# Patient Record
Sex: Male | Born: 1940 | Race: White | Hispanic: No | Smoking: Current every day smoker
Health system: Southern US, Community
[De-identification: ages and names within clinical notes are randomized; demographics above are authoritative.]

## PROBLEM LIST (undated history)

## (undated) DIAGNOSIS — K219 Gastro-esophageal reflux disease without esophagitis: Secondary | ICD-10-CM

## (undated) DIAGNOSIS — R1312 Dysphagia, oropharyngeal phase: Secondary | ICD-10-CM

## (undated) DIAGNOSIS — C801 Malignant (primary) neoplasm, unspecified: Secondary | ICD-10-CM

## (undated) DIAGNOSIS — M6281 Muscle weakness (generalized): Secondary | ICD-10-CM

## (undated) DIAGNOSIS — F79 Unspecified intellectual disabilities: Secondary | ICD-10-CM

## (undated) DIAGNOSIS — C649 Malignant neoplasm of unspecified kidney, except renal pelvis: Secondary | ICD-10-CM

## (undated) DIAGNOSIS — F32A Depression, unspecified: Secondary | ICD-10-CM

## (undated) DIAGNOSIS — J449 Chronic obstructive pulmonary disease, unspecified: Secondary | ICD-10-CM

## (undated) DIAGNOSIS — D649 Anemia, unspecified: Secondary | ICD-10-CM

## (undated) DIAGNOSIS — N289 Disorder of kidney and ureter, unspecified: Secondary | ICD-10-CM

## (undated) DIAGNOSIS — L409 Psoriasis, unspecified: Secondary | ICD-10-CM

## (undated) DIAGNOSIS — A419 Sepsis, unspecified organism: Secondary | ICD-10-CM

## (undated) DIAGNOSIS — IMO0001 Reserved for inherently not codable concepts without codable children: Secondary | ICD-10-CM

## (undated) DIAGNOSIS — N390 Urinary tract infection, site not specified: Secondary | ICD-10-CM

## (undated) DIAGNOSIS — M810 Age-related osteoporosis without current pathological fracture: Secondary | ICD-10-CM

## (undated) DIAGNOSIS — R262 Difficulty in walking, not elsewhere classified: Secondary | ICD-10-CM

## (undated) DIAGNOSIS — Z931 Gastrostomy status: Secondary | ICD-10-CM

## (undated) DIAGNOSIS — F329 Major depressive disorder, single episode, unspecified: Secondary | ICD-10-CM

## (undated) HISTORY — DX: Difficulty in walking, not elsewhere classified: R26.2

## (undated) HISTORY — DX: Muscle weakness (generalized): M62.81

## (undated) HISTORY — PX: PEG PLACEMENT: SHX5437

## (undated) HISTORY — DX: Gastro-esophageal reflux disease without esophagitis: K21.9

## (undated) HISTORY — DX: Reserved for inherently not codable concepts without codable children: IMO0001

## (undated) HISTORY — DX: Dysphagia, oropharyngeal phase: R13.12

---

## 2004-12-30 ENCOUNTER — Ambulatory Visit: Payer: Self-pay | Admitting: General Surgery

## 2004-12-30 ENCOUNTER — Other Ambulatory Visit: Payer: Self-pay

## 2005-01-03 ENCOUNTER — Ambulatory Visit: Payer: Self-pay | Admitting: General Surgery

## 2005-08-04 ENCOUNTER — Ambulatory Visit: Payer: Self-pay | Admitting: Unknown Physician Specialty

## 2006-06-09 ENCOUNTER — Inpatient Hospital Stay: Payer: Self-pay | Admitting: Internal Medicine

## 2006-06-09 ENCOUNTER — Other Ambulatory Visit: Payer: Self-pay

## 2011-01-02 ENCOUNTER — Inpatient Hospital Stay: Payer: Self-pay | Admitting: Internal Medicine

## 2011-02-13 ENCOUNTER — Observation Stay: Payer: Self-pay | Admitting: Internal Medicine

## 2011-04-10 ENCOUNTER — Emergency Department: Payer: Self-pay | Admitting: Emergency Medicine

## 2013-02-28 ENCOUNTER — Inpatient Hospital Stay: Payer: Self-pay | Admitting: Internal Medicine

## 2013-02-28 LAB — CBC
HCT: 40.1 % (ref 40.0–52.0)
HGB: 13.5 g/dL (ref 13.0–18.0)
MCH: 31.3 pg (ref 26.0–34.0)
MCHC: 33.6 g/dL (ref 32.0–36.0)
Platelet: 223 10*3/uL (ref 150–440)
RBC: 4.3 10*6/uL — ABNORMAL LOW (ref 4.40–5.90)

## 2013-02-28 LAB — COMPREHENSIVE METABOLIC PANEL
Albumin: 3.4 g/dL (ref 3.4–5.0)
Alkaline Phosphatase: 133 U/L (ref 50–136)
Anion Gap: 5 — ABNORMAL LOW (ref 7–16)
BUN: 23 mg/dL — ABNORMAL HIGH (ref 7–18)
Calcium, Total: 8.9 mg/dL (ref 8.5–10.1)
Chloride: 101 mmol/L (ref 98–107)
Co2: 28 mmol/L (ref 21–32)
Creatinine: 1.7 mg/dL — ABNORMAL HIGH (ref 0.60–1.30)
EGFR (African American): 46 — ABNORMAL LOW
Potassium: 3.2 mmol/L — ABNORMAL LOW (ref 3.5–5.1)
SGOT(AST): 18 U/L (ref 15–37)
SGPT (ALT): 11 U/L — ABNORMAL LOW (ref 12–78)
Sodium: 134 mmol/L — ABNORMAL LOW (ref 136–145)

## 2013-02-28 LAB — BASIC METABOLIC PANEL
Calcium, Total: 8.3 mg/dL — ABNORMAL LOW (ref 8.5–10.1)
Creatinine: 1.47 mg/dL — ABNORMAL HIGH (ref 0.60–1.30)
EGFR (African American): 55 — ABNORMAL LOW
EGFR (Non-African Amer.): 48 — ABNORMAL LOW
Potassium: 3.8 mmol/L (ref 3.5–5.1)

## 2013-02-28 LAB — URINALYSIS, COMPLETE
Specific Gravity: 1.018 (ref 1.003–1.030)
Squamous Epithelial: NONE SEEN

## 2013-02-28 LAB — HEMOGLOBIN: HGB: 12.3 g/dL — ABNORMAL LOW (ref 13.0–18.0)

## 2013-02-28 LAB — PROTIME-INR: INR: 1

## 2013-02-28 LAB — APTT: Activated PTT: 38.5 secs — ABNORMAL HIGH (ref 23.6–35.9)

## 2013-03-01 ENCOUNTER — Ambulatory Visit: Payer: Self-pay

## 2013-03-01 LAB — BASIC METABOLIC PANEL
BUN: 17 mg/dL (ref 7–18)
Calcium, Total: 8.4 mg/dL — ABNORMAL LOW (ref 8.5–10.1)
Chloride: 101 mmol/L (ref 98–107)
Co2: 28 mmol/L (ref 21–32)
Creatinine: 1.2 mg/dL (ref 0.60–1.30)
EGFR (African American): >60
Glucose: 98 mg/dL (ref 65–99)
Osmolality: 273 (ref 275–301)
Potassium: 3.4 mmol/L — ABNORMAL LOW (ref 3.5–5.1)

## 2013-03-01 LAB — CBC WITH DIFFERENTIAL/PLATELET
Basophil #: 0.1 10*3/uL (ref 0.0–0.1)
Basophil %: 0.4 %
Eosinophil #: 0.1 10*3/uL (ref 0.0–0.7)
Eosinophil %: 1 %
HCT: 36.8 % — ABNORMAL LOW (ref 40.0–52.0)
Lymphocyte %: 11.2 %
MCH: 31.4 pg (ref 26.0–34.0)
MCHC: 33.3 g/dL (ref 32.0–36.0)
MCV: 94 fL (ref 80–100)
Monocyte %: 9.2 %
Neutrophil #: 10.7 10*3/uL — ABNORMAL HIGH (ref 1.4–6.5)
Platelet: 195 10*3/uL (ref 150–440)
RBC: 3.91 10*6/uL — ABNORMAL LOW (ref 4.40–5.90)

## 2013-03-02 LAB — CBC WITH DIFFERENTIAL/PLATELET
Basophil #: 0.1 10*3/uL (ref 0.0–0.1)
HCT: 35.5 % — ABNORMAL LOW (ref 40.0–52.0)
HGB: 12 g/dL — ABNORMAL LOW (ref 13.0–18.0)
Lymphocyte #: 1.3 10*3/uL (ref 1.0–3.6)
Lymphocyte %: 12.4 %
MCH: 31.7 pg (ref 26.0–34.0)
MCV: 94 fL (ref 80–100)
Monocyte #: 1 x10 3/mm (ref 0.2–1.0)
Monocyte %: 9.3 %
Neutrophil #: 8.1 10*3/uL — ABNORMAL HIGH (ref 1.4–6.5)
Platelet: 217 10*3/uL (ref 150–440)
RDW: 16.3 % — ABNORMAL HIGH (ref 11.5–14.5)

## 2013-03-02 LAB — BASIC METABOLIC PANEL
Anion Gap: 8 (ref 7–16)
BUN: 16 mg/dL (ref 7–18)
Calcium, Total: 8.3 mg/dL — ABNORMAL LOW (ref 8.5–10.1)
Chloride: 106 mmol/L (ref 98–107)
Co2: 23 mmol/L (ref 21–32)
Creatinine: 1.1 mg/dL (ref 0.60–1.30)
EGFR (Non-African Amer.): >60
Glucose: 106 mg/dL — ABNORMAL HIGH (ref 65–99)
Potassium: 3.4 mmol/L — ABNORMAL LOW (ref 3.5–5.1)
Sodium: 137 mmol/L (ref 136–145)

## 2013-03-02 LAB — URINE CULTURE

## 2013-03-02 LAB — OCCULT BLOOD X 1 CARD TO LAB, STOOL: Occult Blood, Feces: NEGATIVE

## 2013-06-21 ENCOUNTER — Other Ambulatory Visit: Payer: Self-pay | Admitting: Family Medicine

## 2013-06-21 LAB — SEDIMENTATION RATE: Erythrocyte Sed Rate: 66 mm/hr — ABNORMAL HIGH (ref 0–20)

## 2013-06-26 ENCOUNTER — Encounter: Payer: Self-pay | Admitting: Podiatry

## 2013-07-11 ENCOUNTER — Encounter: Payer: Self-pay | Admitting: Podiatry

## 2013-07-14 ENCOUNTER — Other Ambulatory Visit: Payer: Self-pay | Admitting: Family Medicine

## 2013-07-14 ENCOUNTER — Ambulatory Visit (INDEPENDENT_AMBULATORY_CARE_PROVIDER_SITE_OTHER): Payer: Medicare Other | Admitting: Podiatry

## 2013-07-14 ENCOUNTER — Encounter: Payer: Self-pay | Admitting: Podiatry

## 2013-07-14 VITALS — BP 104/62 | HR 96 | Resp 16 | Ht 71.0 in

## 2013-07-14 DIAGNOSIS — B351 Tinea unguium: Secondary | ICD-10-CM

## 2013-07-14 DIAGNOSIS — M79609 Pain in unspecified limb: Secondary | ICD-10-CM

## 2013-07-14 LAB — CBC WITH DIFFERENTIAL/PLATELET
BASOS PCT: 1.1 %
Basophil #: 0.1 10*3/uL (ref 0.0–0.1)
EOS ABS: 0.4 10*3/uL (ref 0.0–0.7)
Eosinophil %: 3.6 %
HCT: 29.7 % — AB (ref 40.0–52.0)
HGB: 9.5 g/dL — AB (ref 13.0–18.0)
LYMPHS ABS: 2.2 10*3/uL (ref 1.0–3.6)
LYMPHS PCT: 18.2 %
MCH: 28.5 pg (ref 26.0–34.0)
MCHC: 32.2 g/dL (ref 32.0–36.0)
MCV: 89 fL (ref 80–100)
MONO ABS: 0.8 x10 3/mm (ref 0.2–1.0)
Monocyte %: 6.7 %
Neutrophil #: 8.4 10*3/uL — ABNORMAL HIGH (ref 1.4–6.5)
Neutrophil %: 70.4 %
Platelet: 348 10*3/uL (ref 150–440)
RBC: 3.34 10*6/uL — ABNORMAL LOW (ref 4.40–5.90)
RDW: 17.3 % — ABNORMAL HIGH (ref 11.5–14.5)
WBC: 11.9 10*3/uL — ABNORMAL HIGH (ref 3.8–10.6)

## 2013-07-14 LAB — COMPREHENSIVE METABOLIC PANEL
ALBUMIN: 2.7 g/dL — AB (ref 3.4–5.0)
ALK PHOS: 133 U/L — AB
Anion Gap: 3 — ABNORMAL LOW (ref 7–16)
BILIRUBIN TOTAL: 0.4 mg/dL (ref 0.2–1.0)
BUN: 33 mg/dL — AB (ref 7–18)
CHLORIDE: 102 mmol/L (ref 98–107)
CO2: 30 mmol/L (ref 21–32)
CREATININE: 1.16 mg/dL (ref 0.60–1.30)
Calcium, Total: 8.3 mg/dL — ABNORMAL LOW (ref 8.5–10.1)
EGFR (African American): >60
GLUCOSE: 84 mg/dL (ref 65–99)
OSMOLALITY: 277 (ref 275–301)
Potassium: 4.3 mmol/L (ref 3.5–5.1)
SGOT(AST): 17 U/L (ref 15–37)
SGPT (ALT): 19 U/L (ref 12–78)
Sodium: 135 mmol/L — ABNORMAL LOW (ref 136–145)
Total Protein: 6.7 g/dL (ref 6.4–8.2)

## 2013-07-14 LAB — SEDIMENTATION RATE: Erythrocyte Sed Rate: 87 mm/hr — ABNORMAL HIGH (ref 0–20)

## 2013-07-14 NOTE — Progress Notes (Signed)
He presents today with a chief complaint of painful elongated toenails bilateral.  Objective: Vital signs are stable he is alert and oriented x3. Pulses are palpable bilateral. Nails are thick yellow dystrophic with mycotic and painful palpation.  Assessment: Pain in limb secondary to onychomycosis 1 through 5 bilateral.  Plan: Debridement of nails 1 through 5 bilateral covered service secondary to pain.

## 2013-08-21 ENCOUNTER — Emergency Department: Payer: Self-pay | Admitting: Emergency Medicine

## 2013-08-21 LAB — COMPREHENSIVE METABOLIC PANEL
ALK PHOS: 108 U/L
ALT: 14 U/L (ref 12–78)
Albumin: 2.6 g/dL — ABNORMAL LOW (ref 3.4–5.0)
Anion Gap: 2 — ABNORMAL LOW (ref 7–16)
BUN: 42 mg/dL — ABNORMAL HIGH (ref 7–18)
Bilirubin,Total: 0.5 mg/dL (ref 0.2–1.0)
CHLORIDE: 111 mmol/L — AB (ref 98–107)
CO2: 29 mmol/L (ref 21–32)
CREATININE: 1.17 mg/dL (ref 0.60–1.30)
Calcium, Total: 8.9 mg/dL (ref 8.5–10.1)
EGFR (African American): >60
EGFR (Non-African Amer.): >60
Glucose: 99 mg/dL (ref 65–99)
Osmolality: 294 (ref 275–301)
POTASSIUM: 4.1 mmol/L (ref 3.5–5.1)
SGOT(AST): 15 U/L (ref 15–37)
Sodium: 142 mmol/L (ref 136–145)
Total Protein: 7.1 g/dL (ref 6.4–8.2)

## 2013-08-21 LAB — PROTIME-INR
INR: 1.1
PROTHROMBIN TIME: 14.5 s (ref 11.5–14.7)

## 2013-08-21 LAB — CBC
HCT: 31.4 % — AB (ref 40.0–52.0)
HGB: 10 g/dL — ABNORMAL LOW (ref 13.0–18.0)
MCH: 27.9 pg (ref 26.0–34.0)
MCHC: 31.7 g/dL — ABNORMAL LOW (ref 32.0–36.0)
MCV: 88 fL (ref 80–100)
Platelet: 216 10*3/uL (ref 150–440)
RBC: 3.57 10*6/uL — AB (ref 4.40–5.90)
RDW: 16.4 % — ABNORMAL HIGH (ref 11.5–14.5)
WBC: 13.1 10*3/uL — ABNORMAL HIGH (ref 3.8–10.6)

## 2013-08-23 ENCOUNTER — Inpatient Hospital Stay: Payer: Self-pay | Admitting: Specialist

## 2013-08-23 LAB — CBC
HCT: 30.6 % — AB (ref 40.0–52.0)
HGB: 9.6 g/dL — ABNORMAL LOW (ref 13.0–18.0)
MCH: 27.4 pg (ref 26.0–34.0)
MCHC: 31.5 g/dL — ABNORMAL LOW (ref 32.0–36.0)
MCV: 87 fL (ref 80–100)
Platelet: 235 10*3/uL (ref 150–440)
RBC: 3.52 10*6/uL — ABNORMAL LOW (ref 4.40–5.90)
RDW: 16.7 % — ABNORMAL HIGH (ref 11.5–14.5)
WBC: 11.2 10*3/uL — ABNORMAL HIGH (ref 3.8–10.6)

## 2013-08-23 LAB — URINALYSIS, COMPLETE
Bilirubin,UR: NEGATIVE
Blood: NEGATIVE
Glucose,UR: NEGATIVE mg/dL (ref 0–75)
Nitrite: NEGATIVE
Ph: 5 (ref 4.5–8.0)
Protein: 100
RBC,UR: 1315 /HPF (ref 0–5)
Specific Gravity: 1.018 (ref 1.003–1.030)
Squamous Epithelial: NONE SEEN
WBC UR: 10568 /HPF (ref 0–5)

## 2013-08-23 LAB — BASIC METABOLIC PANEL
ANION GAP: 5 — AB (ref 7–16)
BUN: 39 mg/dL — ABNORMAL HIGH (ref 7–18)
CO2: 26 mmol/L (ref 21–32)
Calcium, Total: 8.6 mg/dL (ref 8.5–10.1)
Chloride: 111 mmol/L — ABNORMAL HIGH (ref 98–107)
Creatinine: 1.12 mg/dL (ref 0.60–1.30)
EGFR (Non-African Amer.): >60
GLUCOSE: 104 mg/dL — AB (ref 65–99)
OSMOLALITY: 293 (ref 275–301)
POTASSIUM: 3.8 mmol/L (ref 3.5–5.1)
SODIUM: 142 mmol/L (ref 136–145)

## 2013-08-24 ENCOUNTER — Ambulatory Visit: Payer: Self-pay | Admitting: Urology

## 2013-08-24 LAB — MAGNESIUM: Magnesium: 2 mg/dL

## 2013-08-24 LAB — CBC WITH DIFFERENTIAL/PLATELET
BASOS ABS: 0.1 10*3/uL (ref 0.0–0.1)
Basophil %: 0.7 %
Eosinophil #: 0.7 10*3/uL (ref 0.0–0.7)
Eosinophil %: 7.9 %
HCT: 28.6 % — ABNORMAL LOW (ref 40.0–52.0)
HGB: 9 g/dL — ABNORMAL LOW (ref 13.0–18.0)
Lymphocyte #: 2.3 10*3/uL (ref 1.0–3.6)
Lymphocyte %: 24.5 %
MCH: 27.4 pg (ref 26.0–34.0)
MCHC: 31.5 g/dL — ABNORMAL LOW (ref 32.0–36.0)
MCV: 87 fL (ref 80–100)
MONOS PCT: 5.8 %
Monocyte #: 0.5 x10 3/mm (ref 0.2–1.0)
NEUTROS PCT: 61.1 %
Neutrophil #: 5.7 10*3/uL (ref 1.4–6.5)
Platelet: 220 10*3/uL (ref 150–440)
RBC: 3.29 10*6/uL — ABNORMAL LOW (ref 4.40–5.90)
RDW: 16.8 % — ABNORMAL HIGH (ref 11.5–14.5)
WBC: 9.3 10*3/uL (ref 3.8–10.6)

## 2013-08-24 LAB — BASIC METABOLIC PANEL
ANION GAP: 5 — AB (ref 7–16)
BUN: 30 mg/dL — ABNORMAL HIGH (ref 7–18)
CALCIUM: 8.1 mg/dL — AB (ref 8.5–10.1)
CHLORIDE: 113 mmol/L — AB (ref 98–107)
Co2: 26 mmol/L (ref 21–32)
Creatinine: 0.9 mg/dL (ref 0.60–1.30)
EGFR (Non-African Amer.): >60
Glucose: 91 mg/dL (ref 65–99)
OSMOLALITY: 293 (ref 275–301)
Potassium: 3.7 mmol/L (ref 3.5–5.1)
SODIUM: 144 mmol/L (ref 136–145)

## 2013-08-25 LAB — URINE CULTURE

## 2013-08-28 LAB — CULTURE, BLOOD (SINGLE)

## 2013-10-09 ENCOUNTER — Ambulatory Visit: Payer: Self-pay | Admitting: Internal Medicine

## 2013-10-25 ENCOUNTER — Emergency Department: Payer: Self-pay | Admitting: Emergency Medicine

## 2014-01-05 ENCOUNTER — Ambulatory Visit: Payer: Medicare Other | Admitting: Podiatry

## 2014-02-05 ENCOUNTER — Encounter: Payer: Self-pay | Admitting: Podiatry

## 2014-02-05 ENCOUNTER — Ambulatory Visit (INDEPENDENT_AMBULATORY_CARE_PROVIDER_SITE_OTHER): Payer: Commercial Managed Care - HMO | Admitting: Podiatry

## 2014-02-05 DIAGNOSIS — B351 Tinea unguium: Secondary | ICD-10-CM | POA: Diagnosis not present

## 2014-02-05 DIAGNOSIS — M79609 Pain in unspecified limb: Secondary | ICD-10-CM | POA: Diagnosis not present

## 2014-02-05 DIAGNOSIS — M79676 Pain in unspecified toe(s): Secondary | ICD-10-CM

## 2014-02-05 NOTE — Progress Notes (Signed)
   Subjective:    Patient ID: Drago Hammonds, male    DOB: 11-01-1940, 73 y.o.   MRN: [id number]  HPI Comments: Presents today chief complaint of painful elongated toenails.  Objective: Pulses are palpable bilateral nails are thick, yellow dystrophic onychomycosis and painful palpation.   Assessment: Onychomycosis with pain in limb.  Plan: Treatment of nails in thickness and length as covered service secondary to pain.      Review of Systems     Objective:   Physical Exam        Assessment & Plan:

## 2014-02-09 ENCOUNTER — Inpatient Hospital Stay: Payer: Self-pay | Admitting: Internal Medicine

## 2014-02-09 LAB — COMPREHENSIVE METABOLIC PANEL
ALBUMIN: 3.8 g/dL (ref 3.4–5.0)
ALK PHOS: 139 U/L — AB
ALT: 20 U/L
AST: 17 U/L (ref 15–37)
Anion Gap: 9 (ref 7–16)
BUN: 39 mg/dL — ABNORMAL HIGH (ref 7–18)
Bilirubin,Total: 0.7 mg/dL (ref 0.2–1.0)
CHLORIDE: 105 mmol/L (ref 98–107)
Calcium, Total: 9.6 mg/dL (ref 8.5–10.1)
Co2: 24 mmol/L (ref 21–32)
Creatinine: 1.55 mg/dL — ABNORMAL HIGH (ref 0.60–1.30)
EGFR (African American): 57 — ABNORMAL LOW
EGFR (Non-African Amer.): 47 — ABNORMAL LOW
Glucose: 119 mg/dL — ABNORMAL HIGH (ref 65–99)
OSMOLALITY: 286 (ref 275–301)
POTASSIUM: 4.4 mmol/L (ref 3.5–5.1)
Sodium: 138 mmol/L (ref 136–145)
Total Protein: 8.3 g/dL — ABNORMAL HIGH (ref 6.4–8.2)

## 2014-02-09 LAB — CBC WITH DIFFERENTIAL/PLATELET
BASOS ABS: 0.1 10*3/uL (ref 0.0–0.1)
Basophil %: 0.4 %
EOS PCT: 0.3 %
Eosinophil #: 0 10*3/uL (ref 0.0–0.7)
HCT: 47.5 % (ref 40.0–52.0)
HGB: 15 g/dL (ref 13.0–18.0)
LYMPHS ABS: 1.3 10*3/uL (ref 1.0–3.6)
LYMPHS PCT: 9.3 %
MCH: 31 pg (ref 26.0–34.0)
MCHC: 31.5 g/dL — ABNORMAL LOW (ref 32.0–36.0)
MCV: 99 fL (ref 80–100)
MONOS PCT: 4 %
Monocyte #: 0.6 x10 3/mm (ref 0.2–1.0)
Neutrophil #: 12.2 10*3/uL — ABNORMAL HIGH (ref 1.4–6.5)
Neutrophil %: 86 %
PLATELETS: 188 10*3/uL (ref 150–440)
RBC: 4.83 10*6/uL (ref 4.40–5.90)
RDW: 13.9 % (ref 11.5–14.5)
WBC: 14.2 10*3/uL — AB (ref 3.8–10.6)

## 2014-02-09 LAB — URINALYSIS, COMPLETE
Bilirubin,UR: NEGATIVE
GLUCOSE, UR: NEGATIVE mg/dL (ref 0–75)
KETONE: NEGATIVE
Nitrite: NEGATIVE
Ph: 7 (ref 4.5–8.0)
RBC,UR: 3006 /HPF (ref 0–5)
SPECIFIC GRAVITY: 1.016 (ref 1.003–1.030)
Squamous Epithelial: NONE SEEN

## 2014-02-09 LAB — PHOSPHORUS: PHOSPHORUS: 2.7 mg/dL (ref 2.5–4.9)

## 2014-02-09 LAB — PROTIME-INR
INR: 1
PROTHROMBIN TIME: 13.3 s (ref 11.5–14.7)

## 2014-02-09 LAB — MAGNESIUM: Magnesium: 1.9 mg/dL

## 2014-02-09 LAB — TROPONIN I

## 2014-02-10 LAB — CBC WITH DIFFERENTIAL/PLATELET
Basophil #: 0.1 10*3/uL (ref 0.0–0.1)
Basophil %: 0.6 %
Eosinophil #: 0.2 10*3/uL (ref 0.0–0.7)
Eosinophil %: 2.3 %
HCT: 38.7 % — ABNORMAL LOW (ref 40.0–52.0)
HGB: 13 g/dL (ref 13.0–18.0)
LYMPHS ABS: 1.3 10*3/uL (ref 1.0–3.6)
LYMPHS PCT: 12.3 %
MCH: 32.8 pg (ref 26.0–34.0)
MCHC: 33.6 g/dL (ref 32.0–36.0)
MCV: 98 fL (ref 80–100)
Monocyte #: 0.8 x10 3/mm (ref 0.2–1.0)
Monocyte %: 7.4 %
Neutrophil #: 8 10*3/uL — ABNORMAL HIGH (ref 1.4–6.5)
Neutrophil %: 77.4 %
PLATELETS: 147 10*3/uL — AB (ref 150–440)
RBC: 3.96 10*6/uL — ABNORMAL LOW (ref 4.40–5.90)
RDW: 13.8 % (ref 11.5–14.5)
WBC: 10.3 10*3/uL (ref 3.8–10.6)

## 2014-02-10 LAB — BASIC METABOLIC PANEL
ANION GAP: 6 — AB (ref 7–16)
BUN: 28 mg/dL — ABNORMAL HIGH (ref 7–18)
Calcium, Total: 8.1 mg/dL — ABNORMAL LOW (ref 8.5–10.1)
Chloride: 112 mmol/L — ABNORMAL HIGH (ref 98–107)
Co2: 25 mmol/L (ref 21–32)
Creatinine: 1.16 mg/dL (ref 0.60–1.30)
EGFR (Non-African Amer.): >60
Glucose: 91 mg/dL (ref 65–99)
OSMOLALITY: 290 (ref 275–301)
Potassium: 3.7 mmol/L (ref 3.5–5.1)
Sodium: 143 mmol/L (ref 136–145)

## 2014-02-11 LAB — CBC WITH DIFFERENTIAL/PLATELET
BASOS PCT: 0.8 %
Basophil #: 0.1 10*3/uL (ref 0.0–0.1)
EOS ABS: 0.4 10*3/uL (ref 0.0–0.7)
Eosinophil %: 5.2 %
HCT: 37.9 % — AB (ref 40.0–52.0)
HGB: 12.8 g/dL — ABNORMAL LOW (ref 13.0–18.0)
LYMPHS ABS: 1.6 10*3/uL (ref 1.0–3.6)
Lymphocyte %: 19.3 %
MCH: 32.7 pg (ref 26.0–34.0)
MCHC: 33.8 g/dL (ref 32.0–36.0)
MCV: 97 fL (ref 80–100)
Monocyte #: 0.6 x10 3/mm (ref 0.2–1.0)
Monocyte %: 7.7 %
NEUTROS ABS: 5.6 10*3/uL (ref 1.4–6.5)
Neutrophil %: 67 %
Platelet: 135 10*3/uL — ABNORMAL LOW (ref 150–440)
RBC: 3.91 10*6/uL — ABNORMAL LOW (ref 4.40–5.90)
RDW: 13.7 % (ref 11.5–14.5)
WBC: 8.3 10*3/uL (ref 3.8–10.6)

## 2014-02-11 LAB — BASIC METABOLIC PANEL
Anion Gap: 7 (ref 7–16)
BUN: 27 mg/dL — AB (ref 7–18)
CREATININE: 1.05 mg/dL (ref 0.60–1.30)
Calcium, Total: 8 mg/dL — ABNORMAL LOW (ref 8.5–10.1)
Chloride: 110 mmol/L — ABNORMAL HIGH (ref 98–107)
Co2: 22 mmol/L (ref 21–32)
Glucose: 92 mg/dL (ref 65–99)
Osmolality: 282 (ref 275–301)
Potassium: 3.8 mmol/L (ref 3.5–5.1)
Sodium: 139 mmol/L (ref 136–145)

## 2014-02-11 LAB — URINE CULTURE

## 2014-02-14 LAB — CULTURE, BLOOD (SINGLE)

## 2014-03-05 ENCOUNTER — Emergency Department: Payer: Self-pay | Admitting: Emergency Medicine

## 2014-03-05 LAB — CBC WITH DIFFERENTIAL/PLATELET
Basophil #: 0.1 10*3/uL (ref 0.0–0.1)
Basophil %: 0.4 %
Eosinophil #: 0.1 10*3/uL (ref 0.0–0.7)
Eosinophil %: 0.7 %
HCT: 41.4 % (ref 40.0–52.0)
HGB: 13.6 g/dL (ref 13.0–18.0)
LYMPHS ABS: 1 10*3/uL (ref 1.0–3.6)
LYMPHS PCT: 7.6 %
MCH: 32.2 pg (ref 26.0–34.0)
MCHC: 32.9 g/dL (ref 32.0–36.0)
MCV: 98 fL (ref 80–100)
Monocyte #: 1 x10 3/mm (ref 0.2–1.0)
Monocyte %: 7.6 %
NEUTROS PCT: 83.7 %
Neutrophil #: 11.5 10*3/uL — ABNORMAL HIGH (ref 1.4–6.5)
PLATELETS: 168 10*3/uL (ref 150–440)
RBC: 4.22 10*6/uL — AB (ref 4.40–5.90)
RDW: 13.9 % (ref 11.5–14.5)
WBC: 13.7 10*3/uL — ABNORMAL HIGH (ref 3.8–10.6)

## 2014-03-05 LAB — BASIC METABOLIC PANEL
ANION GAP: 10 (ref 7–16)
BUN: 40 mg/dL — ABNORMAL HIGH (ref 7–18)
CHLORIDE: 111 mmol/L — AB (ref 98–107)
CO2: 25 mmol/L (ref 21–32)
CREATININE: 1.6 mg/dL — AB (ref 0.60–1.30)
Calcium, Total: 9.6 mg/dL (ref 8.5–10.1)
EGFR (Non-African Amer.): 46 — ABNORMAL LOW
GFR CALC AF AMER: 55 — AB
GLUCOSE: 117 mg/dL — AB (ref 65–99)
OSMOLALITY: 301 (ref 275–301)
Potassium: 4 mmol/L (ref 3.5–5.1)
SODIUM: 146 mmol/L — AB (ref 136–145)

## 2014-03-05 LAB — URINALYSIS, COMPLETE
BILIRUBIN, UR: NEGATIVE
Blood: NEGATIVE
GLUCOSE, UR: NEGATIVE mg/dL (ref 0–75)
Ketone: NEGATIVE
NITRITE: NEGATIVE
PH: 7 (ref 4.5–8.0)
Protein: 30
SPECIFIC GRAVITY: 1.015 (ref 1.003–1.030)
SQUAMOUS EPITHELIAL: NONE SEEN
WBC UR: 98 /HPF (ref 0–5)

## 2014-05-06 ENCOUNTER — Other Ambulatory Visit: Payer: Medicare Other

## 2014-05-06 ENCOUNTER — Ambulatory Visit: Payer: Medicare Other | Admitting: Podiatry

## 2014-05-13 ENCOUNTER — Emergency Department: Payer: Self-pay | Admitting: Internal Medicine

## 2014-05-13 LAB — COMPREHENSIVE METABOLIC PANEL
ANION GAP: 5 — AB (ref 7–16)
Albumin: 3.1 g/dL — ABNORMAL LOW (ref 3.4–5.0)
Alkaline Phosphatase: 117 U/L — ABNORMAL HIGH
BILIRUBIN TOTAL: 0.5 mg/dL (ref 0.2–1.0)
BUN: 37 mg/dL — AB (ref 7–18)
CALCIUM: 9 mg/dL (ref 8.5–10.1)
CO2: 29 mmol/L (ref 21–32)
CREATININE: 1.18 mg/dL (ref 0.60–1.30)
Chloride: 109 mmol/L — ABNORMAL HIGH (ref 98–107)
EGFR (Non-African Amer.): >60
Glucose: 99 mg/dL (ref 65–99)
OSMOLALITY: 294 (ref 275–301)
POTASSIUM: 4.2 mmol/L (ref 3.5–5.1)
SGOT(AST): 19 U/L (ref 15–37)
SGPT (ALT): 18 U/L
Sodium: 143 mmol/L (ref 136–145)
TOTAL PROTEIN: 7.3 g/dL (ref 6.4–8.2)

## 2014-05-13 LAB — PROTIME-INR
INR: 1
Prothrombin Time: 12.9 secs (ref 11.5–14.7)

## 2014-05-13 LAB — URINALYSIS, COMPLETE
Bilirubin,UR: NEGATIVE
Glucose,UR: NEGATIVE mg/dL (ref 0–75)
Ketone: NEGATIVE
NITRITE: NEGATIVE
Ph: 7 (ref 4.5–8.0)
Protein: 100
RBC,UR: 7724 /HPF (ref 0–5)
Specific Gravity: 1.019 (ref 1.003–1.030)
WBC UR: 188 /HPF (ref 0–5)

## 2014-05-13 LAB — CBC
HCT: 42.6 % (ref 40.0–52.0)
HGB: 14 g/dL (ref 13.0–18.0)
MCH: 33.3 pg (ref 26.0–34.0)
MCHC: 32.8 g/dL (ref 32.0–36.0)
MCV: 102 fL — AB (ref 80–100)
Platelet: 189 10*3/uL (ref 150–440)
RBC: 4.19 10*6/uL — ABNORMAL LOW (ref 4.40–5.90)
RDW: 13.4 % (ref 11.5–14.5)
WBC: 9.6 10*3/uL (ref 3.8–10.6)

## 2014-05-25 ENCOUNTER — Other Ambulatory Visit: Payer: Medicare Other

## 2014-06-10 ENCOUNTER — Emergency Department: Payer: Self-pay | Admitting: Emergency Medicine

## 2014-06-10 LAB — URINALYSIS, COMPLETE
BILIRUBIN, UR: NEGATIVE
Glucose,UR: NEGATIVE mg/dL (ref 0–75)
KETONE: NEGATIVE
Nitrite: NEGATIVE
PH: 6 (ref 4.5–8.0)
Protein: 30
RBC,UR: 4 /HPF (ref 0–5)
Specific Gravity: 1.009 (ref 1.003–1.030)
Squamous Epithelial: NONE SEEN
WBC UR: 57 /HPF (ref 0–5)

## 2014-06-12 LAB — URINE CULTURE

## 2014-06-14 ENCOUNTER — Inpatient Hospital Stay: Payer: Self-pay | Admitting: Internal Medicine

## 2014-06-14 LAB — COMPREHENSIVE METABOLIC PANEL
Albumin: 3.2 g/dL — ABNORMAL LOW (ref 3.4–5.0)
Alkaline Phosphatase: 116 U/L (ref 46–116)
Anion Gap: 9 (ref 7–16)
BILIRUBIN TOTAL: 0.6 mg/dL (ref 0.2–1.0)
BUN: 39 mg/dL — AB (ref 7–18)
CHLORIDE: 108 mmol/L — AB (ref 98–107)
CO2: 24 mmol/L (ref 21–32)
CREATININE: 1.57 mg/dL — AB (ref 0.60–1.30)
Calcium, Total: 9.5 mg/dL (ref 8.5–10.1)
GFR CALC AF AMER: 56 — AB
GFR CALC NON AF AMER: 46 — AB
Glucose: 110 mg/dL — ABNORMAL HIGH (ref 65–99)
OSMOLALITY: 291 (ref 275–301)
POTASSIUM: 3.9 mmol/L (ref 3.5–5.1)
SGOT(AST): 16 U/L (ref 15–37)
SGPT (ALT): 15 U/L (ref 14–63)
SODIUM: 141 mmol/L (ref 136–145)
Total Protein: 7.5 g/dL (ref 6.4–8.2)

## 2014-06-14 LAB — CBC
HCT: 41.8 % (ref 40.0–52.0)
HGB: 14 g/dL (ref 13.0–18.0)
MCH: 34.2 pg — ABNORMAL HIGH (ref 26.0–34.0)
MCHC: 33.6 g/dL (ref 32.0–36.0)
MCV: 102 fL — ABNORMAL HIGH (ref 80–100)
Platelet: 170 10*3/uL (ref 150–440)
RBC: 4.1 10*6/uL — ABNORMAL LOW (ref 4.40–5.90)
RDW: 13.7 % (ref 11.5–14.5)
WBC: 6.7 10*3/uL (ref 3.8–10.6)

## 2014-06-14 LAB — URINALYSIS, COMPLETE
BACTERIA: NONE SEEN
RBC,UR: 83340 /HPF (ref 0–5)
Specific Gravity: 1.02 (ref 1.003–1.030)
Squamous Epithelial: 14

## 2014-06-14 LAB — LIPASE, BLOOD: Lipase: 229 U/L (ref 73–393)

## 2014-06-14 LAB — TROPONIN I
TROPONIN-I: 1.3 ng/mL — AB
Troponin-I: 0.11 ng/mL — ABNORMAL HIGH

## 2014-06-14 LAB — HEMOGLOBIN: HGB: 11.6 g/dL — ABNORMAL LOW (ref 13.0–18.0)

## 2014-06-14 LAB — CK-MB: CK-MB: 2.3 ng/mL (ref 0.5–3.6)

## 2014-06-15 LAB — CBC WITH DIFFERENTIAL/PLATELET
BASOS ABS: 0.1 10*3/uL (ref 0.0–0.1)
Basophil %: 0.2 %
EOS ABS: 0.1 10*3/uL (ref 0.0–0.7)
Eosinophil %: 0.2 %
HCT: 33.3 % — ABNORMAL LOW (ref 40.0–52.0)
HGB: 11 g/dL — ABNORMAL LOW (ref 13.0–18.0)
LYMPHS ABS: 1.3 10*3/uL (ref 1.0–3.6)
Lymphocyte %: 4.7 %
MCH: 33.1 pg (ref 26.0–34.0)
MCHC: 32.9 g/dL (ref 32.0–36.0)
MCV: 101 fL — ABNORMAL HIGH (ref 80–100)
Monocyte #: 1.7 x10 3/mm — ABNORMAL HIGH (ref 0.2–1.0)
Monocyte %: 6.4 %
NEUTROS PCT: 88.5 %
Neutrophil #: 23.6 10*3/uL — ABNORMAL HIGH (ref 1.4–6.5)
PLATELETS: 146 10*3/uL — AB (ref 150–440)
RBC: 3.31 10*6/uL — ABNORMAL LOW (ref 4.40–5.90)
RDW: 13.9 % (ref 11.5–14.5)
WBC: 26.7 10*3/uL — AB (ref 3.8–10.6)

## 2014-06-15 LAB — BASIC METABOLIC PANEL
Anion Gap: 8 (ref 7–16)
BUN: 44 mg/dL — ABNORMAL HIGH (ref 7–18)
CHLORIDE: 115 mmol/L — AB (ref 98–107)
Calcium, Total: 8.2 mg/dL — ABNORMAL LOW (ref 8.5–10.1)
Co2: 23 mmol/L (ref 21–32)
Creatinine: 1.51 mg/dL — ABNORMAL HIGH (ref 0.60–1.30)
EGFR (African American): 59 — ABNORMAL LOW
GFR CALC NON AF AMER: 49 — AB
Glucose: 98 mg/dL (ref 65–99)
OSMOLALITY: 302 (ref 275–301)
Potassium: 4.3 mmol/L (ref 3.5–5.1)
Sodium: 146 mmol/L — ABNORMAL HIGH (ref 136–145)

## 2014-06-15 LAB — TROPONIN I: Troponin-I: 1.5 ng/mL — ABNORMAL HIGH

## 2014-06-15 LAB — CK-MB
CK-MB: 3.6 ng/mL (ref 0.5–3.6)
CK-MB: 3.7 ng/mL — AB (ref 0.5–3.6)

## 2014-06-16 LAB — CBC WITH DIFFERENTIAL/PLATELET
BASOS PCT: 0.3 %
Basophil #: 0 10*3/uL (ref 0.0–0.1)
EOS PCT: 1.6 %
Eosinophil #: 0.3 10*3/uL (ref 0.0–0.7)
HCT: 33.4 % — ABNORMAL LOW (ref 40.0–52.0)
HGB: 11.1 g/dL — AB (ref 13.0–18.0)
LYMPHS ABS: 1.2 10*3/uL (ref 1.0–3.6)
Lymphocyte %: 6.8 %
MCH: 33.6 pg (ref 26.0–34.0)
MCHC: 33.2 g/dL (ref 32.0–36.0)
MCV: 101 fL — AB (ref 80–100)
MONO ABS: 1 x10 3/mm (ref 0.2–1.0)
Monocyte %: 5.5 %
NEUTROS ABS: 15.4 10*3/uL — AB (ref 1.4–6.5)
Neutrophil %: 85.8 %
Platelet: 134 10*3/uL — ABNORMAL LOW (ref 150–440)
RBC: 3.3 10*6/uL — AB (ref 4.40–5.90)
RDW: 13.8 % (ref 11.5–14.5)
WBC: 18 10*3/uL — ABNORMAL HIGH (ref 3.8–10.6)

## 2014-06-16 LAB — BASIC METABOLIC PANEL
Anion Gap: 11 (ref 7–16)
BUN: 36 mg/dL — AB (ref 7–18)
CALCIUM: 8.2 mg/dL — AB (ref 8.5–10.1)
CHLORIDE: 112 mmol/L — AB (ref 98–107)
CO2: 22 mmol/L (ref 21–32)
Creatinine: 1.42 mg/dL — ABNORMAL HIGH (ref 0.60–1.30)
EGFR (Non-African Amer.): 52 — ABNORMAL LOW
Glucose: 87 mg/dL (ref 65–99)
Osmolality: 296 (ref 275–301)
Potassium: 4 mmol/L (ref 3.5–5.1)
SODIUM: 145 mmol/L (ref 136–145)

## 2014-06-17 LAB — CBC WITH DIFFERENTIAL/PLATELET
BASOS ABS: 0 10*3/uL (ref 0.0–0.1)
Basophil %: 0.3 %
Eosinophil #: 0.2 10*3/uL (ref 0.0–0.7)
Eosinophil %: 2.2 %
HCT: 34.2 % — ABNORMAL LOW (ref 40.0–52.0)
HGB: 11.7 g/dL — ABNORMAL LOW (ref 13.0–18.0)
LYMPHS ABS: 1.4 10*3/uL (ref 1.0–3.6)
Lymphocyte %: 13.1 %
MCH: 34.2 pg — AB (ref 26.0–34.0)
MCHC: 34.2 g/dL (ref 32.0–36.0)
MCV: 100 fL (ref 80–100)
Monocyte #: 0.7 x10 3/mm (ref 0.2–1.0)
Monocyte %: 6.2 %
NEUTROS ABS: 8.5 10*3/uL — AB (ref 1.4–6.5)
Neutrophil %: 78.2 %
PLATELETS: 149 10*3/uL — AB (ref 150–440)
RBC: 3.42 10*6/uL — ABNORMAL LOW (ref 4.40–5.90)
RDW: 13.4 % (ref 11.5–14.5)
WBC: 10.9 10*3/uL — ABNORMAL HIGH (ref 3.8–10.6)

## 2014-06-17 LAB — BASIC METABOLIC PANEL
Anion Gap: 9 (ref 7–16)
BUN: 26 mg/dL — AB (ref 7–18)
CHLORIDE: 112 mmol/L — AB (ref 98–107)
CREATININE: 1.24 mg/dL (ref 0.60–1.30)
Calcium, Total: 8.2 mg/dL — ABNORMAL LOW (ref 8.5–10.1)
Co2: 20 mmol/L — ABNORMAL LOW (ref 21–32)
GLUCOSE: 102 mg/dL — AB (ref 65–99)
Osmolality: 286 (ref 275–301)
POTASSIUM: 3.9 mmol/L (ref 3.5–5.1)
Sodium: 141 mmol/L (ref 136–145)

## 2014-07-06 ENCOUNTER — Ambulatory Visit: Payer: Self-pay | Admitting: Internal Medicine

## 2014-08-13 ENCOUNTER — Ambulatory Visit (INDEPENDENT_AMBULATORY_CARE_PROVIDER_SITE_OTHER): Payer: Commercial Managed Care - HMO | Admitting: Podiatry

## 2014-08-13 DIAGNOSIS — B351 Tinea unguium: Secondary | ICD-10-CM

## 2014-08-13 DIAGNOSIS — M79676 Pain in unspecified toe(s): Secondary | ICD-10-CM

## 2014-08-13 NOTE — Progress Notes (Signed)
Patient ID: Matthew Poole, male   DOB: 04/07/1941, 74 y.o.   MRN: [id number]  Subjective: 74 y.o.-year-old male returns the office today for painful, elongated, thickened toenails which he is unable to trim himself. Denies any redness or drainage around the nails. Denies any acute changes since last appointment and no new complaints today. Denies any systemic complaints such as fevers, chills, nausea, vomiting.   Objective: AAO 3, NAD DP/PT pulses palpable, CRT less than 3 seconds Protective sensation intact with Simms Weinstein monofilament, Achilles tendon reflex intact.  Nails hypertrophic, dystrophic, elongated, brittle, discolored 10. There is tenderness overlying the nails 1-5 bilaterally. There is no surrounding erythema or drainage along the nail sites. No open lesions or pre-ulcerative lesions are identified bilaterally No other areas of apparent tenderness bilateral lower extremities. No overlying edema, erythema, increased warmth. No pain with calf compression, swelling, warmth, erythema.  Assessment: Patient presents with symptomatic onychomycosis  Plan: -Treatment options including alternatives, risks, complications were discussed -Nails sharply debrided 10 without complication/bleeding. -Discussed daily foot inspection. If there are any changes, to call the office immediately.  -Follow-up in 3 months or sooner if any problems are to arise. In the meantime, encouraged to call the office with any questions, concerns, changes symptoms.

## 2014-08-13 NOTE — Addendum Note (Signed)
Addended by: Rip Harbour on: 08/13/2014 10:38 AM   Modules accepted: Orders, Medications

## 2014-09-04 NOTE — Consult Note (Signed)
Reason for consultation:  Gross hematuria, right mass seen on renal ultrasound  74 yo caucasian male who lives in a group home was brought to the emergency room because of blood found on his sheets.  It was unclear whether this was GI or GU in origin.  He was evaluated in the ER and a foley catheter was placed for gross hematuria.  Patient had a renal ultrasound that demonstrated a right perihilar mass concerning for malignancy.  Urology was consulted.  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    the patient did respond to questions with yes/no answers.  He denies pain, nausea, vomiting, constipation, diarrhea.  He denies fevers, chillscould not ascertain if he has had recent weight loss. He denies difficulty with ambulation. He denies changes in appetite.  per chart and current H&P 1.  History of intellectual disability.  Osteoporosis.  Depression.  Anxiety.  GERD.  History of renal insufficiency, but his creatinines 2 years ago were less than 1. He was admitted with creatinine 1.47 but this has resolved to 1.2 History of GI bleed in the past.  History of psoriasis.   OUTPATIENT MEDICATIONS: Acetaminophen 650 mg every 4 hours as needed for pain, aspirin 81 mg daily, clobetasol topical cream 2 times a day applied to affected areas, docusate sodium 100 mg two times a day, fluocinolone 0.01% topical solution apply to affected areas as needed, folic acid 1 mg daily, Fosamax 70 mg every Thursday, hydrochlorothiazide 25 mg daily, Hydrocort cream apply 2 times a day to affected areas as needed, hydroxyzine 25 mg once a day at bedtime as needed for itching, methotrexate 2.5 mg. Please take 5 tabs on Saturdays. Prilosec 20 mg two times a day. Penicillin History:  Lives in group home.  Unclear if he has family or who makes medical decisions for this patientresponds that he smokes (or at least has a very significant history of  smoking) Hx: Unable to provide Exam: Temperature (F) : 98.7  Pulse : 73  Respirations : 20 BP : 196  Diastolic BP (mmHg) : 69 BP : 87 mm Hg Pulse Ox % : 94   awake and alertwarmsoft, NT/NDCVA tendernessuncircumcised penis, bilateral testicles palpated with no massescatheter draining light tea colored urine, no clots visibleno edema Data:1.47-->1.217-->13 40-->371.63.4  I personally reviewed the ultrasound of the kidneysUS  - US KIDNEY  - Feb 28 2013  2:52PM  The right kidney measures 11.4 x 5.7 x 5.6 cm. The echotexture renal cortex on the right remains equal to or slightly greater than of the adjacent liver. There is an abnormal solid appearing mass in renal pelvis on the right measuring 4.7 x 4.8 x 4.4 cm. There is no on the right. the left the kidney measures 11.2 x 6.2 x 5.5 cm. There is no evidence hydronephrosis. No mass is demonstrated. The urinary bladder is with a Foley catheter.There is an abnormal mass in the renal pelvis on the right. The is worrisome for malignancy. There is no evidence of obstruction The echotexture the renal cortex on the right is equal to or perhaps greater than that of the adjacent liver.  yo male with gross hematuria and suspicious right central mass on renal ultrasound, differential diagnosis is malignancy, clot, infectious process.  Patient has extensive smoking history.   f/u urine culture.  patient currently on levaquin with improvement in elevated WBCCT urogram- triple phase CT scan with and without contrast with excretory phase  to evaluate the collecting systemoutpatient cystoscopy will be needed to further evaluate the gross hematuria  Yves Dill is on call on Monday and will be notified about patient.  We also need to consider who makes the medical decisions for this patient as we start more extensive work up for possible malignancy.  I will leave this decision to the primary medical team.  Electronic Signatures: Margy Clarks (MD)  (Signed on 18-Oct-14  11:10)  Authored  Last Updated: 18-Oct-14 11:10 by Margy Clarks (MD)

## 2014-09-04 NOTE — Consult Note (Signed)
Brief Consult Note: Diagnosis: Transitional cell carcinoma of R kidney.   Patient was seen by consultant.   Consult note dictated.   Recommend to proceed with surgery or procedure.   Recommend further assessment or treatment.   Orders entered.   Discussed with Attending MD.   Comments: Patient will need Radical nephro-ureterectomy. Suggest referral to a tertiary care center as I no longer perform this procedure.  Electronic Signatures: Royston Cowper (MD)  (Signed 20-Oct-14 09:19)  Authored: Brief Consult Note   Last Updated: 20-Oct-14 09:19 by Royston Cowper (MD)

## 2014-09-04 NOTE — H&P (Signed)
PATIENT NAME:  Matthew Poole, Matthew Poole MR#:  [id number] DATE OF BIRTH:  12-21-1940  REFERRING PHYSICIAN: Garnette Scheuermann, M.D.   PRIMARY CARE PHYSICIAN: Nonlocal.   CHIEF COMPLAINT: Brought in for possible bleeding.   HISTORY OF PRESENT ILLNESS: The patient is a 74 year old Caucasian male who comes in from a group home after staff found blood on the sheets. They suspected a GI bleed. There was  no active bleeding noted by the staff but given that the sheets had some stool and also blood, they brought him in here. Here, he was found to have gross hematuria and he is having some hematuria currently. His white count at 17,000 and also UTI is suspected. He has acute renal failure. He is unable to provide much history but states that he is not in pain. When asked is he  bleeding, he states yes and he points toward his genital area, but it is unclear if he is having bright red blood per rectum or not, but he is having definite hematuria. Hospitalist services were contacted for further evaluation and management.   PAST MEDICAL HISTORY: The patient is unable to provide history but per chart, he has: 1.  History of mental retardation.  2.  Osteoporosis.  3.  Depression.  4.  Anxiety.  5.  GERD.  6.  History of renal insufficiency, but his creatinines 2 years ago were less than 1.  7.  History of GI bleed in the past.  8.  History of psoriasis.   ALLERGIES: PENICILLIN.   OUTPATIENT MEDICATIONS: Acetaminophen 650 mg every 4 hours as needed for pain, aspirin 81 mg daily, clobetasol topical cream 2 times a day applied to affected areas, docusate sodium 100 mg two times a day, fluocinolone 0.01% topical solution apply to affected areas as needed, folic acid 1 mg daily, Fosamax 70 mg every Thursday, hydrochlorothiazide 25 mg daily, Hydrocort cream apply 2 times a day to affected areas as needed, hydroxyzine 25 mg once a day at bedtime as needed for itching, methotrexate 2.5 mg. Please take 5 tabs on Saturdays. Prilosec  20 mg two times a day.   SOCIAL HISTORY: Unable to obtain but he is a group home resident . Per chart, he used to be a smoker. No alcohol.   FAMILY HISTORY: Unavailable.   REVIEW OF SYSTEMS: Unable to obtain secondary to history of his mental status.   PHYSICAL EXAMINATION:  VITAL SIGNS: Temperature on arrival was 98.7. Pulse rate 89, respiratory rate 18, blood pressure 140/74, O2 saturation 96% on room air.  GENERAL: Elderly unkempt male lying in bed, appears to be calm without any distress.  HEENT: Normocephalic, atraumatic. Pupils are equal and reactive. Anicteric sclerae. Somewhat dry mucous membranes. Edentulous.  NECK: Supple. No thyroid tenderness. No cervical lymphadenopathy.  CARDIOVASCULAR: S1, S2. Irregularly irregular. No murmurs, rubs or gallops.  LUNGS: Clear to auscultation without wheezing, rhonchi or rales.  ABDOMEN: Soft, nontender. Hyperactive bowel sounds in all quadrants. He does not appear to have any suprapubic area tenderness.  EXTREMITIES: No significant lower extremity edema.  SKIN: Dry scaly skin and poor turgor with some varicosities in the lower extremities bilaterally and there are some patches of blood on the bed sheets.  NEUROLOGIC: Cranial nerves II through XII appear to be grossly intact. Strength is five out of five in all extremities. He does follow commands. He is awake. He is oriented to the hospital but that is it.   LABORATORY DATA: Glucose 108, BUN 23, creatinine 1.7. Of note,  it was 0.85 in October 2012. Sodium 134, potassium 3.2. LFTs: ALT is 11; otherwise, within normal limits. WBC 17.8, hemoglobin 13.5, platelets are 223. INR is 1. Urinalysis shows red, cloudy, with gross hematuria with 21,207 RBCs and 1286 WBC, trace bacteria.   ASSESSMENT AND PLAN: We have a 75 year old group home resident who has a history of mental retardation, anxiety, psoriasis who comes in for gross hematuria. The staff also thought that he might have bright red blood per  rectum as well but they did not see any. I suspect that the blood that they found on the sheet was likely from the urine as he has gross hematuria here and also was noted by staff to have gross hematuria. He has also acute renal failure which makes me think he possibly has clots and obstructive uropathy as well.   I have discussed the case with the nurse in the Emergency Room to see if we can bladder scan him and put in a Foley. I will obtain a urology consult as well and obtain a renal ultrasound to evaluate for hydronephrosis. I will check hemoglobin q.8 hours. He appears to be hemodynamically stable at this time. I will start him on some fluids as well and hopefully this is obstructive uropathy. After a Foley insertion and some fluids and removing the obstruction, his renal function improves. We will wait for further urological input. Hold nonurgent medications and put him on clears for now in case he does have a possible gastrointestinal source of bleeding as well. I will obtain stool guaiac as well at this point.   He has mild hypokalemia but in the setting of acute renal failure, I would monitor that a little bit  later today. He has a white count of 17,000. It is possible he has a urinary tract infection but he has no fever. I would obtain an x-ray of the chest as well as follow with the urine cultures. He has been given a dose of ceftriaxone. I would put him on Levaquin for now. I would place him on sequential compression devices and thromboembolic disease stockings for deep vein thrombosis prophylaxis. Continue his proton pump inhibitor and folic acid.   TOTAL TIME SPENT: 60 minutes.   ____________________________ Vivien Presto, MD sa:np D: 02/28/2013 13:47:32 ET T: 02/28/2013 14:26:26 ET JOB#: [id number]  cc: Vivien Presto, MD, <Dictator> Vivien Presto MD ELECTRONICALLY SIGNED 04/03/2013 1:48

## 2014-09-04 NOTE — Discharge Summary (Signed)
PATIENT NAME:  Matthew Poole, Matthew Poole MR#:  [id number] DATE OF BIRTH:  1940-08-31  DATE OF ADMISSION:  02/28/2013 DATE OF DISCHARGE:  03/03/2013   FINAL DIAGNOSES:   1.  Acute renal failure, resolved.  2.  Hematuria due to renal mass. Hemoglobin remained stable in hospital. He will continue to lose a little amount of blood in urine until proper treatment of cancer is done, but it is not life threatening bleed at this time.  3. Transitional cell carcinoma and needed surgery with Urology. [facility] does not have the facility for it. Made appointment with [facility], needs to follow, not an urgent situation.  4.  Urinary tract infection.  5.  Hypokalemia.   CONDITION ON DISCHARGE: Stable.   CODE STATUS: Full code.   MEDICATIONS ON DISCHARGE: 1.  Methotrexate 2.5 mg 5 tablets once a week on Saturday.  2.  Prilosec 20 mg delayed-release 2 times a day.  3.  Folic acid 1 mg once a day.  4.  Fosamax 70 mg oral once a week 30 minutes before meal.   5.  Fluocinolone topical 0.01% topical solution to area as needed.    6.  Docusate sodium 100 mg 2 times a day.  7.  Hydrocortisone cream, 0.2% topical cream 2 times a day.  8.  Acetaminophen 325 mg 2 tablets every four hours as needed for pain.  9.  Hydroxyzine hydrochloride 25 mg oral tablet once a day as needed for itching.  10.  Levofloxacin 500 mg oral tablet every 24 hours for two days.   DIET ON DISCHARGE: Low sodium.   DIET CONSISTENCY: Regular.   ACTIVITY: As tolerated.   TIMEFRAME TO FOLLOW-UP: Within 1 to 2 weeks in [place] for cancer in kidney and bladder, needs surgery in future. [facility] does not have facility for it.    HISTORY OF PRESENT ILLNESS:  A 74 year old Caucasian male comes in from group home after staff found blood on the bedsheet, suspected it was GI bleed. There was no active bleeding noted. The patient was found in ER having gross hematuria. His white cell count was 17,000 and also had UTI with acute renal failure   unable to provide much history, so admitted for further evaluation and management.   HOSPITAL COURSE AND STAY:  1.  For the patient's gross hematuria. It was likely secondary to renal mass as it was found on the renal sonogram and CAT scan of the abdomen which was done with contrast.  Urologic consult was called in and possibly it was transitional cell  carcinoma. They suggested, as we do not have the facility to go for a radical nephrectomy with removal of part of the bladder, the patient needs further followup at [facility], but this is not urgent and he can go as outpatient and they can arrange for surgery. The patient had some leaking of the blood from the cancer, but his hemoglobin was stable, so we referred him to [facility] and the clerk made a call and made an appointment for him after discharge.  2.  Acute kidney injury. This was most likely post renal Foley catheter placed in, and his kidney disease and we removed Foley and the patient had a good urine output. Creatinine came back to normal.  3.  Hypokalemia. We replaced it.  4.  Hypomagnesemia. We replaced it.   The plan and the follow-up details were discussed with patient's legal guardian Ronnald Collum, who is his cousin, on the phone multiple  times,  almost daily.   [place]: Dr. Maryan Puls.   LABORATORY, DIAGNOSTIC AND RADIOLOGIC DATA:  On admission, white cell count was 17.8, hemoglobin was 13.5, platelets 223, BUN 23, creatinine 1.7, sodium 134, potassium 3.2. Urinalysis showed 21,000 RBC and 1200 WBC. Urine culture grew more than 100,000 colony-forming units of Streptococcus viridans.   Ultrasound bilateral kidneys showed abnormal mass in renal pelvis on right. Findings were suggestive of malignancy.   Hemoglobin was 12.3, creatinine came down to 1.2 then 1.1.   On further followup, CT scan of the abdomen and pelvis with contrast showed abnormal mass occupying renal pelvis, portion of the mid and lower  pole collecting system on the right was highly suspicious of malignancy, likely transitional cell carcinoma. Mild abnormality of appearance of blood and multiple trabeculated bladder malignancy is not excluded. Occult blood in his stool is negative.   Total time spent in discharge: 40 minutes   ____________________________ Ceasar Lund. Anselm Jungling, MD vgv:cc D: 03/05/2013 15:37:47 ET T: 03/05/2013 19:29:55 ET JOB#: [id number]  cc: Ceasar Lund. Anselm Jungling, MD, <Dictator> Otelia Limes. Yves Dill, MD Vaughan Basta MD ELECTRONICALLY SIGNED 03/12/2013 13:58

## 2014-09-04 NOTE — Consult Note (Signed)
PATIENT NAME:  Matthew Poole, Matthew Poole MR#:  [id number] DATE OF BIRTH:  24-Apr-1941  DATE OF CONSULTATION:  03/03/2013  REFERRING PHYSICIAN:  Dr. Bridgette Habermann CONSULTING PHYSICIAN:  Otelia Limes. Yves Dill, MD  REASON FOR CONSULTATION: Hematuria.  HISTORY OF PRESENT ILLNESS: Mr. Matthew Poole is a 74 year old Caucasian male who was brought to the Emergency Room several days ago with hematuria. The patient is not able to give an accurate urologic history, but states that his urine has been blood-tinged for several days. He denied prior history of kidney stones or urologic disease. The patient had an abdominal CT scan performed on 10/19 which indicated the presence of transitional cell carcinoma involving the right kidney.   PAST MEDICAL HISTORY:  THE PATIENT ALLERGIC TO PENICILLIN.  CHRONIC MEDICATIONS: Tylenol, clobetasol, Colace, fluocinolone, folic acid, Fosamax,  hydrochlorothiazide, hydrocortisone cream, hydroxyzine, methotrexate and Prilosec.   PAST SURGICAL HISTORY: Negative for urologic surgery.   PAST AND CURRENT MEDICAL CONDITIONS:  1. Intellectual disability.  2. Osteoporosis.  3. Depression.  4. Anxiety.  5. GERD. 6. Renal insufficiency.  7. History of gastrointestinal bleeding.  8. Psoriasis.   SOCIAL HISTORY: The patient smokes cigarettes and has a greater than 40 pack-year history. He denied alcohol use.   FAMILY HISTORY: Negative.   REVIEW OF SYSTEMS: the patient denied history of kidney stones, urinary tract infections or prostate disease.   BUN 16. Creatinine was 1.10 on 10/19.   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   SUGGESTIONS: The patient will ultimately need right radical nephroureterectomy. I suggest referral to a tertiary care center as I no longer perform this procedure. The patient is stable for discharge as this procedure can be scheduled on an elective basis after followup with tertiary care center.     Otelia Limes. Yves Dill, MD mrw:sg D: 03/03/2013  09:25:00 ET T: 03/03/2013 10:06:17 ET JOB#: [id number]  cc: Otelia Limes. Yves Dill, MD, <Dictator> Royston Cowper MD ELECTRONICALLY SIGNED 03/03/2013 11:46

## 2014-09-05 NOTE — H&P (Signed)
PATIENT NAME:  Matthew Poole, Matthew Poole MR#:  [id number] DATE OF BIRTH:  1940/07/24  DATE OF ADMISSION:  08/23/2013  REFERRING PHYSICIAN: Wells Guiles L. Lord, MD   PRIMARY CARE PHYSICIAN: None local.  CHIEF COMPLAINT: Hematuria and confusion today.   HISTORY OF PRESENT ILLNESS: A 74 year old Caucasian male with a history of renal carcinoma, mental retardation, GI bleeding, renal insufficiency, was sent from care home due to hematuria and confusion today. The patient was noticed to have confusion and cloudy urine. The patient pulled out chronic Foley catheter and developed hematuria. Foley catheter was placed in ED, but the patient still has hematuria. Urinalysis showed UTI. The patient was treated with meropenem in ED. The patient is confused, unable to provide any information. The information was obtained from Dr. Reita Cliche, ED physician.   PAST MEDICAL HISTORY: From previous records, the patient was diagnosed with renal carcinoma last year. The patient has a history of mental retardation, osteoporosis, depression, anxiety, GERD, renal insufficiency, GI bleeding, and psoriasis.   SOCIAL HISTORY: Unable to obtain at this time, but according to previous documents, no smoking or alcohol drinking.   FAMILY HISTORY: Unable to obtain.   REVIEW OF SYSTEMS: Unable to obtain.   ALLERGIES: PENICILLIN.   PAST SURGICAL HISTORY: PEG tube placement.   PHYSICAL EXAMINATION: VITAL SIGNS: Temperature 97.9, blood pressure 119/58, pulse 87, oxygen saturation 99% on room air.  GENERAL: The patient is weak but confused, unable to provide information. The patient denies any symptoms.  HEENT: Pupils round, equal, and reactive to light and accommodation.  NECK: Supple. No JVD or carotid bruit. No lymphadenopathy. No thyromegaly. Very dry oral mucosa.  CARDIOVASCULAR: S1, S2, regular rate and rhythm. No murmurs or gallops.  PULMONARY: Bilateral air entry. No wheezing or rales. No use of accessory muscles to breathe.  ABDOMEN:  Soft. No distention or tenderness. No organomegaly. Bowel sounds present. PEG tube in situ.  EXTREMITIES: No edema, clubbing, or cyanosis. No calf tenderness. Bilateral pedal pulses present.  NEUROLOGIC: The patient is awake, follows commands. No focal deficits. Power 4 out of 5. Sensation intact.  GENITOURINARY: The patient has bloody urine from Foley catheter.   LABORATORY DATA: Urinalysis shows WBC 10,568, RBC 1315, nitrite negative. CBC showed WBC 11.2, hemoglobin 9.6, platelets 235. Glucose 104, BUN 39, creatinine 1.12. Electrolytes are normal. INR 1.1.   IMPRESSIONS:  1.  Urinary tract infection.  2.  Dehydration.  3.  Altered mental status, possibly due to urinary tract infection and dehydration.  4.  Hematuria. 5.  History of renal carcinoma.  6.  Anemia.   PLAN OF TREATMENT: 1.  The patient will be admitted to medical floor. We will continue meropenem and follow up urine culture, blood culture, CBC.  2.  For dehydration, we will start normal saline and follow up BMP.  3.  We will get a urology consult since the patient has hematuria with a history of renal carcinoma. I discussed with Dr. Deatra Ina, urologist. He suggests CAT scan of abdomen and pelvis with and without contrast.   TIME SPENT: About 56 minutes.    ____________________________ [name], MD qc:jcm D: 08/23/2013 19:37:46 ET T: 08/23/2013 20:55:24 ET JOB#: [id number]  cc: [name], MD, <Dictator> [name] MD ELECTRONICALLY SIGNED 08/24/2013 10:50

## 2014-09-05 NOTE — Consult Note (Signed)
PATIENT NAME:  Matthew Poole, Matthew Poole MR#:  [id number] DATE OF BIRTH:  04/08/1971  DATE OF CONSULTATION:  08/24/2013  REFERRING PHYSICIAN:  Dr. [name]  CONSULTING PHYSICIAN:  Prentiss Bells, MD  CONSULTING SERVICE: Urology.  REASON FOR CONSULTATION:  Hematuria.  HISTORY OF PRESENT ILLNESS: Matthew Poole is a 74 year old man with mental retardation, who was sent from his care home today due to hematuria and confusion. He had apparently pulled out his chronic Foley catheter and subsequently developed hematuria. He was evaluated in the emergency room and a new Foley catheter was placed, and by the time of urology evaluation, his urine was completely clear. 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 He was last seen here in October and since then was referred to a tertiary care center for nephroureterectomy. He is unable to give a detailed urologic history and he is somewhat confused, but not in any outward signs of pain and has no other concerns.   PAST MEDICAL HISTORY:  1. Mental retardation. 2. Osteoporosis.  3. Depression .  4. Anxiety. 5. GERD. 6. Renal insufficiency.  7. History of gastrointestinal bleeding. 8. Psoriasis.  PAST SURGICAL HISTORY: Right nephroureterectomy within the past 6 months, details are unknown.  MEDICATIONS:  1. Clobetasol cream. 2. Diocto 50 mg p.o. t.i.d.  3. Doxazosin 4 mg 1 tab daily. 4. DuoNeb 1 puff q. 4 h. p.r.n.  5. Fluocinolone topical ointment. 6. Multivitamin. 7. Prevacid 30 mg 1 tab daily.  8. Tylenol 2 tab q.4 h p.r.n.. 9. Vitamin C 1 tab p.o. b.i.d.  10. Vitamin D3 1 tab p.o. daily.  ALLERGIES: PENICILLIN.   SOCIAL HISTORY: He has a greater than 40 pack year smoke history but denies alcohol use. Currently lives in a group home.  FAMILY HISTORY: Negative for GU malignancy or stones.  REVIEW OF SYSTEMS: A detailed 13-point review of systems was otherwise negative  except for that which is stated above.   PHYSICAL EXAMINATION:  VITAL SIGNS: Temperature 97.9, blood pressure 119/58, heart rate 97, saturation 99%. IN GENERAL: He is a well-appearing man in no distress. Alert, but confused and unable to provide me historical information. HEENT: Pupils equal reactive to light and accommodation. Oropharynx and nasal cavity clear. Mucous membranes moist.  NECK: No lymphadenopathy. Otherwise supple. CARDIOVASCULAR: Regular rate and rhythm, normal S1, S2. LUNGS: Clear to auscultation bilaterally. No wheezes, rhonchi or rales. ABDOMEN: Soft, nontender, nondistended. He has laparoscopic surgical incisions consistent with right nephroureterectomy. He also has a percutaneous feeding tube. EXTREMITIES: No cyanosis, clubbing or edema. Pulses 2+.  NEUROLOGICAL: Shows normal sensation and motor. GENITOURINARY: He has a chronic Foley catheter draining clear yellow urine.   LABORATORIES: On 04/12/20105 glucose 91, BUN 30, creatinine   0.9, sodium 144, potassium 3.7, chloride 113, CO2 26, calcium 8.1, white blood cell count 9.3, H and H 9.0 and 28.6, platelet 220. Urinalysis  shows turbid urine, specific gravity 1.018, pH 5.0, 100 protein, 2+ leukocyte esterase, negative nitrite, 1,315 red cells, 10, 568 white cells, and 3+ bacteria.   CT, 08/23/2013 shows absent right kidney suggestive of right nephroureterectomy. The left kidney appears normal, the bladder is thick-walled  though it is collapsed around the Foley catheter. He has prostatic enlargement. No upper abdominal adenopathy or pelvic or inguinal adenopathy seen.  ASSESSMENT: Matthew Poole is a 74 year old man with intellectual disability, chronic indwelling Foley catheter and history of right upper tract transitional cell carcinoma. The hematuria  seen initially in the emergency room was likely due to a traumatic Foley catheter removal and has subsequently completely resolved.   RECOMMENDATIONS:  1. For his hematuria  he should continue with his chronic indwelling Foley catheter. Likely with the catheter in place, the urethral or prostatic injury will heal spontaneously. If he can maintain the Foley catheter chronically as he normally would, but it should not be removed for any reason within the next week to allow the injured urethra to heal. 2. He also has apparent urinary tract infection. He should be started on antibiotics and these can be directed by urine culture when available.  3. For his transitional cell carcinoma he will need surveillance and followup. This will be arranged on outpatient basis either with the urologist at [facility] or at the tertiary center where he received his nephroureterectomy. We do not know any details of the surgery or when the surgery occurred, but he nonetheless should undergo routine bladder and upper tract surveillance if it is in fact transitional cell carcinoma. If it is renal cell carcinoma, the surveillance would be different and this needs to be clarified.  4. At this point, there is no surgical intervention required as his hematuria is resolved. Urology will sign off, but please do not hesitate to contact us if any further questions arise.    ____________________________ Prentiss Bells, MD agk:sg D: 08/24/2013 11:32:00 ET T: 08/24/2013 12:51:27 ET JOB#: [id number]  cc: Prentiss Bells, MD, <Dictator> Laqueisha Catalina Bertram Savin MD ELECTRONICALLY SIGNED 10/14/2013 16:51

## 2014-09-05 NOTE — Consult Note (Signed)
Urology consult dictated [id number] Hematuria from traumatic Foley catheter removal, resolvedUrinary Tract InfectionHistory of right upper tract transitional cell carcinoma, s/p right nephroureterectomy within the past 6 months No need for any further intervention for hematuria. Continue Foley catheter for at least 1 week to allow his urethra to heal from traumatic removal (though his catheter is chronic).Treat UTI, awaiting culture and sensitivityHe presumably had right upper tract transitional cell carcinoma based on CT 02/2013, and underwent right nephroureterectomy at another institution.  He will need outpatient follow-up for cancer surveillence. you for this consult. See dictation for further details.  Electronic Signatures: Prentiss Bells (MD)  (Signed on 12-Apr-15 11:36)  Authored  Last Updated: 12-Apr-15 11:36 by Prentiss Bells (MD)

## 2014-09-05 NOTE — H&P (Signed)
PATIENT NAME:  Matthew Poole, Matthew Poole MR#:  [id number] DATE OF BIRTH:  April 21, 1941  PRIMARY CARE PHYSICIAN: [name], MD  CHIEF COMPLAINT: Urinary tract infection.   HISTORY OF PRESENT ILLNESS: A 74 year old male sent from the nursing home for foul-smelling urinary tract infection and confusion. The patient has a chronic Foley catheter and his catheter was changed in the ER, and his urine was very foul-smelling as per the ER physician. He continues to have some confusion.   REVIEW OF SYSTEMS: Unable to obtain as the patient is confused.   PAST MEDICAL HISTORY: From medical records: 1.  Renal cell carcinoma.  2.  Mental retardation.  3.  Osteoporosis.  4.  Depression. 5.  Anxiety. 6.  Gastroesophageal reflux disease. 7.  Renal insufficiency.  8.  Psoriasis.   SOCIAL HISTORY: According to past records, no alcohol, tobacco, or IV drug use.   FAMILY HISTORY: Unable to obtain.   ALLERGIES: ACCORDING TO MEDICAL CHART, PENICILLIN.   PAST SURGICAL HISTORY: None per medical chart.   MEDICATIONS:  1.  Acetaminophen 325 q. 4 hours p.r.n.  2.  Centrum Silver 15 mL daily.  3.  Docusate 5 mL b.i.d. p.r.n.  4.  Doxazosin 2 mg daily.  5.  Omeprazole 20 mg b.i.d.  6.  Peridex 0.1 to mucous membranes 15 mL b.i.d.  7.  Triamcinolone to affected area b.i.d.  8.  Vitamin C 250 two tablets b.i.d.  9.  Vitamin D3, 1000 International Units daily.   PHYSICAL EXAMINATION: VITAL SIGNS: Temperature 100.3, pulse 117, respirations 20, blood pressure 180/102, 96% on room air.  GENERAL: The patient is alert and oriented to name, but not place or time.  HEENT: Head is atraumatic. Pupils are round, reactive. Sclerae anicteric. Mucous membranes are dry. Oropharynx is clear. NECK: Supple. No JVD, carotid bruit, or (enlarged thryoid CARDIOVASCULAR: Regular rate and rhythm. No murmurs, gallops, or rubs.  PMI is not displaced.   LUNGS:  Clear to auscultation without crackle, rales, rhonchi or wheezing. Normal  to percussion.  ABDOMEN:  Bowel sounds are positive. Nontender. No hepatosplenomegaly.  EXTREMITIES:  No clubbing, cyanosis or edema.   SKIN: The patient has evidence of psoriasis.  NEUROLOGIC:  Cranial nerves 2 through 12 are intact. No focal deficit.  LABORATORY DATA: White blood cells 14.2, hemoglobin 15, hematocrit 47.5, platelets are 188,000, total protein 8.3, albumin 3.8, bilirubin 0.7, alkaline phosphatase 139, AST 17, ALT is 20. Sodium 138, potassium 3.4, chloride 105, bicarbonate 24, BUN 39, creatinine 1.55. Glucose is 119.   UA with >200 WBC + LCE +bactermia  ASSESSMENT AND PLAN: A 74 year old male who presented from the nursing home with sepsis.  1.  Sepsis,  secondary to urinary tract infection. The patient had Escherichia coli urinary tract infection sensitive to cephalosporins back in April. We will continue with Rocephin. He has allergy to penicillin, apparently is itching.  Will monitor. Urine cultures have been ordered as well as blood cultures.  2.  He meets sepsis criteria by fever, tachycardia, and leukocytosis.  3.  History of renal cell carcinoma. The patient can have outpatient followup. 4.  History of BPH and doxazosin, which we will continue.  5.  History of psoriasis. We can continue his topical creams.  6.  Acute kidney injury. Creatinine in April was 0.9, today it is 1.55.  We will provide some intravenous fluids. Repeat a BMP in the a.m.  7.  The patient appears to be a full code status.    TIME SPENT: Approximately 40  minutes.   ____________________________ Donell Beers. Benjie Karvonen, MD spm:LT D: 02/09/2014 16:24:33 ET T: 02/09/2014 17:18:19 ET JOB#: [id number]  cc: Valdemar Mcclenahan P. Benjie Karvonen, MD, <Dictator> [name], MD Donell Beers Erikka Follmer MD ELECTRONICALLY SIGNED 02/09/2014 19:49

## 2014-09-05 NOTE — Discharge Summary (Signed)
PATIENT NAME:  Matthew Poole, Matthew Poole MR#:  [id number] DATE OF BIRTH:  [birth year]  DATE OF ADMISSION:  02/09/2014 DATE OF DISCHARGE:  02/11/2014  DISCHARGE DIAGNOSES:    1.   Catheter associated Escherichia coli urinary tract infection with sepsis.  2.  History of renal cell carcinoma.  3.  History of benign prostatic hypertrophy.  4.  History of psoriasis.  5.  Acute renal failure, resolved with hydration.   CHIEF COMPLAINT: Confusion secondary to urinary tract infection.   HISTORY OF PRESENT ILLNESS:  Matthew Poole is a 74 year old male who was sent from a group home with foul-smelling urine associated with a urinary tract infection and mental confusion. The patient has a chronic Foley catheter and his Foley was changed in the ED, and his urine was noted to be foul smelling as per ER physician.   PAST MEDICAL HISTORY: Significant for renal cell carcinoma, mental retardation, osteoporosis, depression, anxiety, GERD, and renal insufficiency. Please see H and P for other details.   LABORATORY DATA:  Initial labs showed a WBC count of 14.2, hemoglobin of 15, hematocrit of 47.5, platelets 188,000.  Total protein is 8.3.  Sodium 138, potassium 3.4, bicarbonate 24, BUN 39, creatinine 1.55. UA showed more than 200 WBCs. Urine cultures grew Escherichia coli, which was sensitive to Rocephin and also nitrofurantoin. It was resistant to Levaquin as well as Cipro.   HOSPITAL COURSE: The patient received IV Rocephin and, overall, mental status improved. He was back to baseline in terms of his confusion. His renal function also improved. His serum creatinine was 1.05 on discharge. He remained afebrile and was discharged back to his group home on the following medications. The patient also received G-tube feeds while he was in the hospital.   DISCHARGE MEDICATIONS:  Doxazosin 2 mg once a day, Centrum multiple vitamins with minerals 15 mL once a day, omeprazole 20 mg 1 capsule twice a day, vitamin C 2 tablets  twice a day, Docusate 5 mL b.i.d., triamcinolone topical cream apply to affected area twice a day,  vitamin D3 one tab once a day,  Ceftin 250 mg b.i.d. for 1 more week, and Peridex 0.12% mucous membrane liquid 15 mL to mucous membranes twice a day.   DISCHARGE INSTRUCTIONS:  The patient was advised to continue G-tube feeds and to have his Foley changed on a monthly basis and to follow with me, Dr. Ginette Pitman, in 1 to 2 weeks' time.   CONDITION ON DISCHARGE: The patient is stable at the time of discharge.   TOTAL TIME SPENT ON DISCHARGING THIS PATIENT: Thirty-five minutes.   ____________________________ [name], MD vh:lr D: 02/11/2014 12:54:16 ET T: 02/11/2014 17:40:00 ET JOB#: [id number]  cc: [name], MD, <Dictator> [name] MD ELECTRONICALLY SIGNED 02/19/2014 13:13

## 2014-09-05 NOTE — Discharge Summary (Signed)
PATIENT NAME:  Matthew Poole, Matthew Poole MR#:  [id number] DATE OF BIRTH:  02-May-1941  DATE OF ADMISSION:  08/23/2013 DATE OF DISCHARGE:  08/25/2013  For a detailed note, please take a look at the history and physical done on admission by Dr. Bridgett Larsson.   DIAGNOSES AT DISCHARGE: As follows:  Hematuria, likely due to Foley trauma; urinary tract infection. Mental retardation. Dysphagia, status post chronic percutaneous endoscopic gastrostomy tube, benign prostatic hypertrophy. Gastroesophageal reflux disease.   DISCHARGE DIET:  The patient is being discharged on a pureed diet with honey-thick liquids, but this is only to be used for pleasure. The patient is to be followed up with speech therapy with strict aspiration precautions when he gets these pleasure feeds. The patient likely to be discharged to get most of his nutrition through tube feedings.  Add Isosource 1.5 at 250 mL bolus t.i.d. with 350 mL free water flushes along with Isosource 1.5 at 95 mL/h x 10 hours to run nocturnal via pump with 25 mL of free water along with the tube feeds.   THE PATIENT'S DISCHARGE MEDICATIONS ARE AS FOLLOWS: Fluocinolone topical ointment 0.01% to be applied daily, DuoNebs 4 times daily as needed, clobetasol topical ointment to be applied b.i.d., Peridex 0.1 to mucous membranes be applied b.i.d., Diocto 10 mg 50 mg per mL 15 mg enteral t.i.d., doxazosin 4 mg via G-tube daily, multivitamin daily, Prevacid 30 mg via G-tube daily, Tylenol 650 q.4 hours as needed, vitamin C 500 mg b.i.d., vitamin D3 1000 International Units daily and Ceftin 500 mg b.i.d. x 5 days.   [place] COURSE: Dr. Chales Salmon from urology.   PERTINENT LABORATORY, DIAGNOSTIC AND RADIOLOGICAL DATA DONE DURING THE HOSPITAL COURSE: A urine culture to be growing E. coli. Blood cultures negative. A CT scan of the abdomen and pelvis done without contrast showing no acute findings, thick-walled urinary bladder, status post right nephrectomy,  atherosclerosis.   BRIEF HOSPITAL COURSE: This is a 74 year old male with medical problems as mentioned above, presented to the hospital due to altered mental status and noted to have hematuria.  1.  Hematuria. The most likely cause of the patient's hematuria was secondary to Foley trauma. The patient had pulled on his Foley. The patient was seen by urology. They recommended keeping the Foley in for another week until the hematuria resolved. The patient's hemoglobin has remained stable, did not require any further acute intervention or any blood transfusions.  2.  Urinary tract infection. This was secondary to E. coli. The patient was treated with meropenem while in the hospital. It is resistant to Cipro. Therefore, I am discharging the patient or oral Ceftin as it is sensitive to cefazolin and  ceftriaxone.  3.  History of renal cell carcinoma,  status post right nephrectomy. The patient needs followup with his urologist as an outpatient. No acute issue related to this.  4.  Dysphagia. The patient is status post PEG tube placement and is on tube feedings. There was some talk about the patient actually taking some oral intake. The patient was seen by speech therapy and had a modified barium done and, as per them, the patient can take nectar-thick liquids with pureed diet but this should only be for pleasure. The patient's primary nutritional needs should come through the tube feedings as he is still a high aspiration risk presently. Speech further needs to follow this patient at the group home. Once the patient's G-tube intake has stabilized and he has gained some weight, we can  have speech re-evaluate and possibly increase his oral intake at a later point.  5.  History of BPH.  The patient is being discharged with a Foley, currently on doxazosin. We will continue that.  6.  GERD.   The patient was maintained on some Protonix while in the hospital. He will resume his Prevacid upon discharge.  7.  CODE  STATUS: The patient is a full code.  8.  Disposition: He is being discharged to his group home.   TIME SPENT: 40 minutes.   ____________________________ Belia Heman. Verdell Carmine, MD vjs:cs D: 08/25/2013 16:30:42 ET T: 08/25/2013 20:49:21 ET JOB#: [id number]  cc: Belia Heman. Verdell Carmine, MD, <Dictator> Henreitta Leber MD ELECTRONICALLY SIGNED 09/08/2013 10:38

## 2014-09-13 NOTE — Consult Note (Signed)
Chief Complaint:  Subjective/Chief Complaint patient awake and alert but denies any pain states to be resting comfortably   VITAL SIGNS/ANCILLARY NOTES: **Vital Signs.:   02-Feb-16 13:40  Vital Signs Type Routine  Temperature Temperature (F) 98.6  Celsius 37  Temperature Source oral  Pulse Pulse 81  Respirations Respirations 20  Systolic BP Systolic BP 256  Diastolic BP (mmHg) Diastolic BP (mmHg) 65  Mean BP 89  Pulse Ox % Pulse Ox % 91  Pulse Ox Activity Level  At rest  Oxygen Delivery Room Air/ 21 %  *Intake and Output.:   02-Feb-16 13:00  Grand Totals Intake:  118 Output:      Net:  118 24 Hr.:  -387  Oral Intake      In:  118   Brief Assessment:  GEN well developed, well nourished, no acute distress   Cardiac Regular  murmur present   Respiratory normal resp effort  clear BS   Gastrointestinal Normal   Gastrointestinal details normal Soft   EXTR negative cyanosis/clubbing   Lab Results: Routine Chem:  01-Feb-16 03:46   Glucose, Serum 98  BUN  44  Creatinine (comp)  1.51  Sodium, Serum  146  Potassium, Serum 4.3  Chloride, Serum  115  CO2, Serum 23  Calcium (Total), Serum  8.2  Anion Gap 8  Osmolality (calc) 302  eGFR (African American)  59  eGFR (Non-African American)  49 (eGFR values <88m/min/1.73 m2 may be an indication of chronic kidney disease (CKD). Calculated eGFR, using the MRDR Study equation, is useful in  patients with stable renal function. The eGFR calculation will not be reliable in acutely ill patients when serum creatinine is changing rapidly. It is not useful in patients on dialysis. The eGFR calculation may not be applicable to patients at the low and high extremes of body sizes, pregnant women, and vegetarians.)  02-Feb-16 03:53   Glucose, Serum 87  BUN  36  Creatinine (comp)  1.42  Sodium, Serum 145  Potassium, Serum 4.0  Chloride, Serum  112  CO2, Serum 22  Calcium (Total), Serum  8.2  Anion Gap 11  Osmolality (calc)  296  eGFR (African American) >60  eGFR (Non-African American)  52 (eGFR values <638mmin/1.73 m2 may be an indication of chronic kidney disease (CKD). Calculated eGFR, using the MRDR Study equation, is useful in  patients with stable renal function. The eGFR calculation will not be reliable in acutely ill patients when serum creatinine is changing rapidly. It is not useful in patients on dialysis. The eGFR calculation may not be applicable to patients at the low and high extremes of body sizes, pregnant women, and vegetarians.)  Cardiac:  01-Feb-16 03:46   CPK-MB, Serum  3.7 (Result(s) reported on 15 Jun 2014 at 04:17AM.)  Routine Hem:  01-Feb-16 03:46   WBC (CBC)  26.7  RBC (CBC)  3.31  Hemoglobin (CBC)  11.0  Hematocrit (CBC)  33.3  Platelet Count (CBC)  146  MCV  101  MCH 33.1  MCHC 32.9  RDW 13.9  Neutrophil % 88.5  Lymphocyte % 4.7  Monocyte % 6.4  Eosinophil % 0.2  Basophil % 0.2  Neutrophil #  23.6  Lymphocyte # 1.3  Monocyte #  1.7  Eosinophil # 0.1  Basophil # 0.1 (Result(s) reported on 15 Jun 2014 at 04:23AM.)  02-Feb-16 03:53   WBC (CBC)  18.0  RBC (CBC)  3.30  Hemoglobin (CBC)  11.1  Hematocrit (CBC)  33.4  Platelet Count (CBC)  134  MCV  101  MCH 33.6  MCHC 33.2  RDW 13.8  Neutrophil % 85.8  Lymphocyte % 6.8  Monocyte % 5.5  Eosinophil % 1.6  Basophil % 0.3  Neutrophil #  15.4  Lymphocyte # 1.2  Monocyte # 1.0  Eosinophil # 0.3  Basophil # 0.0 (Result(s) reported on 16 Jun 2014 at 04:34AM.)   Radiology Results: Cardiology:    31-Jan-16 08:34, ECG  Ventricular Rate 140  Atrial Rate 140  P-R Interval 148  QRS Duration 100  QT 266  QTc 406  R Axis 17  T Axis -4  ECG interpretation   Sinus tachycardia  Cannot rule out Anterior infarct , age undetermined  Abnormal ECG  When compared with ECG of 09-Feb-2014 14:58,  Minimal criteria for Anterior infarct are now Present  Nonspecific T wave abnormality now evident in Anterolateral  leads  ----------unconfirmed----------  Confirmed by OVERREAD, NOT ([id number]), editor PEARSON, BARBARA ([id number]) on 06/15/2014 2:13:56 PM  ECG   CT:    31-Jan-16 13:03, CT Abdomen Pelvis WO for Stone  CT Abdomen Pelvis WO for Stone   REASON FOR EXAM:    hematuria, foley catheter obstruction from bleeding  COMMENTS:       PROCEDURE: CT  - CT ABDOMEN /PELVIS WO (STONE)  - Jun 14 2014  1:03PM     CLINICAL DATA:  74 year old male with history of bloody urine in an  indwelling catheter.    EXAM:  CT ABDOMEN AND PELVIS WITHOUT CONTRAST    TECHNIQUE:  Multidetector CT imaging of the abdomen and pelvis was performed  following the standard protocol without IV contrast.  COMPARISON:  CT of the abdomen and pelvis 08/23/2013.    FINDINGS:  Lower chest: Mild scarring in the lung bases bilaterally. Large  hiatal hernia. Atherosclerotic calcifications in the distal right  coronary artery.    Hepatobiliary: No discrete cystic or solid hepatic lesions  identified on today's non contrast CT examination. Tiny calcified  granuloma in segment 2 of the liver. Gallbladder is normal in  appearance.    Pancreas: Unremarkable.    Spleen: Unremarkable.  Adrenals/Urinary Tract: Bilateral adrenal glands are normal in  appearance. Status post right nephrectomy. No abnormal calcification  within the collecting system of the left kidney, along the course of  the left ureter, or within the lumen of the urinary bladder. Left  kidney is normal in appearance on today's non contrast CT  examination.No hydroureteronephrosis. Urinary bladder is completely  decompressed around an indwelling Foley catheter. 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 In addition, there are several tiny locules of gas in the  surrounding perivesical fat, presumably within tiny venous branches.  Larger left pelvic vein also demonstrates a relatively large amount  of intravenous gas  (image 77 of series 2) in the left inguinal  region, with gas extending into the left common femoral vein (image  76 of series 2).  Stomach/Bowel: Percutaneous gastrostomy tube in position appears  properly located. The intra-abdominal portion of the stomach is  otherwise normal in appearance. No pathologic dilatation of small  bowel or colon. Suture line immediately above the anus suggesting  prior distal rectal resection.    Vascular/Lymphatic: Gas in the pelvic veins, as discussed above.  Extensive atherosclerosis throughout the abdominal and pelvic  vasculature, withoutdefinite aneurysm. No lymphadenopathy noted in  the abdomen or pelvis on today's non contrast CT examination.    Reproductive: There is  a large amount of gas adjacent to the Foley  catheter within the base of the penis. This region is incompletely  visualized, however, this gas is clearly extra urethral, and appears  to be infiltrating the corpus spongiosum. Prostate gland and seminal  vesicles are unremarkable in appearance.    Other: No significant volume of ascites.  No pneumoperitoneum.    Musculoskeletal: Small amount of gas in the adductor space of the  upper right thigh, presumably additional venous gas. There are no  aggressive appearing lytic or blastic lesions noted in the  visualized portions of the skeleton.     IMPRESSION:  1. 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  Notably, there is also a large amount of gas in the visualized  portions of the base of the penis, adjacent to the ureter apparently  within the substance of the corpus spongiosum. Emergent urologic  consultation is strongly recommended. There is also some associated  venous gas throughout the pelvis and upper right thigh, as above.  2. Status post right nephrectomy.  3. No urinary tract calculi to explain the patient's hematuria.  4. Large hiatal hernia.  5. Extensive atherosclerosis.  6. Additional  incidental findings, as above.  Critical Value/emergent results were called by telephone at the time  of interpretation on 06/14/2014 at 1:51 pm to Dr. Delman Kitten, who  verbally acknowledged these results.      Electronically Signed    By: Vinnie Langton M.D.    On: 06/14/2014 14:01     Verified By: Etheleen Mayhew, M.D.,   Assessment/Plan:  Invasive Device Daily Assessment of Necessity:  Does the patient currently have any of the following indwelling devices? foley   Indwelling Urinary Catheter continued, requirement due to   Reason to continue Indwelling Urinary Catheter bladder irrigation is required   Assessment/Plan:  Assessment elevated troponin  mental retardation  hematuria  renal cell cancer  anemia of chronic disease  GERD  psoriasis  UTI therapy .   Plan continue  Foley  continue Foley a radiation for hematuria  continue antibiotic therapy for e. coli infection  probably demand ischemia continue current therapy  continue methotrexate for psoriasis therapy  continue reflux therapy possibly with omeprazole  agree with urology input  DVT prophylaxis   Electronic Signatures: Lujean Amel D (MD)  (Signed 02-Feb-16 17:56)  Authored: Chief Complaint, VITAL SIGNS/ANCILLARY NOTES, Brief Assessment, Lab Results, Radiology Results, Assessment/Plan   Last Updated: 02-Feb-16 17:56 by Yolonda Kida (MD)

## 2014-09-13 NOTE — Consult Note (Signed)
Admit Reason:   Hematuria (R31.9): Status: Active, Coding System: ICD10, Coded Name: Hematuria, unspecified    Dysphagia  (has PEGG tube):    Rt Renal Carcinoma:    upper GI bleed:    Psoriasis:    Osteoporosis:    Mentally Handicapped:    J.C.Johnson POA [phone]:   Home Medications: Medication Instructions Status  sulfamethoxazole-trimethoprim 400 mg-80 mg oral tablet 1 tab(s) orally 2 times a day for 7 days. Active  Cipro 500 mg tablet 1 tab(s) orally every 12 hours x 10 days Active  doxazosin 2 mg oral tablet 1 tab(s) orally once a day Active  Centrum Multiple Vitamins with Minerals oral liquid 1 tablespoonsful (15 milliliters) orally once a day. Active  omeprazole 20 mg oral delayed release capsule 1 cap(s) orally 2 times a day Active  Vitamin C 250 mg oral tablet 2 tabs (558m) orally 2 times a day Active  acetaminophen 325 mg oral tablet 1 tab(s) orally every 4 hours, As Needed - for Pain Active  Peridex 0.12% mucous membrane liquid 15 milliliter(s) mucous membrane 2 times a day (after breakfast and at bedtime). Active  triamcinolone topical 0.1% topical cream Apply topically to psoriasis 2 times a day Active  Vitamin D3 1000 intl units oral tablet 1 tab(s) orally once a day Active  Head & Shoulders Apply topically to affected area once a day, As Needed for dry scalp Active  docusate 10 mg/mL oral liquid 1 teaspoonsful (5 milliliters) orally 2 times a day as needed for constipation. Active  folic acid 1 mg oral tablet 1 tab(s) orally once a day except on Saturday.  Active  methotrexate 2.5 mg oral tablet 4 tabs (164m orally once a week (on Saturday).  Active   Lab Results: Hepatic:  31-Jan-16 08:37   Bilirubin, Total 0.6  Alkaline Phosphatase 116  SGPT (ALT) 15  SGOT (AST) 16  Total Protein, Serum 7.5  Albumin, Serum  3.2  Routine Chem:  31-Jan-16 08:37   Result Comment UA DIPSTICK - Unable to obtain valid dipstick results  - due to interference of gross  blood in the  - specimen.  Result(s) reported on 14 Jun 2014 at 09:50AM.  Result Comment TROPONIN - RESULTS VERIFIED BY REPEAT TESTING.  - CALLED TO DENIA ROYSTER @ [id number] 1/31  - 2016 CAF  - READ-BACK PROCESS PERFORMED.  Result(s) reported on 14 Jun 2014 at 10:03AM.  Lipase 229 (Result(s) reported on 14 Jun 2014 at 09:29AM.)  Glucose, Serum  110  BUN  39  Creatinine (comp)  1.57  Sodium, Serum 141  Potassium, Serum 3.9  Chloride, Serum  108  CO2, Serum 24  Calcium (Total), Serum 9.5  Osmolality (calc) 291  eGFR (African American)  56  eGFR (Non-African American)  46 (eGFR values <6040min/1.73 m2 may be an indication of chronic kidney disease (CKD). Calculated eGFR, using the MRDR Study equation, is useful in  patients with stable renal function. The eGFR calculation will not be reliable in acutely ill patients when serum creatinine is changing rapidly. It is not useful in patients on dialysis. The eGFR calculation may not be applicable to patients at the low and high extremes of body sizes, pregnant women, and vegetarians.)  Anion Gap 9  Cardiac:  31-Jan-16 08:37   Troponin I  0.11 (0.00-0.05 0.05 ng/mL or less: NEGATIVE  Repeat testing in 3-6 hrs  if clinically indicated. >0.05 ng/mL: POTENTIAL  MYOCARDIAL INJURY. Repeat  testing in 3-6 hrs if  clinically indicated. NOTE: An  increase or decrease  of 30% or more on serial  testing suggests a  clinically important change)  Routine UA:  31-Jan-16 08:37   Color (UA) RED  Clarity (UA) BLOODY  Glucose (UA) see comment  Bilirubin (UA) see comment  Ketones (UA) see comment  Specific Gravity (UA) 1.020  Blood (UA) see comment  pH (UA) see comment  Protein (UA) see comment  Nitrite (UA) SEE COMMENT  Leukocyte Esterase (UA) see comment  RBC (UA) 83340 /HPF  WBC (UA) 284 /HPF  Bacteria (UA) NONE SEEN  Epithelial Cells (UA) 14 /HPF (Result(s) reported on 14 Jun 2014 at 09:50AM.)  Routine Hem:  31-Jan-16 08:37    WBC (CBC) 6.7  RBC (CBC)  4.10  Hemoglobin (CBC) 14.0  Hematocrit (CBC) 41.8  Platelet Count (CBC) 170 (Result(s) reported on 14 Jun 2014 at 09:15AM.)  MCV  102  MCH  34.2  MCHC 33.6  RDW 13.7    Penicillin: Itching   General Aspect Matthew Poole is a 74 year old man with mental retardation now with a recent history of recurrent gross hematuria.  He has a history of renal cell carcinoma s/p right nephrectomy.  He has had a chronic indwelling Foley catheter while living in a group home.  He has been sent to the ED multiple times for catheter changes, irrigation of multiple obstructing clots and continuous bladder irrigation.  He was here for the same thing today.  A 49F 3-way catheter is in place on slow CBI with mostly clear urine.  UCx from last admission shows E.Coli resistant to Cipro which he has been on.   PAST MEDICAL HISTORY: 1. Renal cell carcinoma.  2. History of anemia of chronic disease.  3. Gastroesophageal reflex disease.  4. Mentally retardation.  PAST SURGICAL HISTORY: 1. Right nephrectomy Others unknown   Case History and Physical Exam:  Family History Non-Contributory   HEENT PERLA  Appears older than stated age, mental retardation but in no distress   Neck/Nodes Supple  No Adenopathy   Chest/Lungs Clear   Cardiovascular No Murmurs or Gallops  Normal Sinus Rhythm   Abdomen Benign  non-tender, non-distended   Genitalia WNL  Normal phallus, 49F 3-way catheter in place draining light pink on CBI. No penile crepitus. Normal testes and scrotum; no perineal crepitus or discoloration   Rectal Not examined   Musculoskeletal Full range of motion   Neurological Not examined   Skin Warm  Dry    Impression 74 year old man with a chronic indwelling catheter and recurrent gross hematuria.  He has an E. Coli UTI resistent to Cipro.  His CT shows gas within the bladder and some in the bladder wall.  Gas also tracks through the corpus spongiosum surrounding the  penile and bulbar urethra.    The gas may be related to infection or repeated Foley catheter placement, removal and irrigation.  He does not appear septic, nor does he have any signs or symptoms of Fourniers gangrene at this time   Plan 1) Continue his continuous bladder irrigation titrated to keep urine light pink 2) Broad spectrum IV antibiotics 3) Pain control 4) My concern is that the gas around his urethra is a sign of significant urethral inflammation and injury and that if left like this, with the catheter in place, may progress to Fourniers gangrene or worsening infection.  Once his hematuria is resolved and he has had several days of IV antibiotics, we may  convert his indwelling urethral catheter to a suprapubic catheter.  I will discuss this with the other Urologists covering during the week.  Thank you for involving me in the care of Matthew Poole. Please call Urology with any questions.   Electronic Signatures: Prentiss Bells (MD)  (Signed 31-Jan-16 15:54)  Authored: Health Issues, Significant Events - History, Home Medications, Labs, Allergies, General Aspect/Present Illness, History and Physical Exam, Impression/Plan   Last Updated: 31-Jan-16 15:54 by Prentiss Bells (MD)

## 2014-09-13 NOTE — Discharge Summary (Signed)
PATIENT NAME:  Matthew Poole, Matthew Poole MR#:  [id number] DATE OF BIRTH:  1941-03-14  DATE OF ADMISSION:  06/14/2014 DATE OF DISCHARGE:  06/17/2014  DIAGNOSES AT TIME OF DISCHARGE:  1. Hematuria with blocked Foley catheter and Escherichia coli urinary tract infection.  2. Elevated troponin.  3. History of mental retardation.   4. History of renal cell carcinoma.  5. Gastroesophageal reflux disease.  6. Psoriasis.   CHIEF COMPLAINT: Blocked Foley with hematuria and bloody urine.   HISTORY OF PRESENT ILLNESS: Matthew Poole is a 74 year old male who was sent from group home after he was noted to have a blocked Foley catheter and also hematuria. The patient has a history of mental retardation and history of renal CA status post nephrectomy, also has a history of gastroesophageal reflux disease. The patient denied any fevers or chills. He was also noted to have an elevated troponin. The patient himself denied any chest pain. He is unable to provide much information.   PAST MEDICAL HISTORY: Significant for renal cell carcinoma, history of anemia of chronic disease, gastroesophageal reflux disease, and mental retardation, he has had previous right nephrectomy.   PHYSICAL EXAMINATION:  VITAL SIGNS: Temperature was 98.2, pulse was 144, respirations 30, blood pressure 117/72, pulse oximetry 98% on room air.  GENERAL:  He did not appear to be in distress.  HEENT: NCAT.  NECK: Supple.  HEART: S1, S2.  LUNGS: Clear to auscultation.  ABDOMEN: Soft, nontender.  EXTREMITIES: No edema.   LABORATORIES: Urinalysis showed bloody urine. WBC count 6.7, hemoglobin 14 hematocrit 41.8, platelets 170,000. Sodium 141, potassium 3.9, chloride 108, bicarbonate 24, BUN 29.  He had recent urine cultures done 06/10/2014 that showed Escherichia coli that was resistant to fluoroquinolones. EKG showed evidence of sinus tachycardia. Troponin initially was 0.11 and went up to 1.3 and subsequently 1.5. His renal function did  improve, creatinine moving from 1.57 to 1.24. He was seen in consultation by cardiologist, and also Dr. Elnoria Howard and Dr. Deatra Ina, urologist. It was felt that he would benefit from suprapubic catheterization. 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   During his stay in the hospital the patient received intravenous Rocephin and he symptomatically improved, hematuria also cleared. He also received bladder irrigation. It was not clear why he had elevated troponin, although he was seen by cardiologist, Dr. Clayborn Bigness felt that no further functional study was deemed necessary at this point based on his overall condition, it was felt that this could be due to demand ischemia although an anterior infarct could not be ruled out. He was also seen by dietitian for G-tube feeding and he was discharged in stable condition on the following medications.   DISCHARGE MEDICATIONS:  Doxazosin 2 mg once a day, Centrum multiple vitamins and minerals oral liquid 1 tablespoon once a day, omeprazole 20 mg twice a day, vitamin C two tablets twice a day, acetaminophen 325 one tablet q. 4 hours as needed, Peridex 1.2% mucous membrane liquid 15 mL b.i.d. after breakfast and bedtime, triamcinolone topical cream to be applied topically to psoriasis 2 times a day, vitamin D3 one tablet once a day, Docusate 1 teaspoon orally b.i.d. as needed for constipation, folic acid 1 mg  once a day except on Saturday, Jevity 1.5 Cal bolus feeds, and nitrofurantoin 100 mg p.o. b.i.d. for 10 days.   The patient has been advised to follow up with Dr. Elnoria Howard for possible consideration of a suprapubic catheter. He was stable at  the time of discharge. Home health nursing was also arranged and the patient was discharged back to his group home in stable condition and advised G-tube feeds with puree and nectar thick liquids as pleasure p.o. along with his tube feedings via PEG and also advised to monitor the amounts of food in each order to prevent from overfilling and risking regurgitation/aspiration. Strict aspiration precautions were advised and medications were advised to be pureed or with nectar liquids if not by PEG.  The patient was stable at the time of discharge.  He will follow up with Dr. Elnoria Howard as well as follow up with me, Dr. Ginette Pitman, in 2-3 weeks time.   TOTAL TIME SPENT IN DISCHARGING THE PATIENT: 35 minutes.     ____________________________ [name], MD vh:bu D: 06/18/2014 13:02:55 ET T: 06/18/2014 16:26:52 ET JOB#: [id number]  cc: [name], MD, <Dictator> [name] MD ELECTRONICALLY SIGNED 07/08/2014 13:20

## 2014-09-13 NOTE — H&P (Signed)
PATIENT NAME:  Matthew Poole, Matthew Poole MR#:  [id number] DATE OF BIRTH:  May 15, 1941  DATE OF ADMISSION:  06/14/2014  ADMITTING PHYSICIAN: Gladstone Lighter, MD.   PRIMARY CARE PHYSICIAN: [name], MD.   CHIEF COMPLAINT: Bloody urine.   HISTORY OF PRESENT ILLNESS: Matthew Poole is a 74 year old male send from group home secondary to blocked Foley catheter. The patient does have mental retardation and is unable to provide any history at this time. Most of the history is obtained from previous records and also from the ER physician's notes. The patient has a history of renal cell carcinoma, has history of chronic Foley catheter, anemia, reflux disease, mental retardation, noticed to have bloody urine and also blocked Foley catheter. No fevers or chills noted. The patient's catheter was changed here in the emergency room. Initially, urine drained was clear and then he started to have hematuria again. He was also noted to have slightly elevated troponin. The patient denies any chest pain and is unable to provide any further information.   PAST MEDICAL HISTORY: 1. Renal cell carcinoma.  2. History of anemia of chronic disease.  3. Gastroesophageal reflex disease.  4. Mentally retardation.   PAST SURGICAL HISTORY: Unknown. He does have some scars on his abdomen.   ALLERGIES TO MEDICATIONS: PENICILLIN.   CURRENT HOME MEDICATIONS: Include:  1. Tylenol 325 mg every 4 hours p.r.n. for pain.  2. Centrum multivitamin liquid 50 mL daily.  3. Colace 10 mg/mL, 1 tsp 5 mL orally twice a day as needed for constipation.  4. Doxazosin 2 mg p.o. daily.  5. Folic acid 1 mg p.o. daily except on Saturdays.  6. Head and shoulders topically to scalp as needed.  7. Methotrexate 10 mg every week on Saturdays.  8. Omeprazole 20 mg p.o. b.i.d.  9. Peridex oral liquid 15 mL twice a day.  10. Bactrim double strength 1 tablet p.o. b.i.d. for 7 days, started 2 days ago.  11. Triamcinolone 0.1% topical cream to  psoriasis twice a day.  12. Vitamin C 500 mg p.o. b.i.d.  13. Vitamin D3 - 1000 international daily.   SOCIAL HISTORY: From group home. No smoking or alcohol use history.   FAMILY HISTORY: Unknown.  REVIEW OF SYSTEMS: Difficult to be obtained secondary to the patient's mental retardation.   PHYSICAL EXAMINATION: VITAL SIGNS: Temperature 98.2 degrees Fahrenheit, pulse 144, respirations 30, blood pressure 117/72, pulse 98% on room air.   GENERAL: Well-built, well-nourished male, lying in bed. Currently does not appear to be in any distress.   HEENT: Normocephalic, atraumatic. Right eye conjunctiva is slightly erythematous. No tearing noted. Pupils are equal, round, reacting to light. Anicteric sclera. Extraocular movements are intact. Oropharynx clear without erythema, mass or exudates.   NECK: Supple. No thyromegaly, JVD or carotid bruits. No lymphadenopathy.   LUNGS: Moving air bilaterally. Decreased basilar breath sounds. No crackles. No use of accessory muscles for breathing.   CARDIOVASCULAR: S1, S2, regular rate and rhythm, 3/6 systolic murmur heard.   ABDOMEN: Soft, nontender, nondistended. No hepatosplenomegaly PEG tube in place. Normal bowel sounds.   EXTREMITIES: No pedal edema. No clubbing or cyanosis, 2+ dorsalis pedis pulses palpable bilaterally.   SKIN: No acne, rash or lesions.   LYMPHATICS: No cervical lymphadenopathy.   NEUROLOGIC: The patient following commands, able to move all extremities, 4/5 strength. Unable to do complete neurologic exam secondary to the patient's mental condition.   PSYCHOLOGIC: Seems alert, not oriented.   LABORATORY DATA: Urinalysis showing bloody urine. WBC 6.7, hemoglobin 14.3,  hematocrit 41.8, platelet count 170,000. Sodium 141, potassium 3.9, chloride 108, bicarbonate 24, BUN 39, creatinine 1.57, glucose 110 and calcium of 9.5. ALT 15, AST 16, alkaline phosphatase 116, total bilirubin 0.6, albumin of 3.2, troponin 0.11. Recent urine  cultures from 06/10/2014 showing Escherichia coli resistant to fluoroquinolones.   DIAGNOSTIC DATA: EKG showing sinus tachycardia.   ASSESSMENT AND PLAN: A 74 year old male with mental retardation, renal cell carcinoma from group home admitted for hematuria, block chronic Foley catheter.  1. Hematuria with blocked chronic Foley catheter, changed in the emergency room. Bloody urine persists. Admit for bladder irrigation, urology consult, hemoglobin check every 8 hours and intravenous fluids.  2. Mild renal insufficiency, likely from acute tubular necrosis, dehydration, hypertension. Intravenous fluids and monitor.  3. Recent Escherichia coli urinary tract infection, started on Bactrim for 7 days total, started just 2 ago since Escherichia coli was resistant to Cipro. With methotrexate and Bactrim, hold myelosuppression, think we will change to Keflex based on sensitivities for 5 more days.  4. Elevated troponin, could be demand ischemia. Sinus tachycardic and hypotensive here. Recycle and monitor. Anyways with bleeding, no aspirin or heparin. The patient is not complaining of any chest pain.  we will hold off on cardiology consult. Monitor on off unit telemetry.  5. Psoriasis on methotrexate and folic acid. Continue.  6. Deep vein thrombosis prophylaxis test and sequential compression devices. Hold heparin because of hematuria.   CODE STATUS: Full code.   TIME SPENT ON ADMISSION: 50 minutes.    ____________________________ Gladstone Lighter, MD rk:TT D: 06/14/2014 13:05:39 ET T: 06/14/2014 13:18:35 ET JOB#: [id number]  cc: Gladstone Lighter, MD, <Dictator> [name], MD Gladstone Lighter MD ELECTRONICALLY SIGNED 06/22/2014 17:00

## 2014-09-13 NOTE — Consult Note (Signed)
PATIENT NAME:  Matthew Poole, BUSS MR#:  [id number] DATE OF BIRTH:  December 23, 1940  DATE OF CONSULTATION:  06/15/2014  REFERRING PHYSICIAN:  Gladstone Lighter, MD CONSULTING PHYSICIAN:  Dwayne D. Clayborn Bigness, MD  PRIMARY CARE PHYSICIAN: [name], MD  INDICATION FOR CONSULTATION: Elevated troponin.  HISTORY OF PRESENT ILLNESS: Matthew Poole is a 74 year old white male with history of mental retardation. He lives in a chronic care facility. Reportedly presented with blocked Foley with blood. Not able to give much of the history. He has history of renal cell cancer with chronic Foley. He has history of anemia, reflux, mental retardation. With the worsening hematuria and blocked Foley, he was sent in for evaluation. No fever was noted. As part of his work-up, laboratories were suggestive of an elevated troponin. Cardiology consultation was recommended There is no clear evidence if he has had any chest pain or shortness of breath symptoms.   PAST MEDICAL HISTORY: Renal cancer, anemia, reflux, mental retardation, chronic Foley, hematuria.   PAST SURGICAL HISTORY: Possible surgery for renal cancer. The patient is not able to give a history.   ALLERGIES: PENICILLIN.  HOME MEDICATIONS: Tylenol as needed for pain, multivitamin, Colace p.r.n., doxazosin 2 mg daily, folic acid daily except on Saturdays, methotrexate 10 mg every week, omeprazole 20 mg twice a day, triamcinolone cream 0.1% for psoriasis twice a day, vitamin C twice a day, vitamin D3 once a day.   SOCIAL HISTORY: Lives in a group home. No smoking or alcohol consumption.   FAMILY HISTORY: Unknown.   REVIEW OF SYSTEMS: Unable to obtain. The patient is mentally retarded, not able to give much in the way of history.   PHYSICAL EXAMINATION: VITAL SIGNS: Blood pressure was 120/70, pulse 100, and respiratory rate 18. Afebrile.  HEENT: Normocephalic, atraumatic. Pupils equal and reactive to light.  NECK: Supple. No significant JVD, bruits or  adenopathy.  LUNGS: Clear to auscultation and percussion. No significant wheezing, rhonchi, or rales.  HEART: Slightly tachycardic. Systolic ejection murmur at the apex.  ABDOMEN: Benign.  EXTREMITIES: Within normal limits. NEUROLOGIC: Difficult to assess because of mental retardation, not able to follow much in the way of commands, but appears to be within normal limits.   DIAGNOSTIC DATA: Hematuria grossly in the urine for urinalysis. White count is 6.7, hemoglobin 14.3, hematocrit 41.8, platelet count 170,000. Sodium 141, potassium 3.9, chloride 108, bicarb 24, BUN 39, creatinine 1.57, glucose 110. LFTs negative. Troponin 0.11.   EKG: Sinus tachycardia on admission, 115, nonspecific finding.   ASSESSMENT:  1.  Elevated troponin. 2.  Hematuria. 3.  Renal insufficiency. 4.  History of Escherichia coli urinary tract infection in the past. 5.  Renal cancer. 6.  Mental retardation. 7.  Sinus tachycardia. 8.  Psoriasis.   PLAN: Agree with admit. Followup troponins. Unclear of the significance. Consider echocardiogram. Follow up 2 to 3 more troponins to see if they remain flat. Do not recommend any functional study. Do not recommend cardiac catheterization because of the patient's overall condition, uncertainty of symptoms. Deep vein thrombosis prophylaxis, but would limit that dose because of his hematuria. Continue urology evaluation and therapy for hematuria. Continue reflux therapy. Continue possible pain control. Continue antibiotic therapy for possible urinary tract infection. At this point, would maintain a relatively conservative cardiology input with his overall medical condition and potential lack of symptoms. We will continue to follow for now. I would recommend conservative cardiology input at this stage.   ____________________________ Loran Senters Clayborn Bigness, MD ddc:sb D: 06/16/2014 07:51:00 ET T: 06/16/2014  09:12:09 ET JOB#: [id number]  cc: Dwayne D. Clayborn Bigness, MD, <Dictator> Yolonda Kida MD ELECTRONICALLY SIGNED 06/23/2014 17:27

## 2014-09-13 NOTE — Consult Note (Signed)
Chief Complaint:  Subjective/Chief Complaint Continues on light CBI, urine clear. No fevers overnight. Feels well.   VITAL SIGNS/ANCILLARY NOTES: **Vital Signs.:   01-Feb-16 05:48  Vital Signs Type Recheck  Temperature Temperature (F) 98.5  Celsius 36.9  Temperature Source oral  Pulse Pulse 82  Respirations Respirations 20  Systolic BP Systolic BP 161  Diastolic BP (mmHg) Diastolic BP (mmHg) 57  Mean BP 75  Pulse Ox % Pulse Ox % 93  Pulse Ox Activity Level  At rest  Oxygen Delivery Room Air/ 21 %  *Intake and Output.:   01-Feb-16 07:00  Oral Intake      In:  0    Daily 07:00  Grand Totals Intake:  3106 Output:  2625    Net:  481 24 Hr.:  481  IV (Primary)      In:  1346  Other Intake cc     In:  60  Other Intake cc     In:  400  Urine ml     Out:  800  Continuous Bladder Irrigation ml     In:  1300    Out:  1825    Net:  -525  Length of Stay Totals Intake:  3106 Output:  2625    Net:  481   Brief Assessment:  GEN well developed, well nourished, no acute distress   Cardiac Regular  no murmur  -- LE edema  --Rub  --Gallop   Respiratory normal resp effort  clear BS   Gastrointestinal Normal   Gastrointestinal details normal Soft  Nontender  Nondistended   EXTR negative cyanosis/clubbing, negative edema   Additional Physical Exam 64F 3-way Foley catheter in place. Penis appears normal but tender. No lesions. Scrotum and perineum normal with no evidence of Fourniers gangrene.   Lab Results:  Routine Chem:  01-Feb-16 03:46   Glucose, Serum 98  BUN  44  Creatinine (comp)  1.51  Sodium, Serum  146  Potassium, Serum 4.3  Chloride, Serum  115  CO2, Serum 23  Calcium (Total), Serum  8.2  Anion Gap 8  Osmolality (calc) 302  eGFR (African American)  59  eGFR (Non-African American)  49 (eGFR values <45m/min/1.73 m2 may be an indication of chronic kidney disease (CKD). Calculated eGFR, using the MRDR Study equation, is useful in  patients with stable renal  function. The eGFR calculation will not be reliable in acutely ill patients when serum creatinine is changing rapidly. It is not useful in patients on dialysis. The eGFR calculation may not be applicable to patients at the low and high extremes of body sizes, pregnant women, and vegetarians.)  Cardiac:  01-Feb-16 03:46   CPK-MB, Serum  3.7 (Result(s) reported on 15 Jun 2014 at 04:17AM.)  Routine Hem:  01-Feb-16 03:46   WBC (CBC)  26.7  RBC (CBC)  3.31  Hemoglobin (CBC)  11.0  Hematocrit (CBC)  33.3  Platelet Count (CBC)  146  MCV  101  MCH 33.1  MCHC 32.9  RDW 13.9  Neutrophil % 88.5  Lymphocyte % 4.7  Monocyte % 6.4  Eosinophil % 0.2  Basophil % 0.2  Neutrophil #  23.6  Lymphocyte # 1.3  Monocyte #  1.7  Eosinophil # 0.1  Basophil # 0.1 (Result(s) reported on 15 Jun 2014 at 04:23AM.)   Assessment/Plan:  Assessment/Plan:  Assessment 74year old man with gross hematuria, 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 Leukocytosis noted.   Plan 1) Continue IV antibiotics 2)  Wean CBI to off 3) He may ultimately need a suprapubic catheter to divert away from the penile urethra and get the urethral catheter out.  Will discuss with the on-call Urologists.  Thank you for involving me in his care.  Please call Urology with any further questions.   Electronic Signatures: Prentiss Bells (MD)  (Signed 01-Feb-16 07:23)  Authored: Chief Complaint, VITAL SIGNS/ANCILLARY NOTES, Brief Assessment, Lab Results, Assessment/Plan   Last Updated: 01-Feb-16 07:23 by Prentiss Bells (MD)

## 2014-12-03 ENCOUNTER — Encounter: Payer: Self-pay | Admitting: Podiatry

## 2014-12-03 ENCOUNTER — Ambulatory Visit (INDEPENDENT_AMBULATORY_CARE_PROVIDER_SITE_OTHER): Payer: Commercial Managed Care - HMO | Admitting: Podiatry

## 2014-12-03 VITALS — BP 122/67 | HR 85 | Resp 17

## 2014-12-03 DIAGNOSIS — B351 Tinea unguium: Secondary | ICD-10-CM | POA: Diagnosis not present

## 2014-12-03 DIAGNOSIS — M79676 Pain in unspecified toe(s): Secondary | ICD-10-CM | POA: Diagnosis not present

## 2014-12-03 NOTE — Progress Notes (Signed)
Patient ID: Matthew Poole, male   DOB: [birth year], 74 y.o.   MRN: [id number]  Subjective: 74 y.o.-year-old male returns the office today for painful, elongated, thickened toenails which he is unable to trim himself. Denies any redness or drainage around the nails. Denies any acute changes since last appointment and no new complaints today. Denies any systemic complaints such as fevers, chills, nausea, vomiting.   Objective: AAO 3, NAD DP/PT pulses palpable, CRT less than 3 seconds Protective sensation intact with Simms Weinstein monofilament, Achilles tendon reflex intact.  Nails hypertrophic, dystrophic, elongated, brittle, discolored 10. There is tenderness overlying the nails 1-5 bilaterally. There is no surrounding erythema or drainage along the nail sites. No open lesions or pre-ulcerative lesions are identified bilaterally No other areas of apparent tenderness bilateral lower extremities. No overlying edema, erythema, increased warmth. No pain with calf compression, swelling, warmth, erythema.  Assessment: Patient presents with symptomatic onychomycosis  Plan: -Treatment options including alternatives, risks, complications were discussed -Nails sharply debrided 10 without complication/bleeding. -Discussed daily foot inspection. If there are any changes, to call the office immediately.  -Follow-up in 3 months or sooner if any problems are to arise. In the meantime, encouraged to call the office with any questions, concerns, changes symptoms.  Celesta Gentile, DPM

## 2014-12-25 ENCOUNTER — Inpatient Hospital Stay
Admission: EM | Admit: 2014-12-25 | Discharge: 2014-12-29 | DRG: 698 | Disposition: A | Discharge status: Home / Self Care | Payer: Commercial Managed Care - HMO | Attending: Internal Medicine | Admitting: Internal Medicine

## 2014-12-25 ENCOUNTER — Encounter: Payer: Self-pay | Admitting: Internal Medicine

## 2014-12-25 ENCOUNTER — Emergency Department: Payer: Commercial Managed Care - HMO

## 2014-12-25 DIAGNOSIS — Z79899 Other long term (current) drug therapy: Secondary | ICD-10-CM

## 2014-12-25 DIAGNOSIS — N390 Urinary tract infection, site not specified: Secondary | ICD-10-CM | POA: Diagnosis present

## 2014-12-25 DIAGNOSIS — Z880 Allergy status to penicillin: Secondary | ICD-10-CM | POA: Diagnosis not present

## 2014-12-25 DIAGNOSIS — E860 Dehydration: Secondary | ICD-10-CM | POA: Diagnosis present

## 2014-12-25 DIAGNOSIS — A419 Sepsis, unspecified organism: Secondary | ICD-10-CM | POA: Diagnosis present

## 2014-12-25 DIAGNOSIS — N179 Acute kidney failure, unspecified: Secondary | ICD-10-CM | POA: Diagnosis present

## 2014-12-25 DIAGNOSIS — Z931 Gastrostomy status: Secondary | ICD-10-CM | POA: Diagnosis not present

## 2014-12-25 DIAGNOSIS — Z1623 Resistance to quinolones and fluoroquinolones: Secondary | ICD-10-CM | POA: Diagnosis present

## 2014-12-25 DIAGNOSIS — N189 Chronic kidney disease, unspecified: Secondary | ICD-10-CM | POA: Diagnosis present

## 2014-12-25 DIAGNOSIS — L409 Psoriasis, unspecified: Secondary | ICD-10-CM | POA: Diagnosis present

## 2014-12-25 DIAGNOSIS — F79 Unspecified intellectual disabilities: Secondary | ICD-10-CM | POA: Diagnosis present

## 2014-12-25 DIAGNOSIS — Z85528 Personal history of other malignant neoplasm of kidney: Secondary | ICD-10-CM

## 2014-12-25 DIAGNOSIS — T83511A Infection and inflammatory reaction due to indwelling urethral catheter, initial encounter: Secondary | ICD-10-CM

## 2014-12-25 DIAGNOSIS — K219 Gastro-esophageal reflux disease without esophagitis: Secondary | ICD-10-CM | POA: Diagnosis present

## 2014-12-25 DIAGNOSIS — R1310 Dysphagia, unspecified: Secondary | ICD-10-CM | POA: Diagnosis present

## 2014-12-25 DIAGNOSIS — Z87891 Personal history of nicotine dependence: Secondary | ICD-10-CM

## 2014-12-25 DIAGNOSIS — Z87440 Personal history of urinary (tract) infections: Secondary | ICD-10-CM

## 2014-12-25 DIAGNOSIS — T8351XA Infection and inflammatory reaction due to indwelling urinary catheter, initial encounter: Secondary | ICD-10-CM | POA: Diagnosis present

## 2014-12-25 DIAGNOSIS — B9620 Unspecified Escherichia coli [E. coli] as the cause of diseases classified elsewhere: Secondary | ICD-10-CM | POA: Diagnosis present

## 2014-12-25 HISTORY — DX: Malignant (primary) neoplasm, unspecified: C80.1

## 2014-12-25 HISTORY — DX: Unspecified intellectual disabilities: F79

## 2014-12-25 HISTORY — DX: Psoriasis, unspecified: L40.9

## 2014-12-25 LAB — URINALYSIS COMPLETE WITH MICROSCOPIC (ARMC ONLY)
Bacteria, UA: NONE SEEN
Bilirubin Urine: NEGATIVE
Glucose, UA: NEGATIVE mg/dL
KETONES UR: NEGATIVE mg/dL
Nitrite: NEGATIVE
PH: 6 (ref 5.0–8.0)
Protein, ur: 100 mg/dL — AB
SPECIFIC GRAVITY, URINE: 1.012 (ref 1.005–1.030)
SQUAMOUS EPITHELIAL / LPF: NONE SEEN

## 2014-12-25 LAB — CBC WITH DIFFERENTIAL/PLATELET
BASOS ABS: 0.3 10*3/uL — AB (ref 0–0.1)
Basophils Relative: 2 %
Eosinophils Absolute: 0 10*3/uL (ref 0–0.7)
Eosinophils Relative: 0 %
HEMATOCRIT: 40.1 % (ref 40.0–52.0)
Hemoglobin: 13.2 g/dL (ref 13.0–18.0)
LYMPHS PCT: 4 %
Lymphs Abs: 0.7 10*3/uL — ABNORMAL LOW (ref 1.0–3.6)
MCH: 32.5 pg (ref 26.0–34.0)
MCHC: 33 g/dL (ref 32.0–36.0)
MCV: 98.5 fL (ref 80.0–100.0)
Monocytes Absolute: 0.8 10*3/uL (ref 0.2–1.0)
Monocytes Relative: 5 %
NEUTROS PCT: 89 %
Neutro Abs: 14.7 10*3/uL — ABNORMAL HIGH (ref 1.4–6.5)
PLATELETS: 192 10*3/uL (ref 150–440)
RBC: 4.07 MIL/uL — ABNORMAL LOW (ref 4.40–5.90)
RDW: 14 % (ref 11.5–14.5)
WBC: 16.5 10*3/uL — AB (ref 3.8–10.6)

## 2014-12-25 LAB — LIPASE, BLOOD: LIPASE: 26 U/L (ref 22–51)

## 2014-12-25 LAB — COMPREHENSIVE METABOLIC PANEL
ALK PHOS: 103 U/L (ref 38–126)
ALT: 8 U/L — ABNORMAL LOW (ref 17–63)
ANION GAP: 13 (ref 5–15)
AST: 35 U/L (ref 15–41)
Albumin: 3.5 g/dL (ref 3.5–5.0)
BUN: 35 mg/dL — ABNORMAL HIGH (ref 6–20)
CHLORIDE: 107 mmol/L (ref 101–111)
CO2: 20 mmol/L — AB (ref 22–32)
Calcium: 9.1 mg/dL (ref 8.9–10.3)
Creatinine, Ser: 2.42 mg/dL — ABNORMAL HIGH (ref 0.61–1.24)
GFR calc non Af Amer: 25 mL/min — ABNORMAL LOW (ref >60)
GFR, EST AFRICAN AMERICAN: 29 mL/min — AB (ref >60)
Glucose, Bld: 177 mg/dL — ABNORMAL HIGH (ref 65–99)
Potassium: 4.1 mmol/L (ref 3.5–5.1)
Sodium: 140 mmol/L (ref 135–145)
Total Bilirubin: 0.7 mg/dL (ref 0.3–1.2)
Total Protein: 6.9 g/dL (ref 6.5–8.1)

## 2014-12-25 LAB — LACTIC ACID, PLASMA
LACTIC ACID, VENOUS: 1.9 mmol/L (ref 0.5–2.0)
Lactic Acid, Venous: 3.8 mmol/L (ref 0.5–2.0)

## 2014-12-25 LAB — TROPONIN I: TROPONIN I: 0.06 ng/mL — AB (ref <0.031)

## 2014-12-25 MED ORDER — SODIUM CHLORIDE 0.9 % IV BOLUS (SEPSIS)
500.0000 mL | INTRAVENOUS | Status: AC
  Administered 2014-12-25: 500 mL via INTRAVENOUS

## 2014-12-25 MED ORDER — HEPARIN SODIUM (PORCINE) 5000 UNIT/ML IJ SOLN
5000.0000 [IU] | Freq: Three times a day (TID) | INTRAMUSCULAR | Status: DC
  Administered 2014-12-25 – 2014-12-29 (×12): 5000 [IU] via SUBCUTANEOUS
  Filled 2014-12-25 (×12): qty 1

## 2014-12-25 MED ORDER — LEVOFLOXACIN IN D5W 750 MG/150ML IV SOLN
750.0000 mg | INTRAVENOUS | Status: DC
  Filled 2014-12-25: qty 150

## 2014-12-25 MED ORDER — ADULT MULTIVITAMIN LIQUID CH
5.0000 mL | Freq: Every day | ORAL | Status: DC
  Administered 2014-12-26 – 2014-12-29 (×4): 5 mL via ORAL
  Filled 2014-12-25 (×4): qty 5

## 2014-12-25 MED ORDER — SODIUM CHLORIDE 0.9 % IV SOLN
INTRAVENOUS | Status: AC
  Administered 2014-12-25 – 2014-12-26 (×2): via INTRAVENOUS

## 2014-12-25 MED ORDER — LEVOFLOXACIN IN D5W 750 MG/150ML IV SOLN
750.0000 mg | Freq: Once | INTRAVENOUS | Status: AC
  Administered 2014-12-25: 750 mg via INTRAVENOUS
  Filled 2014-12-25: qty 150

## 2014-12-25 MED ORDER — CLOTRIMAZOLE 1 % EX CREA
1.0000 "application " | TOPICAL_CREAM | Freq: Two times a day (BID) | CUTANEOUS | Status: DC
  Administered 2014-12-25 – 2014-12-29 (×8): 1 via TOPICAL
  Filled 2014-12-25: qty 15

## 2014-12-25 MED ORDER — ACETAMINOPHEN 325 MG PO TABS: 325.0000 mg | ORAL_TABLET | ORAL | Status: DC | PRN

## 2014-12-25 MED ORDER — DEXTROSE 5 % IV SOLN
1.0000 g | Freq: Once | INTRAVENOUS | Status: AC
  Administered 2014-12-25: 1 g via INTRAVENOUS
  Filled 2014-12-25: qty 1

## 2014-12-25 MED ORDER — VITAMIN C 500 MG PO TABS
500.0000 mg | ORAL_TABLET | Freq: Every day | ORAL | Status: DC
  Administered 2014-12-26 – 2014-12-29 (×4): 500 mg via ORAL
  Filled 2014-12-25 (×4): qty 1

## 2014-12-25 MED ORDER — JEVITY 1.5 CAL/FIBER PO LIQD
237.0000 mL | Freq: Three times a day (TID) | ORAL | Status: DC
  Administered 2014-12-27: 237 mL

## 2014-12-25 MED ORDER — DOXAZOSIN MESYLATE 2 MG PO TABS
2.0000 mg | ORAL_TABLET | Freq: Every day | ORAL | Status: DC
  Administered 2014-12-26 – 2014-12-29 (×4): 2 mg via ORAL
  Filled 2014-12-25 (×6): qty 1

## 2014-12-25 MED ORDER — TRIAMCINOLONE ACETONIDE 0.1 % EX CREA
1.0000 "application " | TOPICAL_CREAM | Freq: Two times a day (BID) | CUTANEOUS | Status: DC
  Administered 2014-12-25 – 2014-12-29 (×8): 1 via TOPICAL
  Filled 2014-12-25: qty 15

## 2014-12-25 MED ORDER — SODIUM CHLORIDE 0.9 % IV BOLUS (SEPSIS)
1000.0000 mL | INTRAVENOUS | Status: AC
  Administered 2014-12-25 (×2): 1000 mL via INTRAVENOUS

## 2014-12-25 MED ORDER — DOCUSATE SODIUM 100 MG PO CAPS: 100.0000 mg | ORAL_CAPSULE | Freq: Two times a day (BID) | ORAL | Status: DC | PRN

## 2014-12-25 MED ORDER — FOLIC ACID 1 MG PO TABS
5.0000 mg | ORAL_TABLET | ORAL | Status: DC
  Administered 2014-12-27 – 2014-12-29 (×3): 5 mg via ORAL
  Filled 2014-12-25 (×3): qty 5

## 2014-12-25 MED ORDER — METHOTREXATE 2.5 MG PO TABS: 7.5000 mg | ORAL_TABLET | ORAL | Status: DC

## 2014-12-25 MED ORDER — CETYLPYRIDINIUM CHLORIDE 0.05 % MT LIQD
7.0000 mL | Freq: Two times a day (BID) | OROMUCOSAL | Status: DC
  Administered 2014-12-25 – 2014-12-29 (×6): 7 mL via OROMUCOSAL

## 2014-12-25 NOTE — ED Notes (Signed)
Ems from group home for fever and tachypnea. Pt is poor historian. Has a peg tube and foley cath.

## 2014-12-25 NOTE — H&P (Signed)
Matthew Poole at [place] NAME: Matthew Poole    MR#:  [id number]  DATE OF BIRTH:  December 01, 1940  DATE OF ADMISSION:  12/25/2014  PRIMARY CARE PHYSICIAN: [name], MD   REQUESTING/REFERRING PHYSICIAN: [place]  CHIEF COMPLAINT:   Chief Complaint  Patient presents with  . Fever    HISTORY OF PRESENT ILLNESS: Matthew Poole  is a 74 y.o. male with a known history of mental retardation, renal cell carcinoma and chronic indwelling catheter, psoriasis, dysphagia and PEG tube placement- sent from performed today for fever and tachycardia. Because of patient's mental issue relates not possible to get any history from him, so mainly history obtained from nursing staff and emergency room. His Foley catheter was blocked and the nurse in ER change the catheter and send the urine for sample which came out to be positive for too many WBCs. He also had elevated white cell count and some rise in his creatinine level.  PAST MEDICAL HISTORY:   Past Medical History  Diagnosis Date  . Difficulty walking   . Muscle weakness   . Dysphagia, oropharyngeal   . Reflux   . Cancer     renal cell carcinoma  . Mental retardation   . Psoriasis     PAST SURGICAL HISTORY: PEG tube placement SOCIAL HISTORY:  Social History  Substance Use Topics  . Smoking status: Former Research scientist (life sciences)  . Smokeless tobacco: Not on file  . Alcohol Use: No    FAMILY HISTORY:  Family History  Problem Relation Age of Onset  . Family history unknown: Yes    Because of patient's mental status I'm not able to get any family history from him.  DRUG ALLERGIES:  Allergies  Allergen Reactions  . Penicillins Itching and Nausea And Vomiting  . Penicillin V Rash    unknown    REVIEW OF SYSTEMS:   Because of patient's mental status he is not able to give me any review of system.  MEDICATIONS AT HOME:  Prior to Admission medications   Medication Sig Start Date End Date Taking?  Authorizing Provider  acetaminophen (TYLENOL) 325 MG tablet Take 325 mg by mouth every 4 (four) hours as needed for mild pain, moderate pain, fever or headache.   Yes Historical Provider, MD  chlorhexidine (PERIDEX) 0.12 % solution Use as directed 0.5 mLs in the mouth or throat 2 (two) times daily.    Yes Historical Provider, MD  cholecalciferol (VITAMIN D) 1000 UNITS tablet Take 1,000 Units by mouth daily.   Yes Historical Provider, MD  clotrimazole (LOTRIMIN) 1 % cream Apply 1 application topically 2 (two) times daily.   Yes Historical Provider, MD  docusate (COLACE) 50 MG/5ML liquid Take 50 mg by mouth 2 (two) times daily as needed for mild constipation.   Yes Historical Provider, MD  doxazosin (CARDURA) 2 MG tablet Take 2 mg by mouth daily.   Yes Historical Provider, MD  folic acid (FOLVITE) 1 MG tablet Take 5 mg by mouth See admin instructions. Takes everyday BUT Saturday   Yes Historical Provider, MD  methotrexate (RHEUMATREX) 2.5 MG tablet Take 7.5 mg by mouth once a week. Patient takes on Saturday. Caution:Chemotherapy. Protect from light.   Yes Historical Provider, MD  Multiple Vitamins-Minerals (MULTIVITAMIN WITH IRON-MINERALS) liquid Take 15 mLs by mouth daily.    Yes Historical Provider, MD  nitrofurantoin, macrocrystal-monohydrate, (MACROBID) 100 MG capsule Take 100 mg by mouth daily.    Yes Historical Provider, MD  omeprazole (PRILOSEC) 20  MG capsule Take 20 mg by mouth 2 (two) times daily before a meal.    Yes Historical Provider, MD  Probiotic Product (4X PROBIOTIC PO) Take 1 capsule by mouth at bedtime.   Yes Historical Provider, MD  triamcinolone cream (KENALOG) 0.1 % Apply 1 application topically 2 (two) times daily.   Yes Historical Provider, MD  vitamin C (ASCORBIC ACID) 250 MG tablet Take 500 mg by mouth daily.   Yes Historical Provider, MD      PHYSICAL EXAMINATION:   VITAL SIGNS: Blood pressure 120/64, pulse 85, temperature 99 F (37.2 C), temperature source Oral, resp.  rate 25, height 5\' 10"  (1.778 m), weight 81.647 kg (180 lb), SpO2 98 %.  GENERAL:  74 y.o.-year-old patient lying in the bed with no acute distress. He is very dishaveled. EYES: Pupils equal, round, reactive to light. No scleral icterus. Extraocular muscles intact.  HEENT: Head atraumatic, normocephalic. Oropharynx and nasopharynx clear. Psoriatic skin changes on hair bearing areas of face. NECK:  Supple, no jugular venous distention. No thyroid enlargement, no tenderness.  LUNGS: Normal breath sounds bilaterally, no wheezing, rales,rhonchi or crepitation. No use of accessory muscles of respiration.  CARDIOVASCULAR: S1, S2 normal. No murmurs, rubs, or gallops.  ABDOMEN: Soft, nontender, nondistended. Bowel sounds present. No organomegaly or mass. PEG in place, some skin changes around PEG. Foley catheter, Yeast infection around genital in groin. EXTREMITIES: No pedal edema, cyanosis, or clubbing.  NEUROLOGIC: Cranial nerves II through XII are intact. Muscle strength 4/5 in all extremities. Gait not checked. Not following commands. PSYCHIATRIC: The patient is alert Not oriented. SKIN: as above, psoriatic lesions on face, yeast lesions on groin.   LABORATORY PANEL:   CBC  Recent Labs Lab 12/25/14 0901  WBC 16.5*  HGB 13.2  HCT 40.1  PLT 192  MCV 98.5  MCH 32.5  MCHC 33.0  RDW 14.0  LYMPHSABS 0.7*  MONOABS 0.8  EOSABS 0.0  BASOSABS 0.3*   ------------------------------------------------------------------------------------------------------------------  Chemistries   Recent Labs Lab 12/25/14 0901  NA 140  K 4.1  CL 107  CO2 20*  GLUCOSE 177*  BUN 35*  CREATININE 2.42*  CALCIUM 9.1  AST 35  ALT 8*  ALKPHOS 103  BILITOT 0.7   ------------------------------------------------------------------------------------------------------------------ estimated creatinine clearance is 28.5 mL/min (by C-G formula based on Cr of  2.42). ------------------------------------------------------------------------------------------------------------------ No results for input(s): TSH, T4TOTAL, T3FREE, THYROIDAB in the last 72 hours.  Invalid input(s): FREET3   Coagulation profile No results for input(s): INR, PROTIME in the last 168 hours. ------------------------------------------------------------------------------------------------------------------- No results for input(s): DDIMER in the last 72 hours. -------------------------------------------------------------------------------------------------------------------  Cardiac Enzymes  Recent Labs Lab 12/25/14 0901  TROPONINI 0.06*   ------------------------------------------------------------------------------------------------------------------ Invalid input(s): POCBNP  ---------------------------------------------------------------------------------------------------------------  Urinalysis    Component Value Date/Time   COLORURINE YELLOW* 12/25/2014 0901   COLORURINE RED 06/14/2014 0837   APPEARANCEUR TURBID* 12/25/2014 0901   APPEARANCEUR BLOODY 06/14/2014 0837   LABSPEC 1.012 12/25/2014 0901   LABSPEC 1.020 06/14/2014 0837   PHURINE 6.0 12/25/2014 0901   PHURINE see comment 06/14/2014 0837   GLUCOSEU NEGATIVE 12/25/2014 0901   GLUCOSEU see comment 06/14/2014 0837   HGBUR 2+* 12/25/2014 0901   HGBUR see comment 06/14/2014 0837   BILIRUBINUR NEGATIVE 12/25/2014 0901   BILIRUBINUR see comment 06/14/2014 [place] NEGATIVE 12/25/2014 0901   KETONESUR see comment 06/14/2014 0837   PROTEINUR 100* 12/25/2014 0901   PROTEINUR see comment 06/14/2014 0837   NITRITE NEGATIVE 12/25/2014 0901   NITRITE SEE COMMENT 06/14/2014 9937  LEUKOCYTESUR 3+* 12/25/2014 0901   LEUKOCYTESUR see comment 06/14/2014 0837     RADIOLOGY: Dg Chest Port 1 View  12/25/2014   CLINICAL DATA:  Sepsis. High fever. Taking immunosuppressive medication.  EXAM:  PORTABLE CHEST - 1 VIEW  COMPARISON:  None.  FINDINGS: The cardiac silhouette, mediastinal and hilar contours are within normal limits given the AP projection. Mild tortuosity of the thoracic aorta. The lungs are clear. No pleural effusion. Suspect a hiatal hernia. The bony thorax is intact.  IMPRESSION: No acute cardiopulmonary findings.  Probable hiatal hernia.   Electronically Signed   By: Marijo Sanes M.D.   On: 12/25/2014 10:21     IMPRESSION AND PLAN:  * Sepsis secondary to UTI He was sent with fever, has evidence of elevated white cell count and tachycardia here.  UA is positive with indwelling catheter.  Currently we will give Levaquin, but we will be waiting for the urine culture report as he had multiple UTIs in the past with chronic catheter.  * Acute on chronic renal failure  Creatinine is almost doubled His past reports.  His Foley catheter was blocked which is changed in the emergency room.  Patient also appears dehydrated, I will give normal saline IV fluid and recheck renal function tomorrow  Avoid nephrotoxic medications.  * Dysphagia status post PEG  Dietary consult to resume his tube feeding.  * Psoriatic and yeast skin lesions  Lotrimine and triamcinolone creams.  All the records are reviewed and case discussed with ED provider. Management plans discussed with the patient, family and they are in agreement.  CODE STATUS: full   TOTAL TIME TAKING CARE OF THIS PATIENT:50 minutes.    Vaughan Basta M.D on 12/25/2014   Between 7am to 6pm - Pager - [phone]  After 6pm go to [email or web address] - password [email or web address]  Office  [phone]  CC: Primary care physician; [name], MD

## 2014-12-25 NOTE — Progress Notes (Signed)
Patient active with skilled nursing and physical therapy services through [facility]. Will need resumption orders at discharge.

## 2014-12-25 NOTE — Progress Notes (Signed)
Initial Nutrition Assessment    INTERVENTION:   Coordination of Care: discussed nutritional poc with MD Vachanni;writer  let MD know that pt does take oral diet at baseline; MD ok to reorder baseline diet (per Berenice Primas at [facility], pt eats regular diet with nectar thick liquids); will order Dysphagia III, Nectar Thick diet at this time. Will also place orders for supplemental TF between meals if pt eats <50% of meal tray; will order Jevity 1.5 TID between meals as tolerated (each can provides 355 kcals, 15 g of protein). Will assess appropriateness of this on follow-up   NUTRITION DIAGNOSIS:   Inadequate oral intake related to dysphagia as evidenced by NPO status.   GOAL:   Patient will meet greater than or equal to 90% of their needs   MONITOR:    (Energy Intake, EN, Digestive System, Electrolyte/Renal Profile) Consult Enteral/tube feeding initiation and management  ASSESSMENT:    Pt admitted with fever, sepsis due to UTI, acute on chronic renal failure; pt with hx of mental retardation. Able to answer basic questions only. Pt has G-tube  Past Medical History  Diagnosis Date  . Difficulty walking   . Muscle weakness   . Dysphagia, oropharyngeal   . Reflux   . Cancer     renal cell carcinoma  . Mental retardation   . Psoriasis     Diet Order:  NPO  Food and Nutrition related history: spoke with Berenice Primas at [facility] where pt resides; per Berenice Primas, pt takes a regular diet with thickened liquids (nectar consistency), although he does not like the thickened liquids. Pt receives 1 can of Jevity 1.5 at a meal time if he eats <50% of that meal. Berenice Primas also mentioned at one point that pt was receiving night time feedings but sounded like the pt does not really do this anymore. Berenice Primas reports that up until a day or 2 ago, pt was eating fairly well.   Skin:   no pressure ulcer noted  Last BM:   unknown  Electrolyte and Renal Profile:  Recent Labs Lab 12/25/14 0901   BUN 35*  CREATININE 2.42*  NA 140  K 4.1   Glucose Profile: No results for input(s): GLUCAP in the last 72 hours. Protein Profile:   Recent Labs Lab 12/25/14 0901  ALBUMIN 3.5   Meds: NS at 75 ml/hr, MVI  Height:   Ht Readings from Last 1 Encounters:  12/25/14 5\' 10"  (1.778 m)    Weight:  Berenice Primas reports that pt has gained weight since residing with him; when he first moved in he weighed 131 pounds and is currently up to 161 pounds. Will request new weight on this admission  Wt Readings from Last 1 Encounters:  12/25/14 180 lb (81.647 kg)    BMI:  Body mass index is 25.83 kg/(m^2).  Estimated Nutritional Needs:   Kcal:  9450-3888 kcals (BEE 1565, 1.3 AF, 1.0-1.2 IF)   Protein:  82-98 g (1.0-1.2 g/kg)   Fluid:  2050-2460 mL (25-30 ml/kg)    HIGH Care Level  Kerman Passey MS, RD, LDN [phone] Pager

## 2014-12-25 NOTE — Progress Notes (Signed)
Patient has orders for aztreonam 2g IV x1. Pharmacy consulted to renally adjust antibiotics; therefore, adjusted order to aztreonam 1g IV x1 per UpToDate based on patient's renal function.  Estimated Creatinine Clearance: 28.5 mL/min (by C-G formula based on Cr of 2.42).  Will continue to follow and adjust dose according to changes in renal function.  Roe Coombs, PharmD Clinical Pharmacist 12/25/14

## 2014-12-25 NOTE — Progress Notes (Signed)
Pt admitted to [facility] from the ED after several days of being febrile, tachypneic and tachycardic. VSS upon admission. Pt has a PEG tube, dysphagia diet ordered and tube feedings to resume when pt eats less than 50%. Pt ate >50% of dinner this shift. Foley catheter in place, alert to person only, pt resides at [facility] (contact is Berenice Primas [phone]). Villa Herb RN

## 2014-12-25 NOTE — ED Provider Notes (Signed)
[facility] Emergency Department Provider Note  ____________________________________________  Time seen: On arrival, the EMS  I have reviewed the triage vital signs and the nursing notes.   HISTORY  Chief Complaint Fever  Unable to obtain full history due to altered mental status  HPI Matthew Poole is a 74 y.o. male who arrives via EMS from group home for reported fever and rapid breathing. Patient is unable to provide significant history on his own. He has a PEG tube, a chronic indwelling Foley and is apparently on methotrexate as well.      Past Medical History  Diagnosis Date  . Difficulty walking   . Muscle weakness   . Dysphagia, oropharyngeal   . Reflux     There are no active problems to display for this patient.   No past surgical history on file.  Current Outpatient Rx  Name  Route  Sig  Dispense  Refill  . acetaminophen (TYLENOL) 325 MG tablet   Oral   Take 325 mg by mouth every 4 (four) hours as needed for mild pain, moderate pain, fever or headache.         . chlorhexidine (PERIDEX) 0.12 % solution   Mouth/Throat   Use as directed 0.5 mLs in the mouth or throat 2 (two) times daily.          . cholecalciferol (VITAMIN D) 1000 UNITS tablet   Oral   Take 1,000 Units by mouth daily.         . clotrimazole (LOTRIMIN) 1 % cream   Topical   Apply 1 application topically 2 (two) times daily.         Marland Kitchen docusate (COLACE) 50 MG/5ML liquid   Oral   Take 50 mg by mouth 2 (two) times daily as needed for mild constipation.         Marland Kitchen doxazosin (CARDURA) 2 MG tablet   Oral   Take 2 mg by mouth daily.         . folic acid (FOLVITE) 1 MG tablet   Oral   Take 5 mg by mouth See admin instructions. Takes everyday BUT Saturday         . methotrexate (RHEUMATREX) 2.5 MG tablet   Oral   Take 7.5 mg by mouth once a week. Patient takes on Saturday. Caution:Chemotherapy. Protect from light.         . Multiple  Vitamins-Minerals (MULTIVITAMIN WITH IRON-MINERALS) liquid   Oral   Take 15 mLs by mouth daily.          . nitrofurantoin, macrocrystal-monohydrate, (MACROBID) 100 MG capsule   Oral   Take 100 mg by mouth daily.          Marland Kitchen omeprazole (PRILOSEC) 20 MG capsule   Oral   Take 20 mg by mouth 2 (two) times daily before a meal.          . Probiotic Product (4X PROBIOTIC PO)   Oral   Take 1 capsule by mouth at bedtime.         . triamcinolone cream (KENALOG) 0.1 %   Topical   Apply 1 application topically 2 (two) times daily.         . vitamin C (ASCORBIC ACID) 250 MG tablet   Oral   Take 500 mg by mouth daily.           Allergies Penicillins and Penicillin v  No family history on file.  Social History Social History  Substance Use  Topics  . Smoking status: Former Research scientist (life sciences)  . Smokeless tobacco: Not on file  . Alcohol Use: No    Review of Systems  Unable to obtain review of systems secondary to altered mental status    ____________________________________________   PHYSICAL EXAM:  VITAL SIGNS: ED Triage Vitals  Enc Vitals Group     BP 12/25/14 0852 164/92 mmHg     Pulse Rate 12/25/14 0852 122     Resp --      Temp 12/25/14 0852 99 F (37.2 C)     Temp Source 12/25/14 0852 Oral     SpO2 12/25/14 0852 97 %     Weight 12/25/14 0852 180 lb (81.647 kg)     Height 12/25/14 0852 5\' 10"  (1.778 m)     Head Cir --      Peak Flow --      Pain Score --      Pain Loc --      Pain Edu? --      Excl. in [place]? --      Constitutional: Chronically ill-appearing, tachypnea Eyes: Conjunctivae are normal.  ENT   Head: Normocephalic and atraumatic.   Mouth/Throat: Mucous membranes are dry Cardiovascular: Tachycardia, regular rhythm. Normal and symmetric distal pulses are present in all extremities.  Respiratory: Positive tachypnea, no retractions. Breath sounds with Rales bibasilarly Gastrointestinal: G-tube with inflamed skin surrounding the flange but  also with some clear discharge. No distention. There is no CVA tenderness. Genitourinary: Chronic Foley, no erythema or infection noted Musculoskeletal: Patient appears to move all extremities Neurologic:   No gross focal neurologic deficits are appreciated. Skin:  Skin is warm, dry and intact. No rash noted. Psychiatric: Patient is calm  ____________________________________________    LABS (pertinent positives/negatives)  Labs Reviewed  LACTIC ACID, PLASMA - Abnormal; Notable for the following:    Lactic Acid, Venous 3.8 (*)    All other components within normal limits  COMPREHENSIVE METABOLIC PANEL - Abnormal; Notable for the following:    CO2 20 (*)    Glucose, Bld 177 (*)    BUN 35 (*)    Creatinine, Ser 2.42 (*)    ALT 8 (*)    GFR calc non Af Amer 25 (*)    GFR calc Af Amer 29 (*)    All other components within normal limits  CBC WITH DIFFERENTIAL/PLATELET - Abnormal; Notable for the following:    WBC 16.5 (*)    RBC 4.07 (*)    Neutro Abs 14.7 (*)    Lymphs Abs 0.7 (*)    Basophils Absolute 0.3 (*)    All other components within normal limits  TROPONIN I - Abnormal; Notable for the following:    Troponin I 0.06 (*)    All other components within normal limits  URINALYSIS COMPLETEWITH MICROSCOPIC ([facility] ONLY) - Abnormal; Notable for the following:    Color, Urine YELLOW (*)    APPearance TURBID (*)    Hgb urine dipstick 2+ (*)    Protein, ur 100 (*)    Leukocytes, UA 3+ (*)    All other components within normal limits  CULTURE, BLOOD (ROUTINE X 2)  CULTURE, BLOOD (ROUTINE X 2)  URINE CULTURE  WOUND CULTURE  LIPASE, BLOOD  LACTIC ACID, PLASMA    ____________________________________________   EKG  ED ECG REPORT I, Lavonia Drafts, the attending physician, personally viewed and interpreted this ECG.   Date: 12/25/2014  EKG Time: 8:54 AM  Rate: 115  Rhythm: sinus tachycardia  Axis: Normal  Intervals:none  ST&T Change: Nonspecific ST and T-wave  abnormalities   ____________________________________________    RADIOLOGY I have personally reviewed any xrays that were ordered on this patient: Chest x-ray unremarkable  ____________________________________________   PROCEDURES  Procedure(s) performed: none  Critical Care performed: yes  CRITICAL CARE Performed by: Lavonia Drafts   Total critical care time: 30 min Critical care time was exclusive of separately billable procedures and treating other patients.  Critical care was necessary to treat or prevent imminent or life-threatening deterioration.  Critical care was time spent personally by me on the following activities: development of treatment plan with patient and/or surrogate as well as nursing, discussions with consultants, evaluation of patient's response to treatment, examination of patient, obtaining history from patient or surrogate, ordering and performing treatments and interventions, ordering and review of laboratory studies, ordering and review of radiographic studies, pulse oximetry and re-evaluation of patient's condition.   ____________________________________________   INITIAL IMPRESSION / ASSESSMENT AND PLAN / ED COURSE  Pertinent labs & imaging results that were available during my care of the patient were reviewed by me and considered in my medical decision making (see chart for details).  Patient with presentation concerning for sepsis. He has an elevated oral temperature, tachycardia and his tachypnea. We will initiate sepsis protocol and call a code sepsis. I will await chest x-ray and urinalysis prior to starting antibiotics  ----------------------------------------- 10:27 AM on 12/25/2014 -----------------------------------------  I started the patient on Levaquin and aztreonam as per penicillin allergic guidelines for urinary tract infection which  I suspect is the source. Patient has received appropriate fluid resuscitation. His lactate is  elevated, his white count is elevated. I will admit the patient ____________________________________________   FINAL CLINICAL IMPRESSION(S) / ED DIAGNOSES  Final diagnoses:  Sepsis, due to unspecified organism  Urinary tract infection associated with catheterization of urinary tract, initial encounter  Acute renal failure, unspecified acute renal failure type     Lavonia Drafts, MD 12/25/14 1027

## 2014-12-25 NOTE — Progress Notes (Signed)
ANTIBIOTIC CONSULT NOTE - INITIAL  Pharmacy Consult for Levaquin Indication: urosepsis   Allergies  Allergen Reactions  . Penicillins Itching and Nausea And Vomiting  . Penicillin V Rash    unknown    Patient Measurements: Height: 5\' 10"  (177.8 cm) Weight: 180 lb (81.647 kg) IBW/kg (Calculated) : 73  Vital Signs: Temp: 99 F (37.2 C) (08/12 0852) Temp Source: Oral (08/12 0852) BP: 128/69 mmHg (08/12 1115) Pulse Rate: 75 (08/12 1115)  Labs:  Recent Labs  12/25/14 0901  WBC 16.5*  HGB 13.2  PLT 192  CREATININE 2.42*   Estimated Creatinine Clearance: 28.5 mL/min (by C-G formula based on Cr of 2.42). No results for input(s): VANCOTROUGH, VANCOPEAK, VANCORANDOM, GENTTROUGH, GENTPEAK, GENTRANDOM, TOBRATROUGH, TOBRAPEAK, TOBRARND, AMIKACINPEAK, AMIKACINTROU, AMIKACIN in the last 72 hours.   Microbiology: No results found for this or any previous visit (from the past 720 hour(s)).  Medical History: Past Medical History  Diagnosis Date  . Difficulty walking   . Muscle weakness   . Dysphagia, oropharyngeal   . Reflux   . Cancer     renal cell carcinoma  . Mental retardation   . Psoriasis    Assessment: Pharmacy consulted to dose levaquin in this 74 year old male with suspected urosepsis and AKI.  Patient also received aztreonam 1gm x 1 in ED  Plan:  Patient given levaquin 750mg  IV x 1 in ED. Will order levaquin 750mg  IV Q48H based on renal function  Pharmacy to follow per consult.  Rexene Edison, PharmD Clinical Pharmacist  12/25/2014,11:48 AM

## 2014-12-26 LAB — BASIC METABOLIC PANEL
Anion gap: 8 (ref 5–15)
BUN: 24 mg/dL — ABNORMAL HIGH (ref 6–20)
CALCIUM: 8.4 mg/dL — AB (ref 8.9–10.3)
CO2: 23 mmol/L (ref 22–32)
CREATININE: 1.65 mg/dL — AB (ref 0.61–1.24)
Chloride: 111 mmol/L (ref 101–111)
GFR calc Af Amer: 46 mL/min — ABNORMAL LOW (ref >60)
GFR calc non Af Amer: 40 mL/min — ABNORMAL LOW (ref >60)
Glucose, Bld: 114 mg/dL — ABNORMAL HIGH (ref 65–99)
Potassium: 3.9 mmol/L (ref 3.5–5.1)
Sodium: 142 mmol/L (ref 135–145)

## 2014-12-26 LAB — CBC
HEMATOCRIT: 35 % — AB (ref 40.0–52.0)
HEMOGLOBIN: 11.3 g/dL — AB (ref 13.0–18.0)
MCH: 32.3 pg (ref 26.0–34.0)
MCHC: 32.3 g/dL (ref 32.0–36.0)
MCV: 100.2 fL — AB (ref 80.0–100.0)
PLATELETS: 160 10*3/uL (ref 150–440)
RBC: 3.49 MIL/uL — ABNORMAL LOW (ref 4.40–5.90)
RDW: 14.1 % (ref 11.5–14.5)
WBC: 12.1 10*3/uL — ABNORMAL HIGH (ref 3.8–10.6)

## 2014-12-26 MED ORDER — FREE WATER
200.0000 mL | Freq: Three times a day (TID) | Status: DC
  Administered 2014-12-26 – 2014-12-29 (×11): 200 mL

## 2014-12-26 NOTE — Progress Notes (Signed)
Nutrition Follow-up     INTERVENTION:   Meals/Snacks: cater to pt preferences Medical Food Supplement: will add Mighty Shakes on meal trays to maximize po intake EN: continue current TF regimen of administration of 1 can of Jevity 1.5 after meal time if pt eats <50%. Pt has not received TF bolus yet as pt has eaten >50% of meals thus far. Recommend addition of free water flushes to maintain patency of tube and to assist with meeting hydration needs   NUTRITION DIAGNOSIS:   Inadequate oral intake related to dysphagia as evidenced by NPO status. Improving as pt tolerating >50% of meals, adding supplement   GOAL:   Patient will meet greater than or equal to 90% of their needs   MONITOR:    (Energy Intake, EN, Digestive System, Electrolyte/Renal Profile)  ASSESSMENT:     Diet Order:  DIET DYS 3 Room service appropriate?: Yes; Fluid consistency:: Nectar Thick   Energy Intake: pt ate 75% at dinner last night, therefore pt did not receive bolus feeding at 8pm.   Last BM:   8/12  Electrolyte and Renal Profile:  Recent Labs Lab 12/25/14 0901 12/26/14 0553  BUN 35* 24*  CREATININE 2.42* 1.65*  NA 140 142  K 4.1 3.9   Glucose Profile: No results for input(s): GLUCAP in the last 72 hours. Nutritional Anemia Profile:  CBC Latest Ref Rng 12/26/2014 12/25/2014 06/17/2014  WBC 3.8 - 10.6 K/uL 12.1(H) 16.5(H) 10.9(H)  Hemoglobin 13.0 - 18.0 g/dL 11.3(L) 13.2 11.7(L)  Hematocrit 40.0 - 52.0 % 35.0(L) 40.1 34.2(L)  Platelets 150 - 440 K/uL 160 192 149(L)    Meds: NS at 75 ml/hr, MVI  Height:   Ht Readings from Last 1 Encounters:  12/25/14 5\' 10"  (1.778 m)    Weight: noted new weight, as previously documented pt weight of 161 pounds per [facility] at [facility]  Wt Readings from Last 1 Encounters:  12/26/14 158 lb 15.2 oz (72.1 kg)    BMI:  Body mass index is 22.81 kg/(m^2).  Estimated Nutritional Needs:   Kcal:  0076-2263 kcals (BEE 1565, 1.3 AF, 1.0-1.2 IF)    Protein:  82-98 g (1.0-1.2 g/kg)   Fluid:  2050-2460 mL (25-30 ml/kg)    MODERATE Care Level  Kerman Passey MS, RD, LDN [phone] Pager

## 2014-12-26 NOTE — Progress Notes (Signed)
Matthew Poole is a 74 y.o. male  Sepsis secondary to UTI   SUBJECTIVE:  Pt admitted with presumed Urosepsis.Doing better. Afebrile. VSS. WBC improving. Cultures pending. No complaints this AM.  ______________________________________________________________________  ROS: Review of systems is unremarkable for any active cardiac,respiratory, GI, GU, hematologic, neurologic or psychiatric systems, 10 systems reviewed.  @CMEDLIST @  Past Medical History  Diagnosis Date  . Difficulty walking   . Muscle weakness   . Dysphagia, oropharyngeal   . Reflux   . Cancer     renal cell carcinoma  . Mental retardation   . Psoriasis     History reviewed. No pertinent past surgical history.  PHYSICAL EXAM:  BP 139/49 mmHg  Pulse 51  Temp(Src) 98.3 F (36.8 C) (Oral)  Resp 16  Ht 5\' 10"  (1.778 m)  Wt 72.1 kg (158 lb 15.2 oz)  BMI 22.81 kg/m2  SpO2 99%  Wt Readings from Last 3 Encounters:  12/26/14 72.1 kg (158 lb 15.2 oz)            Constitutional: NAD Neck: supple, no thyromegaly Respiratory: CTA, no rales or wheezes Cardiovascular: RRR, no murmur, no gallop Abdomen: soft, good BS, nontender Extremities: no edema Neuro: alert and oriented, no focal motor or sensory deficits  ASSESSMENT/PLAN:  Labs and imaging studies were reviewed  No change in care. Continue IV ABX and IV fluids. Repeat labs in Am. F/u on culture results later today.

## 2014-12-27 LAB — CBC WITH DIFFERENTIAL/PLATELET
BASOS PCT: 1 %
Basophils Absolute: 0.1 10*3/uL (ref 0–0.1)
EOS PCT: 4 %
Eosinophils Absolute: 0.4 10*3/uL (ref 0–0.7)
HEMATOCRIT: 35.2 % — AB (ref 40.0–52.0)
HEMOGLOBIN: 11.8 g/dL — AB (ref 13.0–18.0)
Lymphocytes Relative: 19 %
Lymphs Abs: 1.7 10*3/uL (ref 1.0–3.6)
MCH: 33.1 pg (ref 26.0–34.0)
MCHC: 33.4 g/dL (ref 32.0–36.0)
MCV: 99.3 fL (ref 80.0–100.0)
Monocytes Absolute: 0.6 10*3/uL (ref 0.2–1.0)
Monocytes Relative: 7 %
NEUTROS ABS: 6.3 10*3/uL (ref 1.4–6.5)
Neutrophils Relative %: 69 %
Platelets: 164 10*3/uL (ref 150–440)
RBC: 3.55 MIL/uL — ABNORMAL LOW (ref 4.40–5.90)
RDW: 13.9 % (ref 11.5–14.5)
WBC: 9.1 10*3/uL (ref 3.8–10.6)

## 2014-12-27 LAB — URINE CULTURE: Culture: >=100000

## 2014-12-27 LAB — COMPREHENSIVE METABOLIC PANEL
ALK PHOS: 76 U/L (ref 38–126)
ALT: 9 U/L — AB (ref 17–63)
ANION GAP: 6 (ref 5–15)
AST: 18 U/L (ref 15–41)
Albumin: 2.8 g/dL — ABNORMAL LOW (ref 3.5–5.0)
BILIRUBIN TOTAL: 0.7 mg/dL (ref 0.3–1.2)
BUN: 27 mg/dL — AB (ref 6–20)
CALCIUM: 8.3 mg/dL — AB (ref 8.9–10.3)
CHLORIDE: 110 mmol/L (ref 101–111)
CO2: 24 mmol/L (ref 22–32)
CREATININE: 1.37 mg/dL — AB (ref 0.61–1.24)
GFR calc Af Amer: 58 mL/min — ABNORMAL LOW (ref >60)
GFR calc non Af Amer: 50 mL/min — ABNORMAL LOW (ref >60)
GLUCOSE: 102 mg/dL — AB (ref 65–99)
Potassium: 3.6 mmol/L (ref 3.5–5.1)
Sodium: 140 mmol/L (ref 135–145)
TOTAL PROTEIN: 5.7 g/dL — AB (ref 6.5–8.1)

## 2014-12-27 MED ORDER — LEVOFLOXACIN IN D5W 750 MG/150ML IV SOLN
750.0000 mg | INTRAVENOUS | Status: DC
  Administered 2014-12-27 – 2014-12-28 (×2): 750 mg via INTRAVENOUS
  Filled 2014-12-27 (×3): qty 150

## 2014-12-27 NOTE — Progress Notes (Signed)
Matthew Poole is a 74 y.o. male  Sepsis secondary to UTI   SUBJECTIVE:  Pt in NAD. No complaints. Denies CP or SOB. Afebrile. WBC now normal. VSS. Blood cultures NTD. Urine culture growing gram negative rods.  ______________________________________________________________________  ROS: Review of systems is unremarkable for any active cardiac,respiratory, GI, GU, hematologic, neurologic or psychiatric systems, 10 systems reviewed.  @CMEDLIST @  Past Medical History  Diagnosis Date  . Difficulty walking   . Muscle weakness   . Dysphagia, oropharyngeal   . Reflux   . Cancer     renal cell carcinoma  . Mental retardation   . Psoriasis     History reviewed. No pertinent past surgical history.  PHYSICAL EXAM:  BP 147/52 mmHg  Pulse 56  Temp(Src) 98.4 F (36.9 C) (Oral)  Resp 18  Ht 5\' 10"  (1.778 m)  Wt 73.12 kg (161 lb 3.2 oz)  BMI 23.13 kg/m2  SpO2 97%  Wt Readings from Last 3 Encounters:  12/27/14 73.12 kg (161 lb 3.2 oz)            Constitutional: NAD Neck: supple, no thyromegaly Respiratory: CTA, no rales or wheezes Cardiovascular: RRR, no murmur, no gallop Abdomen: soft, good BS, nontender Extremities: no edema Neuro: alert and oriented, no focal motor or sensory deficits  ASSESSMENT/PLAN:  Labs and imaging studies were reviewed  No change in care. F/u on culture results. Continue IV ABX. PT eval today. Repeat labs in AM. Possible D/C in 1-2 days.

## 2014-12-27 NOTE — Plan of Care (Signed)
Problem: Phase I Progression Outcomes Goal: Voiding-avoid urinary catheter unless indicated Outcome: Not Applicable Date Met:  88/71/95 foley  Comments:  foley

## 2014-12-27 NOTE — Evaluation (Signed)
Physical Therapy Evaluation Patient Details Name: Matthew Poole MRN: [id number] DOB: [birth year] Today's Date: 12/27/2014   History of Present Illness  Patient is a 74 y/o male that presents to [facility] with fever and tachycardia. Patient has previously been living in a group home. Patient has a past medical history significant for mental retardation, renal cell carcinoma, PEG placement, and indwelling catheter.   Clinical Impression  Patient is a 74 y/o male that presents from a group home. He has cognitive deficits at baseline, and does not provide much in the way of history but he appears to be roughly at his baseline mobility. He requires minimal assist to transfer sit to stand, however he displays acceptable balance and gait mechanics with RW. His modified DGI score indicates he is at high falls risk and would benefit from additional strengthening and balance activities upon discharge from this acute hospitalization. Skilled acute PT services are indicated at this time to address the above deficits.     Follow Up Recommendations Home health PT    Equipment Recommendations  Rolling walker with 5" wheels (Need to make sure he has a RW, he is a poor historian and will certainly need a RW)    Recommendations for Other Services       Precautions / Restrictions Precautions Precautions: Fall Restrictions Weight Bearing Restrictions: No      Mobility  Bed Mobility               General bed mobility comments: Patient received in sitting.   Transfers Overall transfer level: Needs assistance Equipment used: Rolling walker (2 wheeled) Transfers: Sit to/from Stand Sit to Stand: Min assist         General transfer comment: Patient requires multiple attempts to get to standing, however once standing he has no loss of balance.   Ambulation/Gait Ambulation/Gait assistance: Supervision Ambulation Distance (Feet): 200 Feet Assistive device: Rolling walker (2 wheeled) Gait  Pattern/deviations: Trunk flexed;Decreased step length - left;Decreased step length - right;Step-through pattern;Wide base of support   Gait velocity interpretation: Below normal speed for age/gender General Gait Details: Patient states this is his baseline ambulation during evaluation today. Patient displays flexed trunk initially with gait, however he responds to cuing to ambulate closer to the walker. No loss of balance during ambulation.   Stairs            Wheelchair Mobility    Modified Rankin (Stroke Patients Only)       Balance Overall balance assessment: Needs assistance Sitting-balance support: Feet unsupported Sitting balance-Leahy Scale: Normal     Standing balance support: Bilateral upper extremity supported Standing balance-Leahy Scale: Fair Standing balance comment: Patient has modified DGI score of 8/12 indicating he is at high falls risk. No overt loss of balance displayed with turning, navigating hallways, or changing speeds during this evaluation.                              Pertinent Vitals/Pain Pain Assessment:  (Patient does not complain of any pain during this evaluation.)    Home Living Family/patient expects to be discharged to:: Group home                      Prior Function Level of Independence: Independent with assistive device(s)         Comments: Patient states he was ambulating with RW prior to this admission.      Hand Dominance  Extremity/Trunk Assessment   Upper Extremity Assessment: Overall WFL for tasks assessed           Lower Extremity Assessment: Overall WFL for tasks assessed      Cervical / Trunk Assessment: Kyphotic  Communication   Communication: No difficulties;Expressive difficulties (Patient has simple vocabulary it appears from this exam. )  Cognition Arousal/Alertness: Awake/alert Behavior During Therapy: WFL for tasks assessed/performed (Follows all commands, no impulsive  behavior. ) Overall Cognitive Status: History of cognitive impairments - at baseline                      General Comments      Exercises        Assessment/Plan    PT Assessment Patient needs continued PT services  PT Diagnosis Difficulty walking   PT Problem List Decreased strength;Decreased knowledge of use of DME;Decreased safety awareness;Decreased balance;Decreased mobility  PT Treatment Interventions DME instruction;Gait training;Neuromuscular re-education;Therapeutic activities;Therapeutic exercise;Stair training   PT Goals (Current goals can be found in the Care Plan section) Acute Rehab PT Goals Patient Stated Goal: To return to his previous living establishment  PT Goal Formulation: With patient Time For Goal Achievement: 01/10/15 Potential to Achieve Goals: Good    Frequency Min 2X/week   Barriers to discharge        Co-evaluation               End of Session Equipment Utilized During Treatment: Gait belt Activity Tolerance: Patient tolerated treatment well Patient left: in chair;with call bell/phone within reach;with chair alarm set Nurse Communication: Mobility status         Time: 1250-1305 PT Time Calculation (min) (ACUTE ONLY): 15 min   Charges:   PT Evaluation $Initial PT Evaluation Tier I: 1 Procedure     PT G Codes:       Kerman Passey, PT, DPT    12/27/2014, 1:23 PM

## 2014-12-27 NOTE — Progress Notes (Signed)
ANTIBIOTIC CONSULT NOTE - FOLLOW UP   Pharmacy Consult for Levaquin Indication: urosepsis   Allergies  Allergen Reactions  . Penicillins Itching and Nausea And Vomiting  . Penicillin V Rash    unknown    Patient Measurements: Height: 5\' 10"  (177.8 cm) Weight: 161 lb 3.2 oz (73.12 kg) IBW/kg (Calculated) : 73  Vital Signs: Temp: 98.8 F (37.1 C) (08/14 0823) Temp Source: Oral (08/14 0823) BP: 155/63 mmHg (08/14 0823) Pulse Rate: 58 (08/14 0823)  Labs:  Recent Labs  12/25/14 0901 12/26/14 0553 12/27/14 0513  WBC 16.5* 12.1* 9.1  HGB 13.2 11.3* 11.8*  PLT 192 160 164  CREATININE 2.42* 1.65* 1.37*   Estimated Creatinine Clearance: 50.3 mL/min (by C-G formula based on Cr of 1.37). No results for input(s): VANCOTROUGH, VANCOPEAK, VANCORANDOM, GENTTROUGH, GENTPEAK, GENTRANDOM, TOBRATROUGH, TOBRAPEAK, TOBRARND, AMIKACINPEAK, AMIKACINTROU, AMIKACIN in the last 72 hours.   Microbiology: Recent Results (from the past 720 hour(s))  Blood Culture (routine x 2)     Status: None (Preliminary result)   Collection Time: 12/25/14  9:00 AM  Result Value Ref Range Status   Specimen Description BLOOD RIGHT ARM  Final   Special Requests BOTTLES DRAWN AEROBIC AND ANAEROBIC  2CC  Final   Culture NO GROWTH 2 DAYS  Final   Report Status PENDING  Incomplete  Blood Culture (routine x 2)     Status: None (Preliminary result)   Collection Time: 12/25/14  9:01 AM  Result Value Ref Range Status   Specimen Description BLOOD LEFT ARM  Final   Special Requests   Final    BOTTLES DRAWN AEROBIC AND ANAEROBIC  AER 4CC ANA 1CC   Culture NO GROWTH 2 DAYS  Final   Report Status PENDING  Incomplete  Urine culture     Status: None (Preliminary result)   Collection Time: 12/25/14  9:01 AM  Result Value Ref Range Status   Specimen Description URINE, RANDOM  Final   Special Requests NONE  Final   Culture   Final    >=100,000 COLONIES/mL GRAM NEGATIVE RODS IDENTIFICATION AND SUSCEPTIBILITIES TO  FOLLOW    Report Status PENDING  Incomplete  Wound culture     Status: None (Preliminary result)   Collection Time: 12/25/14  9:30 AM  Result Value Ref Range Status   Specimen Description ABDOMEN  Final   Special Requests NONE  Final   Gram Stain   Final    FEW WBC SEEN FEW GRAM POSITIVE COCCI IN CLUSTERS FEW GRAM NEGATIVE RODS    Culture   Final    MODERATE GROWTH ESCHERICHIA COLI HEAVY GROWTH STAPHYLOCOCCUS AUREUS SUSCEPTIBILITIES TO FOLLOW TRYING TO ISOLATE OTHER POSSIBLE PATHOGENS    Report Status PENDING  Incomplete   Organism ID, Bacteria ESCHERICHIA COLI  Final      Susceptibility   Escherichia coli - MIC*    AMPICILLIN <=2 SENSITIVE Sensitive     CEFAZOLIN <=4 SENSITIVE Sensitive     CEFTRIAXONE <=1 SENSITIVE Sensitive     CIPROFLOXACIN >=4 RESISTANT Resistant     GENTAMICIN <=1 SENSITIVE Sensitive     IMIPENEM <=0.25 SENSITIVE Sensitive     NITROFURANTOIN <=16 SENSITIVE Sensitive     TRIMETH/SULFA <=20 SENSITIVE Sensitive     PIP/TAZO Value in next row Sensitive      SENSITIVE<=4    * MODERATE GROWTH ESCHERICHIA COLI    Medical History: Past Medical History  Diagnosis Date  . Difficulty walking   . Muscle weakness   . Dysphagia, oropharyngeal   .  Reflux   . Cancer     renal cell carcinoma  . Mental retardation   . Psoriasis    Assessment: Pharmacy consulted to dose levaquin in this 74 year old male with suspected urosepsis and AKI.  Patient also received aztreonam 1gm x 1 in ED  Plan:  Patient renal function has improved. Will change to levofloxacin 750 mg IV q24 hours.   Pharmacy to follow per consult.  Larene Beach, PharmD  Clinical Pharmacist  12/27/2014,11:15 AM

## 2014-12-28 LAB — COMPREHENSIVE METABOLIC PANEL
ALT: 10 U/L — ABNORMAL LOW (ref 17–63)
ANION GAP: 8 (ref 5–15)
AST: 23 U/L (ref 15–41)
Albumin: 2.7 g/dL — ABNORMAL LOW (ref 3.5–5.0)
Alkaline Phosphatase: 75 U/L (ref 38–126)
BUN: 31 mg/dL — ABNORMAL HIGH (ref 6–20)
CHLORIDE: 107 mmol/L (ref 101–111)
CO2: 24 mmol/L (ref 22–32)
CREATININE: 1.44 mg/dL — AB (ref 0.61–1.24)
Calcium: 8.6 mg/dL — ABNORMAL LOW (ref 8.9–10.3)
GFR, EST AFRICAN AMERICAN: 55 mL/min — AB (ref >60)
GFR, EST NON AFRICAN AMERICAN: 47 mL/min — AB (ref >60)
Glucose, Bld: 107 mg/dL — ABNORMAL HIGH (ref 65–99)
POTASSIUM: 4 mmol/L (ref 3.5–5.1)
Sodium: 139 mmol/L (ref 135–145)
Total Bilirubin: 0.4 mg/dL (ref 0.3–1.2)
Total Protein: 5.7 g/dL — ABNORMAL LOW (ref 6.5–8.1)

## 2014-12-28 LAB — CBC WITH DIFFERENTIAL/PLATELET
Basophils Absolute: 0.1 10*3/uL (ref 0–0.1)
Basophils Relative: 1 %
EOS PCT: 3 %
Eosinophils Absolute: 0.3 10*3/uL (ref 0–0.7)
HCT: 35.7 % — ABNORMAL LOW (ref 40.0–52.0)
Hemoglobin: 12.1 g/dL — ABNORMAL LOW (ref 13.0–18.0)
LYMPHS ABS: 1.7 10*3/uL (ref 1.0–3.6)
LYMPHS PCT: 18 %
MCH: 33.5 pg (ref 26.0–34.0)
MCHC: 33.8 g/dL (ref 32.0–36.0)
MCV: 99.2 fL (ref 80.0–100.0)
MONO ABS: 0.8 10*3/uL (ref 0.2–1.0)
Monocytes Relative: 9 %
Neutro Abs: 6.7 10*3/uL — ABNORMAL HIGH (ref 1.4–6.5)
Neutrophils Relative %: 69 %
PLATELETS: 184 10*3/uL (ref 150–440)
RBC: 3.6 MIL/uL — ABNORMAL LOW (ref 4.40–5.90)
RDW: 14.1 % (ref 11.5–14.5)
WBC: 9.6 10*3/uL (ref 3.8–10.6)

## 2014-12-28 MED ORDER — DEXTROSE 5 % IV SOLN
1.0000 g | Freq: Once | INTRAVENOUS | Status: AC
  Administered 2014-12-28: 1 g via INTRAVENOUS
  Filled 2014-12-28: qty 10

## 2014-12-28 MED ORDER — DEXTROSE 5 % IV SOLN
1.0000 g | INTRAVENOUS | Status: DC
  Filled 2014-12-28: qty 10

## 2014-12-28 NOTE — Progress Notes (Signed)
Matthew Poole is a 74 y.o. male     SUBJECTIVE: 74 y/o male with hx of MR, Renal cell Ca, Chronic indwelling Foley, PEG admitted with Urosepsis This am - not in distress- Denies CP or SOB. Afebrile. WBC now normal. VSS. Blood cultures NTD. Urine culture growing- E.coli  ______________________________________________________________________  ROS: Review of systems is unremarkable for any active cardiac,respiratory, GI, GU, hematologic, neurologic or psychiatric systems, 10 systems reviewed.   Past Medical History  Diagnosis Date  . Difficulty walking   . Muscle weakness   . Dysphagia, oropharyngeal   . Reflux   . Cancer     renal cell carcinoma  . Mental retardation   . Psoriasis     History reviewed. No pertinent past surgical history.  PHYSICAL EXAM:  BP 148/52 mmHg  Pulse 69  Temp(Src) 98.3 F (36.8 C) (Oral)  Resp 17  Ht 5\' 10"  (1.778 m)  Wt 72.258 kg (159 lb 4.8 oz)  BMI 22.86 kg/m2  SpO2 99%  Wt Readings from Last 3 Encounters:  12/28/14 72.258 kg (159 lb 4.8 oz)            Constitutional: NAD Neck: supple, no thyromegaly Respiratory: CTA, no rales or wheezes Cardiovascular: RRR, no murmur, no gallop Abdomen: soft, good BS, nontender Extremities: no edema Neuro: alert and oriented, no focal motor or sensory deficits  ASSESSMENT/PLAN:  1 Sepsis secondary to E.coli  UTI: Resistant to Cipro. Change Levaquin  to IV Rocephin. Discussed with pharmacist  2 Acute on Chronic Renal failure:Se Creat improved to 1.44. Continue IV hydration 3 Dysphagia: Has PEG tube Labs and imaging studies were reviewed  Continue PT. D/cPlanning; To Rehab soon

## 2014-12-28 NOTE — Care Management (Signed)
Spoke with Matthew Poole at [facility]. Anticipate discharge tomorrow per Dr Ginette Pitman. Also contacted [facility] to make aware as well.  [place] has privileges there.[facility] already open to [facility] notified of [facility]. Patient has walker at group home . Discussed with CSW

## 2014-12-28 NOTE — Clinical Social Work Note (Signed)
  Clinical Social Work Assessment  Patient Details  Name: Matthew Poole MRN: [id number] Date of Birth: 02/13/1941  Date of referral:  12/26/14               Reason for consult:  Facility Placement                Permission sought to share information with:  Guardian, Customer service manager Permission granted to share information::  Yes, Verbal Permission Granted  Name::     Matthew Poole [phone]  Agency::  [facility]  Relationship::     Contact Information:     Housing/Transportation Living arrangements for the past 2 months:  [place] of Information:  Patient, Facility Patient Interpreter Needed:  None Criminal Activity/Legal Involvement Pertinent to Current Situation/Hospitalization:  No - Comment as needed Significant Relationships:  Other(Comment) (guardian, group home owner) Lives with:  Facility Resident Do you feel safe going back to the place where you live?  Yes Need for family participation in patient care:  Yes (Comment)  Care giving concerns:  Pt has lived at [facility] for 20 years. They have learned to take care of Pt's feeding needs. [facility] follows Pt for skilled needs at [facility] as well.    Social Worker assessment / plan:  CSW was referred to Pt to assist with dc coordination. CSW is familiar with Pt from previous hospitalizations. Pt has a guardian & friend, Denyse Amass, who is his main support. Pt has lived at the family care home for 20 years, minus a year at [facility] after an acute hospitalization. Pt is cognitively impaired at baseline. Facility has been trained to meet Pt's caregiving needs. Pt and his guardian would like Pt to return to family care home at dc. CSW will follow up to assist as Pt's care progresses.   Employment status:  Disabled (Comment on whether or not currently receiving Disability) Insurance information:  Oceanographer  PT Recommendations:  Home with [place] /  Referral to community resources:     Patient/Family's Response to care:  Pt sitting in chair watching tv. Pt states that he is "feeling better" and will be ready to return to [facility] when the MD discharges him.   Patient/Family's Understanding of and Emotional Response to Diagnosis, Current Treatment, and Prognosis:  Pt states that he has an infection. Facility is aware and believes they can continue to meet Pt's needs [facility].   Emotional Assessment Appearance:  Appears stated age Attitude/Demeanor/Rapport:   (cooperative) Affect (typically observed):  Flat, Appropriate, Accepting Orientation:  Oriented to Self, Oriented to Place, Oriented to  Time, Oriented to Situation Alcohol / Substance use:  Never Used Psych involvement (Current and /or in the community):  No (Comment)  Discharge Needs  Concerns to be addressed:  No discharge needs identified Readmission within the last 30 days:  No Current discharge risk:  Chronically ill, Cognitively Impaired Barriers to Discharge:  No Barriers Identified   Alonna Buckler, LCSW 12/28/2014, 5:32 AM

## 2014-12-28 NOTE — Progress Notes (Signed)
Physical Therapy Treatment Patient Details Name: Matthew Poole MRN: [id number] DOB: 09-Mar-1941 Today's Date: 12/28/2014    History of Present Illness Patient is a 73 y/o male that presents to [facility] with fever and tachycardia. Patient has previously been living in a group home. Patient has a past medical history significant for mental retardation, renal cell carcinoma, PEG placement, and indwelling catheter.     PT Comments    Patient makes excellent progress with ambulation distance and tolerance, though similar mechanics to yesterday. PT continues to encourage patient to ambulate with a more upright posture and use the RW closer to his body, though he reverts to his habits quickly after. Of note his HR was elevated throughout this session initially at 105 and increasing to 124, though his O2 sats were appropriate and no shortness of breath or chest pain was described. Skilled acute PT services continue to be indicated to address his LE weakness.   Follow Up Recommendations  Home health PT     Equipment Recommendations  Rolling walker with 5" wheels (Need to make sure he has a RW, he is a poor historian and will certainly need a RW)    Recommendations for Other Services       Precautions / Restrictions Precautions Precautions: Fall Restrictions Weight Bearing Restrictions: No    Mobility  Bed Mobility Overal bed mobility: Modified Independent             General bed mobility comments: Patient has HOB slightly elevated and uses handrail to transfer supine to sit.   Transfers Overall transfer level: Needs assistance Equipment used: Rolling walker (2 wheeled) Transfers: Sit to/from Stand Sit to Stand: Min guard         General transfer comment: Patient practiced sit to stand transfer x 4 repetitions throughout session, he requires min cuing for hand placement and cga x 1 for balance throughout transfer.  Ambulation/Gait Ambulation/Gait assistance:  Supervision Ambulation Distance (Feet): 530 Feet Assistive device: Rolling walker (2 wheeled) Gait Pattern/deviations: Step-through pattern;Trunk flexed;Wide base of support;Decreased step length - left;Decreased step length - right   Gait velocity interpretation: at or above normal speed for age/gender General Gait Details: Patient ambulates with significant trunk flexion even with cuing for more upright posture and closer positioning of walker to himself. No loss of balance noted during ambulation.    Stairs            Wheelchair Mobility    Modified Rankin (Stroke Patients Only)       Balance                                    Cognition Arousal/Alertness: Awake/alert Behavior During Therapy: WFL for tasks assessed/performed (Follows all commands, no impulsive behavior. ) Overall Cognitive Status: History of cognitive impairments - at baseline                      Exercises General Exercises - Lower Extremity Long Arc Quad: AROM;Both;10 reps Hip Flexion/Marching: Seated;AROM;Both;15 reps Other Exercises Other Exercises: Sit to stand training x 4 repetitions (3 from bed, 1 from chair) with RW and cga x1.     General Comments        Pertinent Vitals/Pain Pain Assessment:  (Patient does not communicate that he has any pain today.)    Home Living  Prior Function            PT Goals (current goals can now be found in the care plan section) Acute Rehab PT Goals Patient Stated Goal: To return to his previous living establishment  PT Goal Formulation: With patient Time For Goal Achievement: 01/10/15 Potential to Achieve Goals: Good Progress towards PT goals: Progressing toward goals    Frequency  Min 2X/week    PT Plan Current plan remains appropriate    Co-evaluation             End of Session Equipment Utilized During Treatment: Gait belt Activity Tolerance: Patient tolerated treatment  well Patient left: in chair;with call bell/phone within reach;with chair alarm set     Time: 2761-4709 PT Time Calculation (min) (ACUTE ONLY): 27 min  Charges:  $Gait Training: 23-37 mins                    G Codes:      Kerman Passey, PT, DPT    12/28/2014, 3:22 PM

## 2014-12-29 LAB — CBC WITH DIFFERENTIAL/PLATELET
Basophils Absolute: 0.1 10*3/uL (ref 0–0.1)
Basophils Relative: 1 %
EOS ABS: 0.3 10*3/uL (ref 0–0.7)
Eosinophils Relative: 3 %
HCT: 36.4 % — ABNORMAL LOW (ref 40.0–52.0)
HEMOGLOBIN: 12.1 g/dL — AB (ref 13.0–18.0)
LYMPHS ABS: 1.7 10*3/uL (ref 1.0–3.6)
LYMPHS PCT: 20 %
MCH: 32.5 pg (ref 26.0–34.0)
MCHC: 33.1 g/dL (ref 32.0–36.0)
MCV: 98.1 fL (ref 80.0–100.0)
Monocytes Absolute: 0.7 10*3/uL (ref 0.2–1.0)
Monocytes Relative: 8 %
NEUTROS PCT: 68 %
Neutro Abs: 6 10*3/uL (ref 1.4–6.5)
Platelets: 196 10*3/uL (ref 150–440)
RBC: 3.71 MIL/uL — AB (ref 4.40–5.90)
RDW: 14.1 % (ref 11.5–14.5)
WBC: 8.6 10*3/uL (ref 3.8–10.6)

## 2014-12-29 LAB — BASIC METABOLIC PANEL
Anion gap: 8 (ref 5–15)
BUN: 34 mg/dL — AB (ref 6–20)
CHLORIDE: 104 mmol/L (ref 101–111)
CO2: 25 mmol/L (ref 22–32)
Calcium: 9 mg/dL (ref 8.9–10.3)
Creatinine, Ser: 1.52 mg/dL — ABNORMAL HIGH (ref 0.61–1.24)
GFR calc non Af Amer: 44 mL/min — ABNORMAL LOW (ref >60)
GFR, EST AFRICAN AMERICAN: 51 mL/min — AB (ref >60)
Glucose, Bld: 111 mg/dL — ABNORMAL HIGH (ref 65–99)
POTASSIUM: 4.1 mmol/L (ref 3.5–5.1)
SODIUM: 137 mmol/L (ref 135–145)

## 2014-12-29 LAB — WOUND CULTURE

## 2014-12-29 MED ORDER — FREE WATER: 200.0000 mL | Freq: Three times a day (TID) | Status: DC

## 2014-12-29 MED ORDER — JEVITY 1.5 CAL/FIBER PO LIQD: 237.0000 mL | Freq: Three times a day (TID) | ORAL | Status: DC

## 2014-12-29 MED ORDER — CEFUROXIME AXETIL 250 MG/5ML PO SUSR: 250.0000 mg | Freq: Two times a day (BID) | ORAL | Status: AC

## 2014-12-29 NOTE — Progress Notes (Signed)
Matthew Poole is a 74 y.o. male     SUBJECTIVE: 74 y/o male with hx of MR, Renal cell Ca, Chronic indwelling Foley, PEG admitted with Urosepsis This am - not in distress- Denies CP or SOB. Afebrile. WBC now normal. VSS. Blood cultures NTD. Urine culture growing- E.coli  ______________________________________________________________________  ROS: Review of systems is unremarkable for any active cardiac,respiratory, GI, GU, hematologic, neurologic or psychiatric systems, 10 systems reviewed.   Past Medical History  Diagnosis Date  . Difficulty walking   . Muscle weakness   . Dysphagia, oropharyngeal   . Reflux   . Cancer     renal cell carcinoma  . Mental retardation   . Psoriasis     History reviewed. No pertinent past surgical history.  PHYSICAL EXAM:  BP 136/53 mmHg  Pulse 69  Temp(Src) 98.3 F (36.8 C) (Oral)  Resp 18  Ht 5\' 10"  (1.778 m)  Wt 74.98 kg (165 lb 4.8 oz)  BMI 23.72 kg/m2  SpO2 100%  Wt Readings from Last 3 Encounters:  12/29/14 74.98 kg (165 lb 4.8 oz)            Constitutional: NAD Neck: supple, no thyromegaly Respiratory: CTA, no rales or wheezes Cardiovascular: RRR, no murmur, no gallop Abdomen: soft, good BS, nontender Extremities: no edema Neuro: alert and oriented, no focal motor or sensory deficits  ASSESSMENT/PLAN:  1 Sepsis secondary to E.coli  UTI: Resistant to Cipro. On  IV Rocephin. 2 Acute on Chronic Renal failure:Se Creat stable- 1.52 Continue IV hydration 3 Dysphagia: Has PEG tube Labs and imaging studies were reviewed  Continue PT. Plan to d/c to Rehab later today

## 2014-12-29 NOTE — Progress Notes (Signed)
Pt Alert to self and place only. VSS. Pt had no complaints of pain or nausea. Pt tolerating diet well and eating more than 50% of meals. Pt has not needed supplement through PEG tube today. Pt had IV removed, while foley was left in place for discharge. Packet of information given to patient for him to give to facility. Report was called to [facility] where he will be returning. Pt was carried down to lobby by nurse tech where his friend will be driving him to care home.

## 2014-12-29 NOTE — Discharge Summary (Signed)
Physician Discharge Summary  YAHYE SIEBERT ZOX:[id number] DOB: 1940-11-23 DOA: 12/25/2014  PCP: [name], MD  Admit date: 12/25/2014 Discharge date: 12/29/2014  Time spent: 35 minutes  Recommendations for Outpatient Follow-up:    Discharge Diagnoses:  1 Sepsis secondary to  Catheter related  E.coli UTI 2 Hx of Renal cell Ca  3 Psoriasis 4 Acute on Chronic Renal failure    Discharge Condition: Stable    Filed Weights   12/27/14 0500 12/28/14 0700 12/29/14 0603  Weight: 73.12 kg (161 lb 3.2 oz) 72.258 kg (159 lb 4.8 oz) 74.98 kg (165 lb 4.8 oz)    History of present illness: Matthew Poole is a 74 year old male with a known history of mental retardation renal cell carcinoma chronic indwelling Foley catheter psoriasis dysphagia with PEG tube placement-admitted with UTI. Patient's Foley catheter had been blocked and urine culture grew Escherichia coli     Hospital Course:   Patient was admitted [facility] and received intravenous Levaquin subsequent cultures showed that the Escherichia coli was resistant to Cipro . He was therefore changed to IV Rocephin  Cultures from abdominal skin breakdown also showed evidence of heavy growth of staph aureus methicillin sensitive  Patient remained stable  his white cell count also improved and he was discharged back to his [place] in stable condition he has been advised to complete a course of Ceftin 250 mg twice a day for 10 days    Discharge Exam: Filed Vitals:   12/29/14 0807  BP: 136/53  Pulse: 69  Temp: 98.3 F (36.8 C)  Resp: 18    General: Not in distress Cardiovascular: S1 S2 Respiratory: Clear to auscultation  Discharge Instructions    Current Discharge Medication List    START taking these medications   Details  cefUROXime (CEFTIN) 250 MG/5ML suspension Place 5 mLs (250 mg total) into feeding tube 2 (two) times daily. Qty: 100 mL, Refills: 0    Nutritional Supplements  (FEEDING SUPPLEMENT, JEVITY 1.5 CAL/FIBER,) LIQD Place 237 mLs into feeding tube 3 (three) times daily between meals. Qty: 1000 mL, Refills: 5    Water For Irrigation, Sterile (FREE WATER) SOLN Place 200 mLs into feeding tube every 8 (eight) hours. Qty: 1000 mL, Refills: 3      CONTINUE these medications which have NOT CHANGED   Details  acetaminophen (TYLENOL) 325 MG tablet Take 325 mg by mouth every 4 (four) hours as needed for mild pain, moderate pain, fever or headache.    chlorhexidine (PERIDEX) 0.12 % solution Use as directed 0.5 mLs in the mouth or throat 2 (two) times daily.     cholecalciferol (VITAMIN D) 1000 UNITS tablet Take 1,000 Units by mouth daily.    clotrimazole (LOTRIMIN) 1 % cream Apply 1 application topically 2 (two) times daily.    docusate (COLACE) 50 MG/5ML liquid Take 50 mg by mouth 2 (two) times daily as needed for mild constipation.    doxazosin (CARDURA) 2 MG tablet Take 2 mg by mouth daily.    folic acid (FOLVITE) 1 MG tablet Take 5 mg by mouth See admin instructions. Takes everyday BUT Saturday    methotrexate (RHEUMATREX) 2.5 MG tablet Take 7.5 mg by mouth once a week. Patient takes on Saturday. Caution:Chemotherapy. Protect from light.    Multiple Vitamins-Minerals (MULTIVITAMIN WITH IRON-MINERALS) liquid Take 15 mLs by mouth daily.     nitrofurantoin, macrocrystal-monohydrate, (MACROBID) 100 MG capsule Take 100 mg by mouth daily.     omeprazole (PRILOSEC) 20 MG capsule  Take 20 mg by mouth 2 (two) times daily before a meal.     Probiotic Product (4X PROBIOTIC PO) Take 1 capsule by mouth at bedtime.    triamcinolone cream (KENALOG) 0.1 % Apply 1 application topically 2 (two) times daily.    vitamin C (ASCORBIC ACID) 250 MG tablet Take 500 mg by mouth daily.       Allergies  Allergen Reactions  . Penicillins Itching and Nausea And Vomiting  . Penicillin V Rash    unknown      The results of significant diagnostics from this hospitalization  (including imaging, microbiology, ancillary and laboratory) are listed below for reference.    Significant Diagnostic Studies: Dg Chest Port 1 View  12/25/2014   CLINICAL DATA:  Sepsis. High fever. Taking immunosuppressive medication.  EXAM: PORTABLE CHEST - 1 VIEW  COMPARISON:  None.  FINDINGS: The cardiac silhouette, mediastinal and hilar contours are within normal limits given the AP projection. Mild tortuosity of the thoracic aorta. The lungs are clear. No pleural effusion. Suspect a hiatal hernia. The bony thorax is intact.  IMPRESSION: No acute cardiopulmonary findings.  Probable hiatal hernia.   Electronically Signed   By: Marijo Sanes M.D.   On: 12/25/2014 10:21    Microbiology: Recent Results (from the past 240 hour(s))  Blood Culture (routine x 2)     Status: None (Preliminary result)   Collection Time: 12/25/14  9:00 AM  Result Value Ref Range Status   Specimen Description BLOOD RIGHT ARM  Final   Special Requests BOTTLES DRAWN AEROBIC AND ANAEROBIC  2CC  Final   Culture NO GROWTH 4 DAYS  Final   Report Status PENDING  Incomplete  Blood Culture (routine x 2)     Status: None (Preliminary result)   Collection Time: 12/25/14  9:01 AM  Result Value Ref Range Status   Specimen Description BLOOD LEFT ARM  Final   Special Requests   Final    BOTTLES DRAWN AEROBIC AND ANAEROBIC  AER 4CC ANA 1CC   Culture NO GROWTH 4 DAYS  Final   Report Status PENDING  Incomplete  Urine culture     Status: None   Collection Time: 12/25/14  9:01 AM  Result Value Ref Range Status   Specimen Description URINE, RANDOM  Final   Special Requests NONE  Final   Culture >=100,000 COLONIES/mL ESCHERICHIA COLI  Final   Report Status 12/27/2014 FINAL  Final   Organism ID, Bacteria ESCHERICHIA COLI  Final      Susceptibility   Escherichia coli - MIC*    AMPICILLIN <=2 SENSITIVE Sensitive     CEFTAZIDIME <=1 SENSITIVE Sensitive     CEFAZOLIN <=4 SENSITIVE Sensitive     CEFTRIAXONE <=1 SENSITIVE Sensitive      CIPROFLOXACIN >=4 RESISTANT Resistant     GENTAMICIN <=1 SENSITIVE Sensitive     IMIPENEM <=0.25 SENSITIVE Sensitive     TRIMETH/SULFA <=20 SENSITIVE Sensitive     NITROFURANTOIN Value in next row Resistant      RESISTANT128    PIP/TAZO Value in next row Sensitive      SENSITIVE<=4    * >=100,000 COLONIES/mL ESCHERICHIA COLI  Wound culture     Status: None (Preliminary result)   Collection Time: 12/25/14  9:30 AM  Result Value Ref Range Status   Specimen Description ABDOMEN  Final   Special Requests NONE  Final   Gram Stain   Final    FEW WBC SEEN FEW GRAM POSITIVE COCCI IN CLUSTERS FEW  GRAM NEGATIVE RODS    Culture   Final    MODERATE GROWTH ESCHERICHIA COLI HEAVY GROWTH STAPHYLOCOCCUS AUREUS TRYING TO ISOLATE OTHER POSSIBLE PATHOGENS    Report Status PENDING  Incomplete   Organism ID, Bacteria ESCHERICHIA COLI  Final   Organism ID, Bacteria STAPHYLOCOCCUS AUREUS  Final      Susceptibility   Staphylococcus aureus - MIC*    CIPROFLOXACIN <=0.5 SENSITIVE Sensitive     GENTAMICIN <=0.5 SENSITIVE Sensitive     OXACILLIN <=0.25 SENSITIVE Sensitive     TRIMETH/SULFA <=10 SENSITIVE Sensitive     CEFOXITIN SCREEN NEGATIVE Sensitive     Inducible Clindamycin NEGATIVE Sensitive     ERYTHROMYCIN Value in next row Sensitive      SENSITIVE0.5    TETRACYCLINE Value in next row Sensitive      SENSITIVE<=1    CLINDAMYCIN Value in next row Sensitive      SENSITIVE<=0.25    LINEZOLID Value in next row Sensitive      SENSITIVE2    * HEAVY GROWTH STAPHYLOCOCCUS AUREUS   Escherichia coli - MIC*    AMPICILLIN Value in next row Sensitive      SENSITIVE2    CEFAZOLIN Value in next row Sensitive      SENSITIVE2    CEFTRIAXONE Value in next row Sensitive      SENSITIVE2    CIPROFLOXACIN Value in next row Resistant      SENSITIVE2    GENTAMICIN Value in next row Sensitive      SENSITIVE2    IMIPENEM Value in next row Sensitive      SENSITIVE2    NITROFURANTOIN Value in next row  Sensitive      SENSITIVE2    TRIMETH/SULFA Value in next row Sensitive      SENSITIVE2    PIP/TAZO Value in next row Sensitive      SENSITIVE<=4    * MODERATE GROWTH ESCHERICHIA COLI     Labs: Basic Metabolic Panel:  Recent Labs Lab 12/25/14 0901 12/26/14 0553 12/27/14 0513 12/28/14 0421 12/29/14 0454  NA 140 142 140 139 137  K 4.1 3.9 3.6 4.0 4.1  CL 107 111 110 107 104  CO2 20* 23 24 24 25   GLUCOSE 177* 114* 102* 107* 111*  BUN 35* 24* 27* 31* 34*  CREATININE 2.42* 1.65* 1.37* 1.44* 1.52*  CALCIUM 9.1 8.4* 8.3* 8.6* 9.0   Liver Function Tests:  Recent Labs Lab 12/25/14 0901 12/27/14 0513 12/28/14 0421  AST 35 18 23  ALT 8* 9* 10*  ALKPHOS 103 76 75  BILITOT 0.7 0.7 0.4  PROT 6.9 5.7* 5.7*  ALBUMIN 3.5 2.8* 2.7*    Recent Labs Lab 12/25/14 0901  LIPASE 26   No results for input(s): AMMONIA in the last 168 hours. CBC:  Recent Labs Lab 12/25/14 0901 12/26/14 0553 12/27/14 0513 12/28/14 0421 12/29/14 0454  WBC 16.5* 12.1* 9.1 9.6 8.6  NEUTROABS 14.7*  --  6.3 6.7* 6.0  HGB 13.2 11.3* 11.8* 12.1* 12.1*  HCT 40.1 35.0* 35.2* 35.7* 36.4*  MCV 98.5 100.2* 99.3 99.2 98.1  PLT 192 160 164 184 196   Cardiac Enzymes:  Recent Labs Lab 12/25/14 0901  TROPONINI 0.06*   BNP: BNP (last 3 results) No results for input(s): BNP in the last 8760 hours.  ProBNP (last 3 results) No results for input(s): PROBNP in the last 8760 hours.  CBG: No results for input(s): GLUCAP in the last 168 hours.     SignedTracie Harrier  12/29/2014, 12:53 PM

## 2014-12-29 NOTE — Care Management Important Message (Signed)
Important Message  Patient Details  Name: Matthew Poole MRN: [id number] Date of Birth: 1941-01-09   Medicare Important Message Given:  Yes-second notification given    Juliann Pulse A Allmond 12/29/2014, 10:30 AM

## 2014-12-29 NOTE — Clinical Social Work Note (Signed)
Pt is ready for d/c today.  CSW informed pt, pt's guardia and staff at [facility].  Pt will be transported back to facility by his guardian.  Per facility request CSW faxed FL2 and prescriptions to [facility].  Information was provided to RN in order to call report.  CSW signing off as there are no further needs at this time.  [place], [place]

## 2014-12-30 LAB — CULTURE, BLOOD (ROUTINE X 2)
CULTURE: NO GROWTH
CULTURE: NO GROWTH

## 2015-02-14 ENCOUNTER — Emergency Department
Admission: EM | Admit: 2015-02-14 | Discharge: 2015-02-14 | Disposition: A | Discharge status: Home / Self Care | Payer: Commercial Managed Care - HMO | Attending: Emergency Medicine | Admitting: Emergency Medicine

## 2015-02-14 ENCOUNTER — Encounter: Payer: Self-pay | Admitting: Emergency Medicine

## 2015-02-14 ENCOUNTER — Emergency Department: Payer: Commercial Managed Care - HMO

## 2015-02-14 DIAGNOSIS — Z87891 Personal history of nicotine dependence: Secondary | ICD-10-CM | POA: Diagnosis not present

## 2015-02-14 DIAGNOSIS — Z431 Encounter for attention to gastrostomy: Secondary | ICD-10-CM | POA: Diagnosis not present

## 2015-02-14 DIAGNOSIS — Z4659 Encounter for fitting and adjustment of other gastrointestinal appliance and device: Secondary | ICD-10-CM

## 2015-02-14 DIAGNOSIS — Z880 Allergy status to penicillin: Secondary | ICD-10-CM | POA: Insufficient documentation

## 2015-02-14 DIAGNOSIS — Z79899 Other long term (current) drug therapy: Secondary | ICD-10-CM | POA: Insufficient documentation

## 2015-02-14 MED ORDER — IOHEXOL 300 MG/ML  SOLN
100.0000 mL | Freq: Once | INTRAMUSCULAR | Status: DC | PRN
  Administered 2015-02-14: 100 mL
  Filled 2015-02-14: qty 100

## 2015-02-14 NOTE — ED Notes (Signed)
This RN called [facility] and was told that pt could be placed in a cab and facility would pat for fair. Pt agrees with this plan and verbalizes understanding. Ladona Mow called for transportation.

## 2015-02-14 NOTE — ED Notes (Addendum)
Pt to ED via EMS from [facility] home. Per EMS report pt pulled his PEG tube out this morning. Pt states he doesn't know what happened to it. Tube is 18Fr/34mL, balloon intact. Noted small amount dark red blood around site, pt denies pain. Pt alert, oriented to person and place only. Able to answer questions.

## 2015-02-14 NOTE — ED Provider Notes (Signed)
[facility] Emergency Department Provider Note  Time seen: 9:28 AM  I have reviewed the triage vital signs and the nursing notes.   HISTORY  Chief Complaint No chief complaint on file.    HPI Matthew Poole is a 74 y.o. male with a past medical history of weakness, dysphasia, cancer, mental retardation, lives at a family care home, presents to the emergency department after his G-tube was accidentally pulled out.Per EMS report the group home doesn't know when it happened either last night or this morning. Patient is alert and oriented to person and place only. Does not know why he is here, does not know when his G-tube was placed, denies any pain.      Past Medical History  Diagnosis Date  . Difficulty walking   . Muscle weakness   . Dysphagia, oropharyngeal   . Reflux   . Cancer (HCC)     renal cell carcinoma  . Mental retardation   . Psoriasis     Patient Active Problem List   Diagnosis Date Noted  . Sepsis secondary to UTI ([place]) 12/25/2014  . Sepsis ([place]) 12/25/2014    Past Surgical History  Procedure Laterality Date  . Peg placement      Current Outpatient Rx  Name  Route  Sig  Dispense  Refill  . acetaminophen (TYLENOL) 325 MG tablet   Oral   Take 325 mg by mouth every 4 (four) hours as needed for mild pain, moderate pain, fever or headache.         . chlorhexidine (PERIDEX) 0.12 % solution   Mouth/Throat   Use as directed 0.5 mLs in the mouth or throat 2 (two) times daily.          . cholecalciferol (VITAMIN D) 1000 UNITS tablet   Oral   Take 1,000 Units by mouth daily.         . clotrimazole (LOTRIMIN) 1 % cream   Topical   Apply 1 application topically 2 (two) times daily.         Marland Kitchen docusate (COLACE) 50 MG/5ML liquid   Oral   Take 50 mg by mouth 2 (two) times daily as needed for mild constipation.         Marland Kitchen doxazosin (CARDURA) 2 MG tablet   Oral   Take 2 mg by mouth daily.         . folic acid  (FOLVITE) 1 MG tablet   Oral   Take 5 mg by mouth See admin instructions. Takes everyday BUT Saturday         . methotrexate (RHEUMATREX) 2.5 MG tablet   Oral   Take 7.5 mg by mouth once a week. Patient takes on Saturday. Caution:Chemotherapy. Protect from light.         . Multiple Vitamins-Minerals (MULTIVITAMIN WITH IRON-MINERALS) liquid   Oral   Take 15 mLs by mouth daily.          . nitrofurantoin, macrocrystal-monohydrate, (MACROBID) 100 MG capsule   Oral   Take 100 mg by mouth daily.          . Nutritional Supplements (FEEDING SUPPLEMENT, JEVITY 1.5 CAL/FIBER,) LIQD   Per Tube   Place 237 mLs into feeding tube 3 (three) times daily between meals.   1000 mL   5   . omeprazole (PRILOSEC) 20 MG capsule   Oral   Take 20 mg by mouth 2 (two) times daily before a meal.          .  Probiotic Product (4X PROBIOTIC PO)   Oral   Take 1 capsule by mouth at bedtime.         . triamcinolone cream (KENALOG) 0.1 %   Topical   Apply 1 application topically 2 (two) times daily.         . vitamin C (ASCORBIC ACID) 250 MG tablet   Oral   Take 500 mg by mouth daily.         . Water For Irrigation, Sterile (FREE WATER) SOLN   Per Tube   Place 200 mLs into feeding tube every 8 (eight) hours.   1000 mL   3     Allergies Penicillins and Penicillin v  Family History  Problem Relation Age of Onset  . Family history unknown: Yes    Social History Social History  Substance Use Topics  . Smoking status: Former Research scientist (life sciences)  . Smokeless tobacco: None  . Alcohol Use: No    Review of Systems Constitutional: Negative for fever. Cardiovascular: Negative for chest pain. Respiratory: Negative for shortness of breath. Gastrointestinal: Negative for abdominal pain, nausea or vomiting. Neurological: Negative for headache 10-point ROS otherwise negative.  ____________________________________________   PHYSICAL EXAM:  VITAL SIGNS: ED Triage Vitals  Enc Vitals Group      BP 02/14/15 0917 130/67 mmHg     Pulse Rate 02/14/15 0917 91     Resp 02/14/15 0917 13     Temp 02/14/15 0917 98.2 F (36.8 C)     Temp Source 02/14/15 0917 Oral     SpO2 02/14/15 0917 98 %     Weight 02/14/15 0917 156 lb 11.9 oz (71.098 kg)     Height 02/14/15 0917 _0  (1.778 m)     Head Cir --      Peak Flow --      Pain Score --      Pain Loc --      Pain Edu? --      Excl. in [place]? --     Constitutional: Alert, awake, well appearing. No distress. Eyes: Normal exam ENT   Head: Normocephalic and atraumatic   Mouth/Throat: Mucous membranes are moist. Cardiovascular: Normal rate, regular rhythm. N Respiratory: Normal respiratory effort without tachypnea nor retractions. Breath sounds are clear  Gastrointestinal: Soft, nontender, G-tube insertion site present, small amount of dark blood noted around insertion site. Nontender to palpation. Musculoskeletal: Nontender with normal range of motion in all extremities.  Neurologic:  Normal speech and language.  Skin:  Skin is warm, dry and intact.  Psychiatric: Mood and affect are normal.  ____________________________________________     RADIOLOGY  Appropriately placed G-tube.  ____________________________________________    INITIAL IMPRESSION / ASSESSMENT AND PLAN / ED COURSE  Pertinent labs & imaging results that were available during my care of the patient were reviewed by me and considered in my medical decision making (see chart for details).  Patient presents after accidental G-tube dislodgment. We will replace G-tube in the emergency department. No complaints at this time. Nontender abdominal exam.   G-tube replaced with no difficulty. No resistance met with 6 [place] G-tube insertion. Inflated balloon to 5 cc of normal saline. Able to aspirate stomach contents, unable to auscultate bubbling within the stomach. We will obtain a Gastrografin study to ensure proper placement and discharge the patient home.  Patient tolerated procedure very well, no pain, continues to be pain-free. ____________________________________________   FINAL CLINICAL IMPRESSION(S) / ED DIAGNOSES  G-tube replacement   Harvest Dark, MD 02/14/15 1138

## 2015-03-11 ENCOUNTER — Ambulatory Visit (INDEPENDENT_AMBULATORY_CARE_PROVIDER_SITE_OTHER): Payer: Commercial Managed Care - HMO | Admitting: Podiatry

## 2015-03-11 DIAGNOSIS — B351 Tinea unguium: Secondary | ICD-10-CM

## 2015-03-11 DIAGNOSIS — M79676 Pain in unspecified toe(s): Secondary | ICD-10-CM

## 2015-03-11 NOTE — Progress Notes (Signed)
Patient ID: Matthew Poole, male   DOB: 11/08/1940, 74 y.o.   MRN: [id number]  Subjective: 74 y.o.-year-old male returns the office today for painful, elongated, thickened toenails which he is unable to trim himself. Denies any redness or drainage around the nails. Denies any acute changes since last appointment and no new complaints today. Denies any systemic complaints such as fevers, chills, nausea, vomiting.   Objective: AAO 3, NAD DP/PT pulses palpable, CRT less than 3 seconds Protective sensation intact with Simms Weinstein monofilament, Achilles tendon reflex intact.  Nails hypertrophic, dystrophic, elongated, brittle, discolored 10. There is tenderness overlying the nails 1-5 bilaterally. There is no surrounding erythema or drainage along the nail sites. On the dorsal aspect of the foot/anterior ankle there are small, annular, dry, ceramic patches of skin. His caregiver states that he is on a dermatologist monitors his skin issues and he is on medication for this.  No open lesions or pre-ulcerative lesions are identified bilaterally No other areas of apparent tenderness bilateral lower extremities. No overlying edema, erythema, increased warmth. No pain with calf compression, swelling, warmth, erythema.  Assessment: Patient presents with symptomatic onychomycosis  Plan: -Treatment options including alternatives, risks, complications were discussed -Nails sharply debrided 10 without complication/bleeding. -continued followup with dermatology. -Discussed daily foot inspection. If there are any changes, to call the office immediately.  -Follow-up in 3 months or sooner if any problems are to arise. In the meantime, encouraged to call the office with any questions, concerns, changes symptoms. 3 mo.  Celesta Gentile, DPM

## 2015-04-05 ENCOUNTER — Encounter: Payer: Self-pay | Admitting: Emergency Medicine

## 2015-04-05 ENCOUNTER — Emergency Department: Payer: Commercial Managed Care - HMO

## 2015-04-05 ENCOUNTER — Inpatient Hospital Stay
Admission: EM | Admit: 2015-04-05 | Discharge: 2015-04-07 | DRG: 871 | Disposition: A | Discharge status: Skilled Nursing Facility | Payer: Commercial Managed Care - HMO | Attending: Internal Medicine | Admitting: Internal Medicine

## 2015-04-05 DIAGNOSIS — Z79899 Other long term (current) drug therapy: Secondary | ICD-10-CM

## 2015-04-05 DIAGNOSIS — N19 Unspecified kidney failure: Secondary | ICD-10-CM | POA: Diagnosis present

## 2015-04-05 DIAGNOSIS — E440 Moderate protein-calorie malnutrition: Secondary | ICD-10-CM | POA: Diagnosis present

## 2015-04-05 DIAGNOSIS — N183 Chronic kidney disease, stage 3 (moderate): Secondary | ICD-10-CM | POA: Diagnosis present

## 2015-04-05 DIAGNOSIS — L409 Psoriasis, unspecified: Secondary | ICD-10-CM | POA: Diagnosis present

## 2015-04-05 DIAGNOSIS — R197 Diarrhea, unspecified: Secondary | ICD-10-CM | POA: Diagnosis present

## 2015-04-05 DIAGNOSIS — Z880 Allergy status to penicillin: Secondary | ICD-10-CM

## 2015-04-05 DIAGNOSIS — K219 Gastro-esophageal reflux disease without esophagitis: Secondary | ICD-10-CM | POA: Diagnosis present

## 2015-04-05 DIAGNOSIS — A419 Sepsis, unspecified organism: Secondary | ICD-10-CM | POA: Diagnosis not present

## 2015-04-05 DIAGNOSIS — E870 Hyperosmolality and hypernatremia: Secondary | ICD-10-CM | POA: Diagnosis present

## 2015-04-05 DIAGNOSIS — N179 Acute kidney failure, unspecified: Secondary | ICD-10-CM | POA: Diagnosis present

## 2015-04-05 DIAGNOSIS — E860 Dehydration: Secondary | ICD-10-CM | POA: Diagnosis present

## 2015-04-05 DIAGNOSIS — N170 Acute kidney failure with tubular necrosis: Secondary | ICD-10-CM | POA: Diagnosis present

## 2015-04-05 DIAGNOSIS — Z85528 Personal history of other malignant neoplasm of kidney: Secondary | ICD-10-CM

## 2015-04-05 DIAGNOSIS — Z6820 Body mass index (BMI) 20.0-20.9, adult: Secondary | ICD-10-CM | POA: Diagnosis not present

## 2015-04-05 DIAGNOSIS — Z931 Gastrostomy status: Secondary | ICD-10-CM

## 2015-04-05 DIAGNOSIS — N390 Urinary tract infection, site not specified: Secondary | ICD-10-CM | POA: Diagnosis present

## 2015-04-05 DIAGNOSIS — G9341 Metabolic encephalopathy: Secondary | ICD-10-CM | POA: Diagnosis present

## 2015-04-05 DIAGNOSIS — N400 Enlarged prostate without lower urinary tract symptoms: Secondary | ICD-10-CM | POA: Diagnosis present

## 2015-04-05 DIAGNOSIS — F79 Unspecified intellectual disabilities: Secondary | ICD-10-CM | POA: Diagnosis present

## 2015-04-05 DIAGNOSIS — R1310 Dysphagia, unspecified: Secondary | ICD-10-CM | POA: Diagnosis present

## 2015-04-05 DIAGNOSIS — E872 Acidosis: Secondary | ICD-10-CM | POA: Diagnosis present

## 2015-04-05 DIAGNOSIS — R6520 Severe sepsis without septic shock: Secondary | ICD-10-CM | POA: Diagnosis present

## 2015-04-05 LAB — CBC
HCT: 42.1 % (ref 40.0–52.0)
Hemoglobin: 13.9 g/dL (ref 13.0–18.0)
MCH: 31.9 pg (ref 26.0–34.0)
MCHC: 32.9 g/dL (ref 32.0–36.0)
MCV: 96.9 fL (ref 80.0–100.0)
PLATELETS: 179 10*3/uL (ref 150–440)
RBC: 4.35 MIL/uL — ABNORMAL LOW (ref 4.40–5.90)
RDW: 13.4 % (ref 11.5–14.5)
WBC: 12.3 10*3/uL — AB (ref 3.8–10.6)

## 2015-04-05 LAB — URINALYSIS COMPLETE WITH MICROSCOPIC (ARMC ONLY)
Bilirubin Urine: NEGATIVE
GLUCOSE, UA: 50 mg/dL — AB
NITRITE: NEGATIVE
Protein, ur: 100 mg/dL — AB
SPECIFIC GRAVITY, URINE: 1.017 (ref 1.005–1.030)
pH: 5 (ref 5.0–8.0)

## 2015-04-05 LAB — COMPREHENSIVE METABOLIC PANEL
ALBUMIN: 3.9 g/dL (ref 3.5–5.0)
ALT: 11 U/L — ABNORMAL LOW (ref 17–63)
ANION GAP: 11 (ref 5–15)
AST: 21 U/L (ref 15–41)
Alkaline Phosphatase: 98 U/L (ref 38–126)
BUN: 53 mg/dL — AB (ref 6–20)
CHLORIDE: 118 mmol/L — AB (ref 101–111)
CO2: 18 mmol/L — ABNORMAL LOW (ref 22–32)
Calcium: 9.5 mg/dL (ref 8.9–10.3)
Creatinine, Ser: 1.82 mg/dL — ABNORMAL HIGH (ref 0.61–1.24)
GFR calc Af Amer: 41 mL/min — ABNORMAL LOW (ref >60)
GFR calc non Af Amer: 35 mL/min — ABNORMAL LOW (ref >60)
GLUCOSE: 123 mg/dL — AB (ref 65–99)
POTASSIUM: 3.6 mmol/L (ref 3.5–5.1)
Sodium: 147 mmol/L — ABNORMAL HIGH (ref 135–145)
Total Bilirubin: 0.5 mg/dL (ref 0.3–1.2)
Total Protein: 7.4 g/dL (ref 6.5–8.1)

## 2015-04-05 LAB — LIPASE, BLOOD: LIPASE: 136 U/L — AB (ref 11–51)

## 2015-04-05 LAB — MAGNESIUM: Magnesium: 1.9 mg/dL (ref 1.7–2.4)

## 2015-04-05 LAB — LACTIC ACID, PLASMA
LACTIC ACID, VENOUS: 3.1 mmol/L — AB (ref 0.5–2.0)
Lactic Acid, Venous: 2.1 mmol/L (ref 0.5–2.0)

## 2015-04-05 LAB — TROPONIN I: Troponin I: <0.03 ng/mL (ref <0.031)

## 2015-04-05 MED ORDER — DEXTROSE 5 % IV SOLN
2.0000 g | Freq: Three times a day (TID) | INTRAVENOUS | Status: DC
  Administered 2015-04-06 (×2): 2 g via INTRAVENOUS
  Filled 2015-04-05 (×4): qty 2

## 2015-04-05 MED ORDER — SODIUM CHLORIDE 0.45 % IV SOLN
INTRAVENOUS | Status: DC
  Administered 2015-04-05: 1000 mL via INTRAVENOUS
  Administered 2015-04-06: 07:00:00 via INTRAVENOUS
  Administered 2015-04-06: 1000 mL via INTRAVENOUS
  Administered 2015-04-07: 02:00:00 via INTRAVENOUS

## 2015-04-05 MED ORDER — SODIUM CHLORIDE 0.9 % IV SOLN
1000.0000 mL | Freq: Once | INTRAVENOUS | Status: AC
  Administered 2015-04-05: 1000 mL via INTRAVENOUS

## 2015-04-05 MED ORDER — SODIUM CHLORIDE 0.9 % IV BOLUS (SEPSIS)
1000.0000 mL | INTRAVENOUS | Status: AC
  Administered 2015-04-05: 1000 mL via INTRAVENOUS

## 2015-04-05 MED ORDER — DEXTROSE 5 % IV SOLN
2.0000 g | Freq: Once | INTRAVENOUS | Status: AC
  Administered 2015-04-05: 2 g via INTRAVENOUS
  Filled 2015-04-05: qty 2

## 2015-04-05 MED ORDER — PANTOPRAZOLE SODIUM 40 MG PO TBEC
40.0000 mg | DELAYED_RELEASE_TABLET | Freq: Every day | ORAL | Status: DC
Start: 2015-04-06 — End: 2015-04-07
  Administered 2015-04-06 – 2015-04-07 (×2): 40 mg via ORAL
  Filled 2015-04-05 (×2): qty 1

## 2015-04-05 MED ORDER — FOLIC ACID 1 MG PO TABS
5.0000 mg | ORAL_TABLET | ORAL | Status: DC
  Administered 2015-04-05 – 2015-04-07 (×3): 5 mg via ORAL
  Filled 2015-04-05 (×4): qty 5

## 2015-04-05 MED ORDER — ACETAMINOPHEN 650 MG RE SUPP: 650.0000 mg | Freq: Four times a day (QID) | RECTAL | Status: DC | PRN

## 2015-04-05 MED ORDER — ONDANSETRON HCL 4 MG/2ML IJ SOLN: 4.0000 mg | Freq: Four times a day (QID) | INTRAMUSCULAR | Status: DC | PRN

## 2015-04-05 MED ORDER — SODIUM CHLORIDE 0.9 % IV BOLUS (SEPSIS)
500.0000 mL | INTRAVENOUS | Status: AC
  Administered 2015-04-05: 500 mL via INTRAVENOUS

## 2015-04-05 MED ORDER — ONDANSETRON HCL 4 MG PO TABS: 4.0000 mg | ORAL_TABLET | Freq: Four times a day (QID) | ORAL | Status: DC | PRN

## 2015-04-05 MED ORDER — DOCUSATE SODIUM 100 MG PO CAPS: 100.0000 mg | ORAL_CAPSULE | Freq: Two times a day (BID) | ORAL | Status: DC | PRN

## 2015-04-05 MED ORDER — CHLORHEXIDINE GLUCONATE 0.12 % MT SOLN
0.5000 mL | Freq: Two times a day (BID) | OROMUCOSAL | Status: DC
  Administered 2015-04-06 (×2): 0.5 mL via OROMUCOSAL
  Filled 2015-04-05 (×2): qty 15

## 2015-04-05 MED ORDER — ALBUTEROL SULFATE (2.5 MG/3ML) 0.083% IN NEBU: 2.5000 mg | INHALATION_SOLUTION | RESPIRATORY_TRACT | Status: DC | PRN

## 2015-04-05 MED ORDER — VANCOMYCIN HCL IN DEXTROSE 1-5 GM/200ML-% IV SOLN
1000.0000 mg | Freq: Once | INTRAVENOUS | Status: AC
  Administered 2015-04-05: 1000 mg via INTRAVENOUS
  Filled 2015-04-05: qty 200

## 2015-04-05 MED ORDER — ACETAMINOPHEN 325 MG PO TABS: 650.0000 mg | ORAL_TABLET | Freq: Four times a day (QID) | ORAL | Status: DC | PRN

## 2015-04-05 MED ORDER — LEVOFLOXACIN IN D5W 750 MG/150ML IV SOLN
750.0000 mg | Freq: Once | INTRAVENOUS | Status: AC
  Administered 2015-04-05: 750 mg via INTRAVENOUS
  Filled 2015-04-05: qty 150

## 2015-04-05 MED ORDER — RISAQUAD PO CAPS
1.0000 | ORAL_CAPSULE | Freq: Every day | ORAL | Status: DC
  Administered 2015-04-06 (×2): 1 via ORAL
  Filled 2015-04-05 (×2): qty 1

## 2015-04-05 MED ORDER — HEPARIN SODIUM (PORCINE) 5000 UNIT/ML IJ SOLN
5000.0000 [IU] | Freq: Three times a day (TID) | INTRAMUSCULAR | Status: DC
  Administered 2015-04-05 – 2015-04-07 (×6): 5000 [IU] via SUBCUTANEOUS
  Filled 2015-04-05 (×6): qty 1

## 2015-04-05 MED ORDER — FREE WATER
200.0000 mL | Freq: Three times a day (TID) | Status: DC
  Administered 2015-04-05 – 2015-04-06 (×3): 200 mL

## 2015-04-05 MED ORDER — DOXAZOSIN MESYLATE 4 MG PO TABS
2.0000 mg | ORAL_TABLET | Freq: Every day | ORAL | Status: DC
  Administered 2015-04-05 – 2015-04-06 (×2): 2 mg via ORAL
  Filled 2015-04-05 (×3): qty 1

## 2015-04-05 MED ORDER — VANCOMYCIN HCL IN DEXTROSE 1-5 GM/200ML-% IV SOLN
1000.0000 mg | INTRAVENOUS | Status: DC
  Administered 2015-04-06: 1000 mg via INTRAVENOUS
  Filled 2015-04-05 (×3): qty 200

## 2015-04-05 NOTE — ED Provider Notes (Signed)
[facility] Emergency Department Provider Note  ____________________________________________  Time seen: On arrival, via EMS  I have reviewed the triage vital signs and the nursing notes.   HISTORY  Chief Complaint Diarrhea  Patient with history of mental retardation  HPI Matthew Poole is a 74 y.o. male who presents from a group home essentially covered in feces. EMS was called for rapid heart rate and diarrhea. He also has a chronic indwelling Foley that our nurse removed upon arrival because it was "disgusting". No fever noted although there was a report of hypothermia although his temperature here is normal. Patient has no acute complaints. He does report a mild cough     Past Medical History  Diagnosis Date  . Difficulty walking   . Muscle weakness   . Dysphagia, oropharyngeal   . Reflux   . Cancer (HCC)     renal cell carcinoma  . Mental retardation   . Psoriasis     Patient Active Problem List   Diagnosis Date Noted  . Sepsis secondary to UTI ([place]) 12/25/2014  . Sepsis ([place]) 12/25/2014    Past Surgical History  Procedure Laterality Date  . Peg placement      Current Outpatient Rx  Name  Route  Sig  Dispense  Refill  . acetaminophen (TYLENOL) 325 MG tablet   Oral   Take 325 mg by mouth every 4 (four) hours as needed for mild pain, moderate pain, fever or headache.         . chlorhexidine (PERIDEX) 0.12 % solution   Mouth/Throat   Use as directed 0.5 mLs in the mouth or throat 2 (two) times daily.          . cholecalciferol (VITAMIN D) 1000 UNITS tablet   Oral   Take 1,000 Units by mouth daily.         . clotrimazole (LOTRIMIN) 1 % cream   Topical   Apply 1 application topically 2 (two) times daily.         Marland Kitchen docusate (COLACE) 50 MG/5ML liquid   Oral   Take 50 mg by mouth 2 (two) times daily as needed for mild constipation.         Marland Kitchen doxazosin (CARDURA) 2 MG tablet   Oral   Take 2 mg by mouth daily.          . folic acid (FOLVITE) 1 MG tablet   Oral   Take 5 mg by mouth See admin instructions. Takes everyday BUT Saturday         . methotrexate (RHEUMATREX) 2.5 MG tablet   Oral   Take 7.5 mg by mouth once a week. Patient takes on Saturday. Caution:Chemotherapy. Protect from light.         . Multiple Vitamins-Minerals (MULTIVITAMIN WITH IRON-MINERALS) liquid   Oral   Take 15 mLs by mouth daily.          . nitrofurantoin, macrocrystal-monohydrate, (MACROBID) 100 MG capsule   Oral   Take 100 mg by mouth daily.          . Nutritional Supplements (FEEDING SUPPLEMENT, JEVITY 1.5 CAL/FIBER,) LIQD   Per Tube   Place 237 mLs into feeding tube 3 (three) times daily between meals.   1000 mL   5   . omeprazole (PRILOSEC) 20 MG capsule   Oral   Take 20 mg by mouth 2 (two) times daily before a meal.          . Probiotic Product (4X  PROBIOTIC PO)   Oral   Take 1 capsule by mouth at bedtime.         . triamcinolone cream (KENALOG) 0.1 %   Topical   Apply 1 application topically 2 (two) times daily.         . vitamin C (ASCORBIC ACID) 250 MG tablet   Oral   Take 500 mg by mouth daily.         . Water For Irrigation, Sterile (FREE WATER) SOLN   Per Tube   Place 200 mLs into feeding tube every 8 (eight) hours.   1000 mL   3     Allergies Penicillins and Penicillin v  Family History  Problem Relation Age of Onset  . Family history unknown: Yes    Social History Social History  Substance Use Topics  . Smoking status: Former Research scientist (life sciences)  . Smokeless tobacco: None  . Alcohol Use: No    Review of Systems  Constitutional: Negative for fever. Eyes: Negative for visual changes. ENT: Negative for sore throat Cardiovascular: Negative for chest pain. Respiratory: Negative for shortness of breath. Positive for cough Gastrointestinal: Positive for diarrhea Genitourinary: Negative for dysuria. Musculoskeletal: Negative for back pain. Skin: Negative for  rash. Neurological: Negative for headaches or focal weakness Psychiatric: No anxiety    ____________________________________________   PHYSICAL EXAM:  VITAL SIGNS: ED Triage Vitals  Enc Vitals Group     BP --      Pulse Rate 04/05/15 1114 111     Resp --      Temp 04/05/15 1114 97.7 F (36.5 C)     Temp Source 04/05/15 1114 Oral     SpO2 04/05/15 1114 100 %     Weight 04/05/15 1114 150 lb (68.04 kg)     Height 04/05/15 1114 5\' 11"  (1.803 m)     Head Cir --      Peak Flow --      Pain Score --      Pain Loc --      Pain Edu? --      Excl. in [place]? --      Constitutional: Alert and oriented. No acute distress Eyes: Conjunctivae are normal.  ENT   Head: Normocephalic and atraumatic.   Mouth/Throat: Mucous membranes are moist. Cardiovascular: Tachycardia, regular rhythm. Normal and symmetric distal pulses are present in all extremities. No murmurs, rubs, or gallops. Respiratory: Normal respiratory effort without tachypnea nor retractions. Breath sounds are clear and equal bilaterally.  Gastrointestinal: Soft and non-tender in all quadrants. No distention. There is no CVA tenderness. Genitourinary: Mild erosion of urethra, thick yellow discharge Musculoskeletal: Nontender with normal range of motion in all extremities. No lower extremity tenderness nor edema. Neurologic:  Normal speech and language. No gross focal neurologic deficits are appreciated. Skin:  Skin is warm, dry and intact. No rash noted. Psychiatric: Mood and affect are normal.   ____________________________________________    LABS (pertinent positives/negatives)  Labs Reviewed  CBC - Abnormal; Notable for the following:    WBC 12.3 (*)    RBC 4.35 (*)    All other components within normal limits  LACTIC ACID, PLASMA - Abnormal; Notable for the following:    Lactic Acid, Venous 3.1 (*)    All other components within normal limits  URINE CULTURE  CULTURE, BLOOD (ROUTINE X 2)  CULTURE, BLOOD  (ROUTINE X 2)  COMPREHENSIVE METABOLIC PANEL  LIPASE, BLOOD  TROPONIN I  URINALYSIS COMPLETEWITH MICROSCOPIC ([facility] ONLY)  LACTIC ACID, PLASMA  ____________________________________________   EKG  ED ECG REPORT I, Lavonia Drafts, the attending physician, personally viewed and interpreted this ECG.   Date: 04/05/2015  EKG Time: 109  Rate: 99  Rhythm: sinus tachycardia  Axis: Normal  Intervals:right bundle branch block  ST&T Change: Nonspecific   ____________________________________________    RADIOLOGY I have personally reviewed any xrays that were ordered on this patient: Chest x-ray unremarkable  ____________________________________________   PROCEDURES  Procedure(s) performed: none  Critical Care performed: none  ____________________________________________   INITIAL IMPRESSION / ASSESSMENT AND PLAN / ED COURSE  Pertinent labs & imaging results that were available during my care of the patient were reviewed by me and considered in my medical decision making (see chart for details).  Patient presents with tachycardia, lactic acid is elevated, white blood cell count is elevated, given indwelling Foley I'm suspicious of urinary tract infection. Given penicillin allergy broad-spectrum antibiotics started and IV fluids started  We will admit the patient for further treatment by medicine  ____________________________________________   FINAL CLINICAL IMPRESSION(S) / ED DIAGNOSES  Final diagnoses:  Acute hypernatremia  Sepsis secondary to UTI [facility])     Lavonia Drafts, MD 04/05/15 1339

## 2015-04-05 NOTE — H&P (Addendum)
[facility] [place] NAME: Matthew Poole    MR#:  [id number]  DATE OF BIRTH:  [birth year]  DATE OF ADMISSION:  04/05/2015  PRIMARY CARE PHYSICIAN: [name], MD   REQUESTING/REFERRING PHYSICIAN: Lavonia Drafts, MD  CHIEF COMPLAINT:   Chief Complaint  Patient presents with  . Diarrhea   diarrhea.  HISTORY OF PRESENT ILLNESS:  Matthew Poole  is a 74 y.o. male with a known history of renal cancer, mental retardation and dysphagia. The patient was sent from home essentially covered in feces. EMS was called for rapid heart rate and diarrhea. The patient has a chronic indwelling Foley that the ED nurse removed ON arrival because it was "disgusting". The patient was found tachycardia, leukocytosis and UTI. He was treated with antibiotics in ED.  PAST MEDICAL HISTORY:   Past Medical History  Diagnosis Date  . Difficulty walking   . Muscle weakness   . Dysphagia, oropharyngeal   . Reflux   . Cancer (HCC)     renal cell carcinoma  . Mental retardation   . Psoriasis     PAST SURGICAL HISTORY:   Past Surgical History  Procedure Laterality Date  . Peg placement      SOCIAL HISTORY:   Social History  Substance Use Topics  . Smoking status: Former Research scientist (life sciences)  . Smokeless tobacco: Not on file  . Alcohol Use: No    FAMILY HISTORY:   Family History  Problem Relation Age of Onset  . Family history unknown: Yes   unable to obtain.  DRUG ALLERGIES:   Allergies  Allergen Reactions  . Penicillins Itching, Nausea And Vomiting, Rash and Other (See Comments)    Unable to obtain enough information to answer additional questions about this medication.      REVIEW OF SYSTEMS:  Confused, unable to obtain.   MEDICATIONS AT HOME:   Prior to Admission medications   Medication Sig Start Date End Date Taking? Authorizing Provider  acetaminophen (TYLENOL) 325 MG tablet Take 325 mg by mouth every 4 (four) hours as needed  for mild pain, fever or headache.    Yes Historical Provider, MD  chlorhexidine (PERIDEX) 0.12 % solution Use as directed 0.5 mLs in the mouth or throat 2 (two) times daily.    Yes Historical Provider, MD  cholecalciferol (VITAMIN D) 1000 UNITS tablet Take 1,000 Units by mouth daily.   Yes Historical Provider, MD  clotrimazole (LOTRIMIN) 1 % cream Apply 1 application topically 2 (two) times daily.   Yes Historical Provider, MD  docusate (COLACE) 50 MG/5ML liquid Take 50 mg by mouth 2 (two) times daily as needed for mild constipation.   Yes Historical Provider, MD  doxazosin (CARDURA) 2 MG tablet Take 2 mg by mouth daily.   Yes Historical Provider, MD  doxycycline (VIBRA-TABS) 100 MG tablet Take 100 mg by mouth 2 (two) times daily. 04/01/15 04/08/15 Yes Historical Provider, MD  folic acid (FOLVITE) 1 MG tablet Take 5 mg by mouth daily. Pt takes Monday-Saturday.   Yes Historical Provider, MD  methotrexate (RHEUMATREX) 2.5 MG tablet Take 5 mg by mouth once a week. Patient takes on Sunday.    Caution:Chemotherapy. Protect from light.   Yes Historical Provider, MD  Multiple Vitamins-Minerals (MULTIVITAMIN WITH IRON-MINERALS) liquid Take 15 mLs by mouth daily.    Yes Historical Provider, MD  nitrofurantoin, macrocrystal-monohydrate, (MACROBID) 100 MG capsule Take 100 mg by mouth daily.    Yes Historical Provider, MD  Nutritional Supplements (FEEDING SUPPLEMENT,  JEVITY 1.5 CAL,) LIQD Place 237 mLs into feeding tube 4 (four) times daily.   Yes Historical Provider, MD  omeprazole (PRILOSEC) 20 MG capsule Take 20 mg by mouth 2 (two) times daily.    Yes Historical Provider, MD  Probiotic Product (4X PROBIOTIC PO) Take 1 capsule by mouth daily.    Yes Historical Provider, MD  triamcinolone cream (KENALOG) 0.1 % Apply 1 application topically 2 (two) times daily.   Yes Historical Provider, MD  vitamin C (ASCORBIC ACID) 250 MG tablet Take 500 mg by mouth 2 (two) times daily.    Yes Historical Provider, MD       VITAL SIGNS:  Blood pressure 129/66, pulse 83, temperature 97.7 F (36.5 C), temperature source Oral, resp. rate 21, height 5\' 11"  (1.803 m), weight 68.04 kg (150 lb), SpO2 98 %.  PHYSICAL EXAMINATION:  GENERAL:  74 y.o.-year-old patient lying in the bed with no acute distress.  EYES: Pupils equal, round, reactive to light and accommodation. No scleral icterus. Extraocular muscles intact.  HEENT: Head atraumatic, normocephalic. Oropharynx and nasopharynx clear. Dry oral mucosa. NECK:  Supple, no jugular venous distention. No thyroid enlargement, no tenderness.  LUNGS: Normal breath sounds bilaterally, no wheezing, rales,rhonchi or crepitation. No use of accessory muscles of respiration.  CARDIOVASCULAR: S1, S2 normal. No murmurs, rubs, or gallops.  ABDOMEN: Soft, nontender, nondistended. Bowel sounds present. No organomegaly or mass.  EXTREMITIES: No pedal edema, cyanosis, or clubbing.  NEUROLOGIC: Cranial nerves II through XII are intact. Muscle strength 5/5 in all extremities. Sensation intact. Gait not checked.  PSYCHIATRIC: The patient is confused. SKIN: No obvious rash, lesion, or ulcer.   LABORATORY PANEL:   CBC  Recent Labs Lab 04/05/15 1120  WBC 12.3*  HGB 13.9  HCT 42.1  PLT 179   ------------------------------------------------------------------------------------------------------------------  Chemistries   Recent Labs Lab 04/05/15 1120  NA 147*  K 3.6  CL 118*  CO2 18*  GLUCOSE 123*  BUN 53*  CREATININE 1.82*  CALCIUM 9.5  AST 21  ALT 11*  ALKPHOS 98  BILITOT 0.5   ------------------------------------------------------------------------------------------------------------------  Cardiac Enzymes  Recent Labs Lab 04/05/15 1120  TROPONINI <0.03   ------------------------------------------------------------------------------------------------------------------  RADIOLOGY:  Dg Chest Portable 1 View  04/05/2015  CLINICAL DATA:  Found in rest  home covered in feces with tachycardia EXAM: PORTABLE CHEST - 1 VIEW COMPARISON:  12/25/2014 FINDINGS: Cardiac shadow is prominent. A hiatal hernia is noted. The lungs are clear bilaterally. No acute bony abnormality is seen. IMPRESSION: No acute abnormality noted. Electronically Signed   By: Inez Catalina M.D.   On: 04/05/2015 12:16    EKG:   Orders placed or performed during the hospital encounter of 04/05/15  . ED EKG  . ED EKG    IMPRESSION AND PLAN:   Sepsis UTI Acute metabolic encephalopathy Acute renal failure with dehydration Hypernatremia Lactic acidosis Diarrhea  The patient will be admitted to medical floor. Continue antibiotics including Azactam and vancomycin pharmacy to dose. Follow-up CBC, urine culture and blood culture. Fall and aspiration precautions. Start half normal saline and follow-up BMP. Follow-up lactic acid. Check stool test and C diff test.   All the records are reviewed and case discussed with ED provider. Management plans discussed with the patient, family and they are in agreement.  CODE STATUS: Full code  TOTAL TIME TAKING CARE OF THIS PATIENT: 56 minutes.    [name] M.D on 04/05/2015 at 2:32 PM  Between 7am to 6pm - Pager - [phone]  After 6pm  go to [email or web address] - password [email or web address]  Office  [phone]  CC: Primary care physician; [name], MD

## 2015-04-05 NOTE — Consult Note (Signed)
ANTIBIOTIC CONSULT NOTE - INITIAL  Pharmacy Consult for Vancomycin/Aztreonam Indication: Sepsis/UTI  Allergies  Allergen Reactions  . Penicillins Itching, Nausea And Vomiting, Rash and Other (See Comments)    Unable to obtain enough information to answer additional questions about this medication.      Patient Measurements: Height: 5\' 11"  (180.3 cm) Weight: 148 lb 14.4 oz (67.541 kg) IBW/kg (Calculated) : 75.3  Vital Signs: Temp: 98 F (36.7 C) (11/21 1623) Temp Source: Oral (11/21 1623) BP: 137/68 mmHg (11/21 1623) Pulse Rate: 95 (11/21 1623) Intake/Output from previous day:   Intake/Output from this shift:    Labs:  Recent Labs  04/05/15 1120  WBC 12.3*  HGB 13.9  PLT 179  CREATININE 1.82*   Estimated Creatinine Clearance: 35 mL/min (by C-G formula based on Cr of 1.82). No results for input(s): VANCOTROUGH, VANCOPEAK, VANCORANDOM, GENTTROUGH, GENTPEAK, GENTRANDOM, TOBRATROUGH, TOBRAPEAK, TOBRARND, AMIKACINPEAK, AMIKACINTROU, AMIKACIN in the last 72 hours.   Microbiology: No results found for this or any previous visit (from the past 720 hour(s)).  Medical History: Past Medical History  Diagnosis Date  . Difficulty walking   . Muscle weakness   . Dysphagia, oropharyngeal   . Reflux   . Cancer (HCC)     renal cell carcinoma  . Mental retardation   . Psoriasis     Medications:  Scheduled:  . acidophilus  1 capsule Oral QHS  . aztreonam  2 g Intravenous 3 times per day  . chlorhexidine  0.5 mL Mouth/Throat BID  . doxazosin  2 mg Oral Daily  . folic acid  5 mg Oral Once per day on Mon Tue Wed Thu Fri Sat  . free water  200 mL Per Tube 3 times per day  . heparin  5,000 Units Subcutaneous 3 times per day  . [START ON 04/06/2015] pantoprazole  40 mg Oral QAC breakfast  . vancomycin  1,000 mg Intravenous Once  . [START ON 04/06/2015] vancomycin  1,000 mg Intravenous Q24H   Infusions:  . sodium chloride 1,000 mL (04/05/15 1802)   Assessment: 74 y.o.  male with a known history of renal cancer, mental retardation and dysphagia. The patient was found with  tachycardia, leukocytosis and UTI.   Goal of Therapy:  Vancomycin trough level 15-20 mcg/ml  Plan:  Vancomycin 1g IV given at 18:19.  Will continue with Vancomycin 1g IV Q24H beginning on 11/22 at 01:30.  Trough level ordered prior to 5th dose on 11/25 at 01:00.   Aztreonam 2g IV given in ED at 13:47.  Will continue with Aztreonam 2g IV Q8H.    Follow up culture results .   [place], RPh  04/05/2015,6:30 PM

## 2015-04-05 NOTE — ED Notes (Signed)
Called report to karen on 1C, who informed me that they may not take pt due to him being on isolation and they have too many already. They are calling [facility] and will call back.

## 2015-04-05 NOTE — ED Notes (Addendum)
Pt from group home covered in feces. EMS called out by home health nurse for rapid HR and diarrhea. Pt had indwelling urinary catheter that was removed during assessment as it showed signs of infection.

## 2015-04-06 DIAGNOSIS — E440 Moderate protein-calorie malnutrition: Secondary | ICD-10-CM | POA: Insufficient documentation

## 2015-04-06 LAB — CBC
HCT: 33.9 % — ABNORMAL LOW (ref 40.0–52.0)
Hemoglobin: 11.3 g/dL — ABNORMAL LOW (ref 13.0–18.0)
MCH: 32.5 pg (ref 26.0–34.0)
MCHC: 33.3 g/dL (ref 32.0–36.0)
MCV: 97.4 fL (ref 80.0–100.0)
PLATELETS: 140 10*3/uL — AB (ref 150–440)
RBC: 3.48 MIL/uL — AB (ref 4.40–5.90)
RDW: 12.9 % (ref 11.5–14.5)
WBC: 8.8 10*3/uL (ref 3.8–10.6)

## 2015-04-06 LAB — BASIC METABOLIC PANEL
Anion gap: 6 (ref 5–15)
BUN: 41 mg/dL — AB (ref 6–20)
CHLORIDE: 110 mmol/L (ref 101–111)
CO2: 22 mmol/L (ref 22–32)
CREATININE: 1.45 mg/dL — AB (ref 0.61–1.24)
Calcium: 8.4 mg/dL — ABNORMAL LOW (ref 8.9–10.3)
GFR calc Af Amer: 54 mL/min — ABNORMAL LOW (ref >60)
GFR calc non Af Amer: 47 mL/min — ABNORMAL LOW (ref >60)
GLUCOSE: 127 mg/dL — AB (ref 65–99)
Potassium: 4.1 mmol/L (ref 3.5–5.1)
Sodium: 138 mmol/L (ref 135–145)

## 2015-04-06 LAB — HEMOGLOBIN A1C: Hgb A1c MFr Bld: 5.9 % (ref 4.0–6.0)

## 2015-04-06 MED ORDER — DEXTROSE 5 % IV SOLN
1.0000 g | INTRAVENOUS | Status: DC
  Administered 2015-04-06: 1 g via INTRAVENOUS
  Filled 2015-04-06 (×2): qty 10

## 2015-04-06 MED ORDER — JEVITY 1.5 CAL/FIBER PO LIQD: 237.0000 mL | Freq: Four times a day (QID) | ORAL | Status: DC

## 2015-04-06 MED ORDER — VANCOMYCIN HCL IN DEXTROSE 1-5 GM/200ML-% IV SOLN
1000.0000 mg | INTRAVENOUS | Status: DC
  Filled 2015-04-06: qty 200

## 2015-04-06 MED ORDER — JEVITY 1.5 CAL/FIBER PO LIQD
237.0000 mL | Freq: Four times a day (QID) | ORAL | Status: DC
  Administered 2015-04-06 – 2015-04-07 (×4): 237 mL

## 2015-04-06 NOTE — Clinical Social Work Note (Signed)
Patient newly admitted from [place] owned by [facility]: [phone]. Patient's appearance on arrival was one of concern as he was covered in feces and his foley catheter was in poor condition as well. CSW to follow up with [facility] to find out the conditions of the home and situation that precipitated the EMS call. CSW has left a message for [employer]. Shela Leff MSW,LCSW [phone]

## 2015-04-06 NOTE — Progress Notes (Addendum)
Initial Nutrition Assessment  Non-severe (moderate) malnutrition in context of chronic illness  INTERVENTION: Coordination of care: spoke with Dr. Verdell Carmine and planning to order SLP consult as pt taking nectar thick liquids at group home (per Berenice Primas at [facility]) prior to admission. Spoke with SLP and will likely take down for MBSS tomorrow. EN: MD agreeable to restarting jevity 1.5, 4 cans per day between meals (per Berenice Primas has been giving to pt secondary to wt loss). Will provide 1422 kcals, 60 g of protein, 729ml free water (from formula). Recommend additional free water of 54ml before and after feeding. Will discontinue free water of 271ml q 8 hr.  Enteral nutrition order may need to be adjusted based on SLP recommendations, po intake during admission to meet pt's need.  NUTRITION DIAGNOSIS:   Increased nutrient needs related to acute illness as evidenced by estimated needs.    GOAL:   Patient will meet greater than or equal to 90% of their needs    MONITOR:    (Energy intake, Digestive system, Electrolyte and renal profile,)  REASON FOR ASSESSMENT:   Malnutrition Screening Tool    ASSESSMENT:      Pt admitted with UTI, sepsis, diarrhea on admission Past Medical History  Diagnosis Date  . Difficulty walking   . Muscle weakness   . Dysphagia, oropharyngeal   . Reflux   . Cancer (HCC)     renal cell carcinoma  . Mental retardation   . Psoriasis     Current Nutrition: ate 100% of breakfast and lunch today per RN, Santiago Glad, tolerated well, no coughing noted per nursing  Food/Nutrition-Related History: Called [facility] and spoke with Berenice Primas.  Berenice Primas reports that she has been giving pt regular consistency food but nectar thick liquids.  As recently as Sunday per Berenice Primas ate steak, baked potato and salad.  Berenice Primas reports pt does not like nectar thick liquids.  Berenice Primas reports pt usually eats breakfast and lunch well but not much at supper.    Berenice Primas reports  has been giving pt 4 cans of jevity 1.5 no matter if pt eats meals well or not secondary to weight loss. Previously on admission in August [name] was giving pt 3 cans of feeding only if pt ate <50% of meals.  This Probation officer clarified this with Berenice Primas and she agreed that she was giving pt jevity TID if pt ate <50% of meals but per the MD she was giving pt 4 cans of jevity 1.5 at this time no matter if he ate or not secondary to weight loss.    Scheduled Medications:  . acidophilus  1 capsule Oral QHS  . cefTRIAXone (ROCEPHIN)  IV  1 g Intravenous Q24H  . chlorhexidine  0.5 mL Mouth/Throat BID  . doxazosin  2 mg Oral Daily  . folic acid  5 mg Oral Once per day on Mon Tue Wed Thu Fri Sat  . free water  200 mL Per Tube 3 times per day  . heparin  5,000 Units Subcutaneous 3 times per day  . pantoprazole  40 mg Oral QAC breakfast    Continuous Medications:  . sodium chloride 100 mL/hr at 04/06/15 0634     Electrolyte/Renal Profile and Glucose Profile:   Recent Labs Lab 04/05/15 1120 04/05/15 1716 04/06/15 0415  NA 147*  --  138  K 3.6  --  4.1  CL 118*  --  110  CO2 18*  --  22  BUN 53*  --  41*  CREATININE 1.82*  --  1.45*  CALCIUM 9.5  --  8.4*  MG  --  1.9  --   GLUCOSE 123*  --  127*   Protein Profile:   Recent Labs Lab 04/05/15 1120  ALBUMIN 3.9    Gastrointestinal Profile: Last BM:11/21   Nutrition-Focused Physical Exam Findings: Nutrition-Focused physical exam completed. Findings are mild/moderate fat depletion,  Mild/moderate muscle depletion, and none edema.      Weight Change: 10% weight loss in the 3 months per wt encounters  Wt Readings from Last 10 Encounters:  04/05/15 148 lb 14.4 oz (67.541 kg)  02/14/15 156 lb 11.9 oz (71.098 kg)  12/29/14 165 lb 4.8 oz (74.98 kg)      Diet Order:  Diet heart healthy/carb modified Room service appropriate?: Yes; Fluid consistency:: Thin  Skin:   reviewed   Height:   Ht Readings from Last 1 Encounters:   04/05/15 5\' 11"  (1.803 m)    Weight:   Wt Readings from Last 1 Encounters:  04/05/15 148 lb 14.4 oz (67.541 kg)    Ideal Body Weight:     BMI:  Body mass index is 20.78 kg/(m^2).  Estimated Nutritional Needs:   Kcal:  BEE 1442 kcals (IF 1.0-1.2, AF 1.2) 1730-2076 kcals/d.   Protein:  (1.2 -1.5g/kg) 80-100 g/d  Fluid:  (25-3ml/kg) 1675-204ml/d  EDUCATION NEEDS:   No education needs identified at this time  HIGH Care Level  Samantha Olivera B. Zenia Resides, [place], [place] (pager)

## 2015-04-06 NOTE — Consult Note (Signed)
ANTIBIOTIC CONSULT NOTE - FOLLOW UP   Pharmacy Consult for Vancomycin/Aztreonam Indication: Sepsis/UTI  Allergies  Allergen Reactions  . Penicillins Itching, Nausea And Vomiting, Rash and Other (See Comments)    Unable to obtain enough information to answer additional questions about this medication.      Patient Measurements: Height: 5\' 11"  (180.3 cm) Weight: 148 lb 14.4 oz (67.541 kg) IBW/kg (Calculated) : 75.3  Vital Signs: Temp: 98.2 F (36.8 C) (11/22 0624) Temp Source: Oral (11/22 0624) BP: 133/52 mmHg (11/22 0624) Pulse Rate: 55 (11/22 0624) Intake/Output from previous day: 11/21 0701 - 11/22 0700 In: 1083.5 [I.V.:1033.5; IV Piggyback:50] Out: 1400 [id number] Intake/Output from this shift: Total I/O In: 343 [I.V.:343] Out: -   Labs:  Recent Labs  04/05/15 1120 04/06/15 0415  WBC 12.3* 8.8  HGB 13.9 11.3*  PLT 179 140*  CREATININE 1.82* 1.45*   Estimated Creatinine Clearance: 44 mL/min (by C-G formula based on Cr of 1.45). No results for input(s): VANCOTROUGH, VANCOPEAK, VANCORANDOM, GENTTROUGH, GENTPEAK, GENTRANDOM, TOBRATROUGH, TOBRAPEAK, TOBRARND, AMIKACINPEAK, AMIKACINTROU, AMIKACIN in the last 72 hours.   Microbiology: No results found for this or any previous visit (from the past 720 hour(s)).  Medical History: Past Medical History  Diagnosis Date  . Difficulty walking   . Muscle weakness   . Dysphagia, oropharyngeal   . Reflux   . Cancer (HCC)     renal cell carcinoma  . Mental retardation   . Psoriasis     Medications:  Scheduled:  . acidophilus  1 capsule Oral QHS  . aztreonam  2 g Intravenous 3 times per day  . chlorhexidine  0.5 mL Mouth/Throat BID  . doxazosin  2 mg Oral Daily  . folic acid  5 mg Oral Once per day on Mon Tue Wed Thu Fri Sat  . free water  200 mL Per Tube 3 times per day  . heparin  5,000 Units Subcutaneous 3 times per day  . pantoprazole  40 mg Oral QAC breakfast  . vancomycin  1,000 mg Intravenous Q18H    Infusions:  . sodium chloride 100 mL/hr at 04/06/15 [id number]   Assessment: 74 y.o. male with a known history of renal cancer, mental retardation and dysphagia. The patient was found with  tachycardia, leukocytosis and UTI.   Goal of Therapy:  Vancomycin trough level 15-20 mcg/ml  Plan:  Vancomycin 1g IV given at 18:19.  Will continue with Vancomycin 1g IV Q24H beginning on 11/22 at 01:30.  Trough level ordered prior to 5th dose on 11/25 at 01:00.   11/22: Scr improved. New Kinetics calculated @Kel  (hr-1): 0.041 Half-life (hrs): 16.91 Vd (liters): 47.25 (factor used: 0.7 L/kg)  Will change to Vancomycin 1 g IV q18 hours. Will order Vancomycin trough level to be drawn @ 0830 on 11/24.    Continue Azactam 2 g IV q8 hours.   Follow up culture results .   Larene Beach, PharmD   04/06/2015,11:11 AM

## 2015-04-06 NOTE — Clinical Social Work Note (Signed)
CSW spoke with Matthew Poole at [facility] who has been patient's home visiting nurse for several years. Ms. Matthew Poole was the nurse out at the facility who called 911 to transport patient to the ED. CSW asked Matthew Poole if she had any concerns regarding patient's care or condition he was in and Matthew Poole stated that she has no concerns about patient's care in his family care home and that he has received adequate care from his caregivers. Ms. Matthew Poole stated that patient had altered mental status and would not allow her or the group home to clean him up and stated that she thought he could be cleaned best in a more controlled environment (here at the hospital). After this phone conversation it was noted that patient's PEG tube was not clean. CSW has a message into Matthew Poole to inquire about the PEG tube condition and the foley catheter condition.  Matthew Poole MSW,LCSW [phone]

## 2015-04-06 NOTE — Progress Notes (Signed)
Matthew Poole at [place] NAME: Matthew Poole    MR#:  [id number]  DATE OF BIRTH:  February 13, 1941  SUBJECTIVE:   Patient presented to the hospital due to diarrhea and also noted to have urinary tract infection. Patient presented with leukocytosis, tachycardia and suspected have sepsis due to UTI. Patient himself is a very poor historian.  REVIEW OF SYSTEMS:    Review of Systems  Unable to perform ROS: mental acuity   Nutrition: Heart healthy Tolerating Diet: Yes Tolerating PT: Await evaluation  DRUG ALLERGIES:   Allergies  Allergen Reactions  . Penicillins Itching, Nausea And Vomiting, Rash and Other (See Comments)    Unable to obtain enough information to answer additional questions about this medication.      VITALS:  Blood pressure 125/59, pulse 55, temperature 98.9 F (37.2 C), temperature source Oral, resp. rate 18, height 5\' 11"  (1.803 m), weight 67.541 kg (148 lb 14.4 oz), SpO2 98 %.  PHYSICAL EXAMINATION:   Physical Exam  GENERAL:  74 y.o.-year-old patient lying in the bed in mild resp. Distress.  EYES: Pupils equal, round, reactive to light and accommodation. No scleral icterus. Extraocular muscles intact.  HEENT: Head atraumatic, normocephalic. Oropharynx and nasopharynx clear. Dry Oral Mucosa.  NECK:  Supple, no jugular venous distention. No thyroid enlargement, no tenderness.  LUNGS: Normal breath sounds bilaterally, no wheezing, rales, b/l upper airway rhonchi. No use of accessory muscles of respiration.  CARDIOVASCULAR: S1, S2 normal. No murmurs, rubs, or gallops.  ABDOMEN: Soft, nontender, nondistended. Bowel sounds present. No organomegaly or mass. + PEG tube in place.   EXTREMITIES: No cyanosis, clubbing or edema b/l.    NEUROLOGIC: Cranial nerves II through XII are intact. No focal Motor or sensory deficits b/l. Globally weak   PSYCHIATRIC: The patient is alert and oriented x 1.  SKIN: No obvious rash, lesion,  or ulcer.    LABORATORY PANEL:   CBC  Recent Labs Lab 04/06/15 0415  WBC 8.8  HGB 11.3*  HCT 33.9*  PLT 140*   ------------------------------------------------------------------------------------------------------------------  Chemistries   Recent Labs Lab 04/05/15 1120 04/05/15 1716 04/06/15 0415  NA 147*  --  138  K 3.6  --  4.1  CL 118*  --  110  CO2 18*  --  22  GLUCOSE 123*  --  127*  BUN 53*  --  41*  CREATININE 1.82*  --  1.45*  CALCIUM 9.5  --  8.4*  MG  --  1.9  --   AST 21  --   --   ALT 11*  --   --   ALKPHOS 98  --   --   BILITOT 0.5  --   --    ------------------------------------------------------------------------------------------------------------------  Cardiac Enzymes  Recent Labs Lab 04/05/15 1120  TROPONINI <0.03   ------------------------------------------------------------------------------------------------------------------  RADIOLOGY:  Dg Chest Portable 1 View  04/05/2015  CLINICAL DATA:  Found in rest home covered in feces with tachycardia EXAM: PORTABLE CHEST - 1 VIEW COMPARISON:  12/25/2014 FINDINGS: Cardiac shadow is prominent. A hiatal hernia is noted. The lungs are clear bilaterally. No acute bony abnormality is seen. IMPRESSION: No acute abnormality noted. Electronically Signed   By: Inez Catalina M.D.   On: 04/05/2015 12:16     ASSESSMENT AND PLAN:   75 year old male with past medical history of mental retardation, chronic dysphagia, GERD, history of renal cell carcinoma, psoriasis, came into the hospital due to altered mental status and noted to have  UTI.  #1 sepsis-this was a presenting diagnosis on admission. Patient presented with tachycardia, leukocytosis and noted to have abnormal urinalysis. -Due to UTI, patient initially was started on IV vancomycin, Azactam. -I will now down antibiotics to just IV ceftriaxone. Cultures so far negative. Patient is afebrile, hemodynamically stable.  #2 urinary tract infection-will  switch from IV Azactam to IV ceftriaxone and follow. -Follow urine cultures.  #3 acute renal failure-likely ATN due to sepsis. -Continue IV fluids and BUN/creatinine is improving.  #4 BPH-continue doxazosin.  #5 GERD-continue Protonix  #6 chronic dysphagia-patient is status post PEG tube placement. Patient can actually eat but also gets supplemental tube feeds due to weight loss and poor Caloric intake.  - will get Speech eval.  Cont. To have dietary supplement w/ tube feeds as tolerated.    All the records are reviewed and case discussed with Care Management/Social Workerr. Management plans discussed with the patient, family and they are in agreement.  CODE STATUS: Full  DVT Prophylaxis: Heparin subcutaneous  TOTAL TIME TAKING CARE OF THIS PATIENT: 30 minutes.   POSSIBLE D/C IN 1-2 DAYS, DEPENDING ON CLINICAL CONDITION.   Matthew Poole M.D on 04/06/2015 at 2:51 PM  Between 7am to 6pm - Pager - [phone]  After 6pm go to [email or web address] - password [email or web address] [facility]  Office  [phone]  CC: Primary care physician; [name], MD

## 2015-04-06 NOTE — Progress Notes (Signed)
[facility]  Patient Status: active   [facility] is providing the following services: SN  If patient discharges after hours, please call [phone].   Matthew Poole 04/06/2015, 9:44 AM

## 2015-04-06 NOTE — Progress Notes (Signed)
Speech Therapy Note: received order, reviewed chart notes, consulted NSG and Dietician. Dietician reported the group home has been feeding him an oral diet of regular foods(including steak and salad recently) as well as giving 3-4 cans of TF via PEG daily(unsure of how long the pt has had the PEG). The group home stated they were giving pt Nectar consistency liquids d/t concern for swallowing issues(?). After reviewing chart notes(admission condition) and discussion w/ Dietician, an objective swallow study would be beneficial to establish the best information re: pt's swallowing status d/t pt's baseline of mental retardation and dx of oropharyngeal dysphagia per chart. MBSS order placed; ST will f/u tomorrow. NSG updated.

## 2015-04-06 NOTE — Clinical Documentation Improvement (Signed)
Internal Medicine  A cause and effect relationship may not be assumed and must be documented by a provider.  Please clarify the relationship, if any, between Sepsis and Chronic indwelling foley catheter.  Are the conditions:  Due to or associated with each other  Unrelated to each other  Other  Clinically Undetermined  Supporting Information:(As per notes) "He also has a chronic indwelling Foley that our nurse removed upon arrival because it was "disgusting"."  Please update your documentation within the medical record to reflect your response to this query. Thank you.  Please exercise your independent, professional judgment when responding. A specific answer is not anticipated or expected.  Thank You, Alessandra Grout, RN, BSN, CCDS,Clinical Documentation Specialist:  [phone]  [phone]=[facility] [place]- Health Information Management

## 2015-04-07 ENCOUNTER — Inpatient Hospital Stay: Payer: Commercial Managed Care - HMO

## 2015-04-07 LAB — BASIC METABOLIC PANEL
ANION GAP: 6 (ref 5–15)
BUN: 39 mg/dL — ABNORMAL HIGH (ref 6–20)
CHLORIDE: 110 mmol/L (ref 101–111)
CO2: 22 mmol/L (ref 22–32)
CREATININE: 1.56 mg/dL — AB (ref 0.61–1.24)
Calcium: 8.5 mg/dL — ABNORMAL LOW (ref 8.9–10.3)
GFR calc non Af Amer: 43 mL/min — ABNORMAL LOW (ref >60)
GFR, EST AFRICAN AMERICAN: 49 mL/min — AB (ref >60)
Glucose, Bld: 100 mg/dL — ABNORMAL HIGH (ref 65–99)
POTASSIUM: 4.7 mmol/L (ref 3.5–5.1)
SODIUM: 138 mmol/L (ref 135–145)

## 2015-04-07 LAB — CBC
HEMATOCRIT: 35.1 % — AB (ref 40.0–52.0)
HEMOGLOBIN: 11.9 g/dL — AB (ref 13.0–18.0)
MCH: 32.7 pg (ref 26.0–34.0)
MCHC: 33.8 g/dL (ref 32.0–36.0)
MCV: 96.5 fL (ref 80.0–100.0)
Platelets: 148 10*3/uL — ABNORMAL LOW (ref 150–440)
RBC: 3.63 MIL/uL — AB (ref 4.40–5.90)
RDW: 12.9 % (ref 11.5–14.5)
WBC: 8.3 10*3/uL (ref 3.8–10.6)

## 2015-04-07 LAB — URINE CULTURE: CULTURE: NO GROWTH

## 2015-04-07 MED ORDER — LEVOFLOXACIN 500 MG PO TABS: 500.0000 mg | ORAL_TABLET | Freq: Every day | ORAL | Status: DC

## 2015-04-07 MED ORDER — ALBUTEROL SULFATE (2.5 MG/3ML) 0.083% IN NEBU: 2.5000 mg | INHALATION_SOLUTION | Freq: Four times a day (QID) | RESPIRATORY_TRACT | Status: DC | PRN

## 2015-04-07 MED ORDER — CEFUROXIME AXETIL 500 MG PO TABS: 500.0000 mg | ORAL_TABLET | Freq: Two times a day (BID) | ORAL | Status: DC

## 2015-04-07 NOTE — Discharge Instructions (Signed)
Change Foley every 3 weeks. Foley care. Flush tube feeds with 100 mL free water before and after feeding. Free water 200 mL 3 times a day.

## 2015-04-07 NOTE — Evaluation (Signed)
Objective Swallowing Evaluation:    Patient Details  Name: Matthew Poole MRN: [id number] Date of Birth: [birth year]  Today's Date: 04/07/2015 Time: SLP Start Time (ACUTE ONLY): 0830-SLP Stop Time (ACUTE ONLY): 0930 SLP Time Calculation (min) (ACUTE ONLY): 60 min  Past Medical History:  Past Medical History  Diagnosis Date  . Difficulty walking   . Muscle weakness   . Dysphagia, oropharyngeal   . Reflux   . Cancer (HCC)     renal cell carcinoma  . Mental retardation   . Psoriasis    Past Surgical History:  Past Surgical History  Procedure Laterality Date  . Peg placement      Subjective: Patient behavior: (alertness, ability to follow instructions, etc.): Patient is alert and oriented to swallowing.  He is able to follow simple directions and answer simple questions.  Chief complaint:  The patient is on a modified diet at his group home.  He has a PEG (2014) and prior MBSs at this facility (07/06/2014, 10/09/2013, 08/25/2013)   Objective:  Radiological Procedure: A videoflouroscopic evaluation of oral-preparatory, reflex initiation, and pharyngeal phases of the swallow was performed; as well as a screening of the upper esophageal phase.  I. POSTURE: Upright in MBS chair  II. VIEW: Lateral  III. COMPENSATORY STRATEGIES: A requested dry swallow is incomplete and ineffective in clearing pharyngeal residue.  IV. BOLUSES ADMINISTERED:   Thin Liquid: 2 small cup rim sips   Nectar-thick Liquid: 2 small cup rim sips, 4 rapid sips    Puree: 2 teaspoon boluses   Mechanical Soft: 1/4 graham cracker in applesauce  V. RESULTS OF EVALUATION: A. ORAL PREPARATORY PHASE: (The lips, tongue, and velum are observed for strength and coordination) Within functional limits        **Overall Severity Rating: WFL  B. SWALLOW INITIATION/REFLEX: (The reflex is normal if "triggered" by the time the bolus reached the base of the tongue) Within normal limits  **Overall Severity Rating:  WNL  C. PHARYNGEAL PHASE: (Pharyngeal function is normal if the bolus shows rapid, smooth, and continuous transit through the pharynx and there is no pharyngeal residue after the swallow): Reduced tongue base retraction and reduced hyolaryngeal movement (heavier bolus improves hyolaryngeal movement and pharyngeal clearance).  Moderate residue throughout pharynx (valleculae, posterior pharyngeal wall, pyriform sinuses)  **Overall Severity Rating: Moderate  D. LARYNGEAL PENETRATION: (Material entering into the laryngeal inlet/vestibule but not aspirated)  E. ASPIRATION: Silent aspiration during swallow from pyriform sinus residue  F. ESOPHAGEAL PHASE: (Screening of the upper esophagus) No observed abnormality within the cervical esophagus  ASSESSMENT: This 74 year old man; with PEG since 04/2013 and  prior MBS at this facility 07/06/2014, 10/10/2014, and 08/25/2013; is presenting with moderate pharyngeal dysphagia characterized by decreased hyolaryngeal movement and decreased tongue base retraction with moderate to mild residue depending on size/weight of bolus and level of effort expended during swallowing. A requested dry swallow is incomplete and ineffective in clearing pharyngeal residue.   There is silent laryngeal penetration/aspiration of thin liquid from pyriform sinus residue.  Oral control of the bolus including oral hold, rotary mastication, and anterior to posterior transfer is within normal limits.  Timing of the pharyngeal swallow is within functional limits.   This study is similar to the previous study 9 months ago.  Recommend Dysphagia III diet with nectar-thick liquids.  PLAN/RECOMMENDATIONS:  A. Diet: Dysphagia III with nectar-thick liquids   B. Swallowing Precautions: Upright for all POs; have all liquids thickened; administer meds on pureed foods; oral hygiene  C. Recommended consultation to N/A   D. Therapy recommendations N/A   E. Results and recommendations were discussed  with the patient immediately following the study and with the primary inpatient SLP               Leroy Sea, [place], [place] 04/07/2015, 11:14 AM

## 2015-04-07 NOTE — NC FL2 (Signed)
[place] LEVEL OF CARE SCREENING TOOL     IDENTIFICATION  Patient Name: Matthew Poole Birthdate: 1940/11/17 Sex: male Admission Date (Current Location): 04/05/2015  [facility] and [place] Number: Engineering geologist and Address:  [facility], [address] [place], [place] [address]      Provider Number: [id number]  Attending Physician Name and Address:  Loletha Grayer, MD  Relative Name and Phone Number:       Current Level of Care: Hospital Recommended Level of Care: [place] Prior Approval Number:    Date Approved/Denied:   PASRR Number:    Discharge Plan:  (group home)    Current Diagnoses: Patient Active Problem List   Diagnosis Date Noted  . Malnutrition of moderate degree 04/06/2015  . UTI (lower urinary tract infection) 04/05/2015  . ARF (acute renal failure) ([place]) 04/05/2015  . Sepsis secondary to UTI ([place]) 12/25/2014  . Sepsis ([place]) 12/25/2014    Orientation ACTIVITIES/SOCIAL BLADDER RESPIRATION    Self  Active Indwelling catheter    BEHAVIORAL SYMPTOMS/MOOD NEUROLOGICAL BOWEL NUTRITION STATUS   (none)  (none) Incontinent    PHYSICIAN VISITS COMMUNICATION OF NEEDS Height & Weight Skin  30 days Verbally   148 lbs.            AMBULATORY STATUS RESPIRATION    Supervision limited        Personal Care Assistance Level of Assistance  Bathing, Dressing Bathing Assistance: Limited assistance   Dressing Assistance: Limited assistance      Functional Limitations Info                SPECIAL CARE FACTORS FREQUENCY                      Additional Factors Info                  Current Medications (04/07/2015): Current Facility-Administered Medications  Medication Dose Route Frequency Provider Last Rate Last Dose  . acetaminophen (TYLENOL) tablet 650 mg  650 mg Oral Q6H PRN [name], MD       Or  . acetaminophen (TYLENOL) suppository 650 mg  650 mg Rectal Q6H PRN [name],  MD      . acidophilus (RISAQUAD) capsule 1 capsule  1 capsule Oral QHS [name], MD   1 capsule at 04/06/15 2107  . albuterol (PROVENTIL) (2.5 MG/3ML) 0.083% nebulizer solution 2.5 mg  2.5 mg Nebulization Q2H PRN [name], MD      . cefUROXime (CEFTIN) tablet 500 mg  500 mg Oral BID WC Loletha Grayer, MD      . chlorhexidine (PERIDEX) 0.12 % solution 0.5 mL  0.5 mL Mouth/Throat BID [name], MD   0.5 mL at 04/06/15 0934  . docusate sodium (COLACE) capsule 100 mg  100 mg Oral BID PRN [name], MD      . doxazosin (CARDURA) tablet 2 mg  2 mg Oral Daily [name], MD   2 mg at 04/06/15 0933  . feeding supplement (JEVITY 1.5 CAL/FIBER) liquid 237 mL  237 mL Per Tube QID Henreitta Leber, MD   237 mL at 04/07/15 1118  . folic acid (FOLVITE) tablet 5 mg  5 mg Oral Once per day on Mon Tue Wed Thu Fri Sat [name], MD   5 mg at 04/07/15 1115  . heparin injection 5,000 Units  5,000 Units Subcutaneous 3 times per day [name], MD   5,000 Units  at 04/07/15 0626  . ondansetron (ZOFRAN) tablet 4 mg  4 mg Oral Q6H PRN [name], MD       Or  . ondansetron [facility]) injection 4 mg  4 mg Intravenous Q6H PRN [name], MD      . pantoprazole (PROTONIX) EC tablet 40 mg  40 mg Oral QAC breakfast [name], MD   40 mg at 04/07/15 1116   Do not use this list as official medication orders. Please verify with discharge summary.  Discharge Medications:   Medication List    STOP taking these medications        doxycycline 100 MG tablet  Commonly known as:  VIBRA-TABS     nitrofurantoin (macrocrystal-monohydrate) 100 MG capsule  Commonly known as:  MACROBID      TAKE these medications        4X PROBIOTIC PO  Take 1 capsule by mouth daily.     acetaminophen 325 MG tablet  Commonly known as:  TYLENOL  Take 325 mg by mouth every 4 (four) hours as needed for mild pain, fever or headache.     albuterol (2.5 MG/3ML) 0.083% nebulizer solution  Commonly known as:  PROVENTIL  Take 3 mLs (2.5 mg total) by  nebulization every 6 (six) hours as needed for wheezing.     chlorhexidine 0.12 % solution  Commonly known as:  PERIDEX  Use as directed 0.5 mLs in the mouth or throat 2 (two) times daily.     cholecalciferol 1000 UNITS tablet  Commonly known as:  VITAMIN D  Take 1,000 Units by mouth daily.     clotrimazole 1 % cream  Commonly known as:  LOTRIMIN  Apply 1 application topically 2 (two) times daily.     docusate 50 MG/5ML liquid  Commonly known as:  COLACE  Take 50 mg by mouth 2 (two) times daily as needed for mild constipation.     doxazosin 2 MG tablet  Commonly known as:  CARDURA  Take 2 mg by mouth daily.     feeding supplement (JEVITY 1.5 CAL) Liqd  Place 237 mLs into feeding tube 4 (four) times daily.     folic acid 1 MG tablet  Commonly known as:  FOLVITE  Take 5 mg by mouth daily. Pt takes Monday-Saturday.     levofloxacin 500 MG tablet  Commonly known as:  LEVAQUIN  Take 1 tablet (500 mg total) by mouth daily.     methotrexate 2.5 MG tablet  Commonly known as:  RHEUMATREX  Take 5 mg by mouth once a week. Patient takes on Sunday.    Caution:Chemotherapy. Protect from light.     multivitamin with iron-minerals liquid  Take 15 mLs by mouth daily.     omeprazole 20 MG capsule  Commonly known as:  PRILOSEC  Take 20 mg by mouth 2 (two) times daily.     triamcinolone cream 0.1 %  Commonly known as:  KENALOG  Apply 1 application topically 2 (two) times daily.     vitamin C 250 MG tablet  Commonly known as:  ASCORBIC ACID  Take 500 mg by mouth 2 (two) times daily.        Relevant Imaging Results:  Relevant Lab Results:  Recent Labs    Additional Information    Shela Leff, LCSW

## 2015-04-07 NOTE — Discharge Summary (Addendum)
Matthew Poole at [facility] NAME: Matthew Poole    MR#:  [id number]  DATE OF BIRTH:  11-24-1940  DATE OF ADMISSION:  04/05/2015 ADMITTING PHYSICIAN: [name], MD  DATE OF DISCHARGE: 04/07/2015  PRIMARY CARE PHYSICIAN: [name], MD    ADMISSION DIAGNOSIS:  Acute hypernatremia [E87.0] Sepsis secondary to UTI ([place]) [A41.9, N39.0]  DISCHARGE DIAGNOSIS:  Principal Problem:   Sepsis ([place]) Active Problems:   UTI (lower urinary tract infection)   ARF (acute renal failure) (HCC)   Malnutrition of moderate degree   SECONDARY DIAGNOSIS:   Past Medical History  Diagnosis Date  . Difficulty walking   . Muscle weakness   . Dysphagia, oropharyngeal   . Reflux   . Cancer (HCC)     renal cell carcinoma  . Mental retardation   . Psoriasis     HOSPITAL COURSE:   1. Clinical sepsis- unclear etiology. Blood cultures negative 2,  urine culture negative and chest x-ray negative. Patient was given aggressive antibiotics and switched over to Rocephin. I will switch over to by mouth Levaquin upon discharge and finish up her course. 2. Diarrhea on presentation- no diarrhea here. 3. Frequent urinary tract infections. Foley care needed. Change Foley every 3 weeks. Can go back on the nitrofurantoin once finished Levaquin course. 4. Acute renal failure on chronic kidney disease stage III- improved while here 5. Malnutrition continue tube feeding and diet. Patient taken down for modified barium swallow and is on his baseline diet.  DISCHARGE CONDITIONS:   [place]  CONSULTS OBTAINED:  None  DRUG ALLERGIES:   Allergies  Allergen Reactions  . Penicillins Itching, Nausea And Vomiting, Rash and Other (See Comments)    Unable to obtain enough information to answer additional questions about this medication.      DISCHARGE MEDICATIONS:   Current Discharge Medication List    START taking these medications   Details  albuterol  (PROVENTIL) (2.5 MG/3ML) 0.083% nebulizer solution Take 3 mLs (2.5 mg total) by nebulization every 6 (six) hours as needed for wheezing. Qty: 75 mL, Refills: 12    levofloxacin (LEVAQUIN) 500 MG tablet Take 1 tablet (500 mg total) by mouth daily. Qty: 8 tablet, Refills: 0      CONTINUE these medications which have NOT CHANGED   Details  acetaminophen (TYLENOL) 325 MG tablet Take 325 mg by mouth every 4 (four) hours as needed for mild pain, fever or headache.     chlorhexidine (PERIDEX) 0.12 % solution Use as directed 0.5 mLs in the mouth or throat 2 (two) times daily.     cholecalciferol (VITAMIN D) 1000 UNITS tablet Take 1,000 Units by mouth daily.    clotrimazole (LOTRIMIN) 1 % cream Apply 1 application topically 2 (two) times daily.    docusate (COLACE) 50 MG/5ML liquid Take 50 mg by mouth 2 (two) times daily as needed for mild constipation.    doxazosin (CARDURA) 2 MG tablet Take 2 mg by mouth daily.    folic acid (FOLVITE) 1 MG tablet Take 5 mg by mouth daily. Pt takes Monday-Saturday.    methotrexate (RHEUMATREX) 2.5 MG tablet Take 5 mg by mouth once a week. Patient takes on Sunday.    Caution:Chemotherapy. Protect from light.    Multiple Vitamins-Minerals (MULTIVITAMIN WITH IRON-MINERALS) liquid Take 15 mLs by mouth daily.     Nutritional Supplements (FEEDING SUPPLEMENT, JEVITY 1.5 CAL,) LIQD Place 237 mLs into feeding tube 4 (four) times daily.    omeprazole (PRILOSEC) 20 MG  capsule Take 20 mg by mouth 2 (two) times daily.     Probiotic Product (4X PROBIOTIC PO) Take 1 capsule by mouth daily.     triamcinolone cream (KENALOG) 0.1 % Apply 1 application topically 2 (two) times daily.    vitamin C (ASCORBIC ACID) 250 MG tablet Take 500 mg by mouth 2 (two) times daily.       STOP taking these medications     doxycycline (VIBRA-TABS) 100 MG tablet      nitrofurantoin, macrocrystal-monohydrate, (MACROBID) 100 MG capsule          DISCHARGE INSTRUCTIONS:   Follow-up  with PMD one week  If you experience worsening of your admission symptoms, develop shortness of breath, life threatening emergency, suicidal or homicidal thoughts you must seek medical attention immediately by calling 911 or calling your MD immediately  if symptoms less severe.  You Must read complete instructions/literature along with all the possible adverse reactions/side effects for all the Medicines you take and that have been prescribed to you. Take any new Medicines after you have completely understood and accept all the possible adverse reactions/side effects.   Please note  You were cared for by a hospitalist during your hospital stay. If you have any questions about your discharge medications or the care you received while you were in the hospital after you are discharged, you can call the unit and asked to speak with the hospitalist on call if the hospitalist that took care of you is not available. Once you are discharged, your primary care physician will handle any further medical issues. Please note that NO REFILLS for any discharge medications will be authorized once you are discharged, as it is imperative that you return to your primary care physician (or establish a relationship with a primary care physician if you do not have one) for your aftercare needs so that they can reassess your need for medications and monitor your lab values.    Today   CHIEF COMPLAINT:   Chief Complaint  Patient presents with  . Diarrhea    HISTORY OF PRESENT ILLNESS:  Matthew Poole  is a 74 y.o. male presented with diarrhea. Patient has not had diarrhea here   VITAL SIGNS:  Blood pressure 137/56, pulse 58, temperature 97.8 F (36.6 C), temperature source Oral, resp. rate 18, height 5\' 11"  (1.803 m), weight 67.541 kg (148 lb 14.4 oz), SpO2 98 %.  I/O:    PHYSICAL EXAMINATION:  GENERAL:  74 y.o.-year-old patient lying in the bed with no acute distress.  EYES: Pupils equal, round, reactive  to light and accommodation. No scleral icterus. Extraocular muscles intact.  HEENT: Head atraumatic, normocephalic. Oropharynx and nasopharynx clear.  NECK:  Supple, no jugular venous distention. No thyroid enlargement, no tenderness.  LUNGS: Coarse breath sounds bilaterally, no wheezing, rales,rhonchi or crepitation. No use of accessory muscles of respiration.  CARDIOVASCULAR: S1, S2 normal. No murmurs, rubs, or gallops.  ABDOMEN: Soft, non-tender, non-distended. Bowel sounds present. No organomegaly or mass.  EXTREMITIES: No pedal edema, cyanosis, or clubbing.  NEUROLOGIC: Some slurred speech PSYCHIATRIC: The patient is alert.  SKIN: No obvious rash, lesion, or ulcer.   DATA REVIEW:   CBC  Recent Labs Lab 04/07/15 0449  WBC 8.3  HGB 11.9*  HCT 35.1*  PLT 148*    Chemistries   Recent Labs Lab 04/05/15 1120 04/05/15 1716  04/07/15 0449  NA 147*  --   < > 138  K 3.6  --   < > 4.7  CL 118*  --   < > 110  CO2 18*  --   < > 22  GLUCOSE 123*  --   < > 100*  BUN 53*  --   < > 39*  CREATININE 1.82*  --   < > 1.56*  CALCIUM 9.5  --   < > 8.5*  MG  --  1.9  --   --   AST 21  --   --   --   ALT 11*  --   --   --   ALKPHOS 98  --   --   --   BILITOT 0.5  --   --   --   < > = values in this interval not displayed.  Cardiac Enzymes  Recent Labs Lab 04/05/15 1120  TROPONINI <0.03    Microbiology Results  Results for orders placed or performed during the hospital encounter of 04/05/15  Blood culture (routine x 2)     Status: None (Preliminary result)   Collection Time: 04/05/15 11:20 AM  Result Value Ref Range Status   Specimen Description BLOOD RIGHT HAND  Final   Special Requests   Final    BOTTLES DRAWN AEROBIC AND ANAEROBIC  4 CC ANAERO 2CC AERO   Culture NO GROWTH 2 DAYS  Final   Report Status PENDING  Incomplete  Blood culture (routine x 2)     Status: None (Preliminary result)   Collection Time: 04/05/15 11:20 AM  Result Value Ref Range Status   Specimen  Description BLOOD RIGHT ASSIST CONTROL  Final   Special Requests   Final    BOTTLES DRAWN AEROBIC AND ANAEROBIC  1CC ANAERO [place]   Culture NO GROWTH 2 DAYS  Final   Report Status PENDING  Incomplete  Urine culture     Status: None   Collection Time: 04/05/15 12:26 PM  Result Value Ref Range Status   Specimen Description URINE, CLEAN CATCH  Final   Special Requests NONE  Final   Culture NO GROWTH 2 DAYS  Final   Report Status 04/07/2015 FINAL  Final    RADIOLOGY:  Dg Chest Portable 1 View  04/05/2015  CLINICAL DATA:  Found in rest home covered in feces with tachycardia EXAM: PORTABLE CHEST - 1 VIEW COMPARISON:  12/25/2014 FINDINGS: Cardiac shadow is prominent. A hiatal hernia is noted. The lungs are clear bilaterally. No acute bony abnormality is seen. IMPRESSION: No acute abnormality noted. Electronically Signed   By: Inez Catalina M.D.   On: 04/05/2015 12:16    Management plans discussed with the patient, and guardian and they are in agreement.  CODE STATUS:     Code Status Orders        Start     Ordered   04/05/15 1708  Full code   Continuous     04/05/15 1707      TOTAL TIME TAKING CARE OF THIS PATIENT: 35 minutes.    Loletha Grayer M.D on 04/07/2015 at 11:31 AM  Between 7am to 6pm - Pager - [phone]  After 6pm go to [email or web address] - password [email or web address] [facility]  Office  [phone]  CC: Primary care physician; [name], MD

## 2015-04-07 NOTE — Progress Notes (Signed)
Pt alert. Discharged to group home. Patient packet given to pt. IV sites removed. No order to remove foley, for chronic use, it remained intact and was emptied prior to transport. Nebulizer packet was given to pt. Pt belongings given. Concerns addressed.

## 2015-04-07 NOTE — Care Management Important Message (Signed)
Important Message  Patient Details  Name: Matthew Poole MRN: [id number] Date of Birth: 20-Dec-1940   Medicare Important Message Given:  Yes    [name] 04/07/2015, 10:05 AM

## 2015-04-07 NOTE — Progress Notes (Signed)
Refferal for DME placed with Will Anderson at [facility].

## 2015-04-07 NOTE — Progress Notes (Signed)
Pt alert. Resting in bed. No stool or emesis noted.   Spoke with pt guardian, [name], he mentioned that the pt was doing fine until the primary physician increased the pt Jevity intake per day from two cans to four cans. Upon increase, the guardian noticed episodes of vomiting and diarrhea.   Pt currently scheduled on four cans per day, no emesis noted. Pt has a non-productive cough. Swallows pills with no complications. Possibly the feedings at the group home may need to be spaced out more or adjusted in count as the gaurdian reported that the pt also eats by mouth as well.

## 2015-04-07 NOTE — Clinical Social Work Note (Signed)
Patient to discharge today to return to [facility]. Lawanda at [facility] called CSW and stated that she would be able to take patient back at discharge. Tommie Sams is aware of discharge today. Physician spoke with patient's emergency contact: JC and he stated he will transport patient back today. Shela Leff MSW,LcSW [phone]

## 2015-04-10 LAB — CULTURE, BLOOD (ROUTINE X 2)
CULTURE: NO GROWTH
Culture: NO GROWTH

## 2015-04-25 ENCOUNTER — Emergency Department: Payer: Commercial Managed Care - HMO

## 2015-04-25 ENCOUNTER — Encounter: Payer: Self-pay | Admitting: Intensive Care

## 2015-04-25 ENCOUNTER — Emergency Department
Admission: EM | Admit: 2015-04-25 | Discharge: 2015-04-25 | Disposition: A | Discharge status: Home / Self Care | Payer: Commercial Managed Care - HMO | Attending: Emergency Medicine | Admitting: Emergency Medicine

## 2015-04-25 DIAGNOSIS — F17210 Nicotine dependence, cigarettes, uncomplicated: Secondary | ICD-10-CM | POA: Diagnosis not present

## 2015-04-25 DIAGNOSIS — Z792 Long term (current) use of antibiotics: Secondary | ICD-10-CM | POA: Insufficient documentation

## 2015-04-25 DIAGNOSIS — N189 Chronic kidney disease, unspecified: Secondary | ICD-10-CM

## 2015-04-25 DIAGNOSIS — Z880 Allergy status to penicillin: Secondary | ICD-10-CM | POA: Insufficient documentation

## 2015-04-25 DIAGNOSIS — R21 Rash and other nonspecific skin eruption: Secondary | ICD-10-CM | POA: Diagnosis present

## 2015-04-25 DIAGNOSIS — Z79899 Other long term (current) drug therapy: Secondary | ICD-10-CM | POA: Diagnosis not present

## 2015-04-25 HISTORY — DX: Age-related osteoporosis without current pathological fracture: M81.0

## 2015-04-25 HISTORY — DX: Urinary tract infection, site not specified: N39.0

## 2015-04-25 HISTORY — DX: Disorder of kidney and ureter, unspecified: N28.9

## 2015-04-25 HISTORY — DX: Malignant neoplasm of unspecified kidney, except renal pelvis: C64.9

## 2015-04-25 HISTORY — DX: Sepsis, unspecified organism: A41.9

## 2015-04-25 HISTORY — DX: Depression, unspecified: F32.A

## 2015-04-25 HISTORY — DX: Anemia, unspecified: D64.9

## 2015-04-25 HISTORY — DX: Gastrostomy status: Z93.1

## 2015-04-25 HISTORY — DX: Major depressive disorder, single episode, unspecified: F32.9

## 2015-04-25 HISTORY — DX: Gastro-esophageal reflux disease without esophagitis: K21.9

## 2015-04-25 LAB — COMPREHENSIVE METABOLIC PANEL
ALBUMIN: 3.5 g/dL (ref 3.5–5.0)
ALK PHOS: 89 U/L (ref 38–126)
ALT: 11 U/L — ABNORMAL LOW (ref 17–63)
AST: 18 U/L (ref 15–41)
Anion gap: 7 (ref 5–15)
BILIRUBIN TOTAL: 0.6 mg/dL (ref 0.3–1.2)
BUN: 44 mg/dL — AB (ref 6–20)
CALCIUM: 8.9 mg/dL (ref 8.9–10.3)
CHLORIDE: 110 mmol/L (ref 101–111)
CO2: 24 mmol/L (ref 22–32)
CREATININE: 1.66 mg/dL — AB (ref 0.61–1.24)
GFR, EST AFRICAN AMERICAN: 46 mL/min — AB (ref >60)
GFR, EST NON AFRICAN AMERICAN: 40 mL/min — AB (ref >60)
Glucose, Bld: 104 mg/dL — ABNORMAL HIGH (ref 65–99)
Potassium: 4.1 mmol/L (ref 3.5–5.1)
SODIUM: 141 mmol/L (ref 135–145)
Total Protein: 6.3 g/dL — ABNORMAL LOW (ref 6.5–8.1)

## 2015-04-25 LAB — CBC
HEMATOCRIT: 37.3 % — AB (ref 40.0–52.0)
HEMOGLOBIN: 12.5 g/dL — AB (ref 13.0–18.0)
MCH: 32.2 pg (ref 26.0–34.0)
MCHC: 33.4 g/dL (ref 32.0–36.0)
MCV: 96.3 fL (ref 80.0–100.0)
Platelets: 157 10*3/uL (ref 150–440)
RBC: 3.87 MIL/uL — AB (ref 4.40–5.90)
RDW: 13.6 % (ref 11.5–14.5)
WBC: 10.5 10*3/uL (ref 3.8–10.6)

## 2015-04-25 LAB — LIPASE, BLOOD: LIPASE: 71 U/L — AB (ref 11–51)

## 2015-04-25 NOTE — ED Notes (Signed)
Patient arrived from nursing facility for suspected UTI. Patient has foley in place and a peg tube. Patient c/o pain on penis and around peg tube. EMS reports there is suspected scabies at nursing home. Staff called EMS and sent patient here for UTI

## 2015-04-25 NOTE — ED Notes (Signed)
Patient is still waiting on [facility] cab to get here to transport to [facility]. [employer] cab was called again and they relayed they would be here ASAP

## 2015-04-25 NOTE — ED Provider Notes (Signed)
[facility] Emergency Department Provider Note  ____________________________________________  Time seen: On arrival  I have reviewed the triage vital signs and the nursing notes.   HISTORY  Chief Complaint Recurrent UTI    HPI Matthew Poole is a 74 y.o. male who presents from nursing home for evaluation. They're concerned because he felt warm today but has not had a fever. He does have a chronic Foley catheter there were also concerned about a rash they think maybe related to a new medication he is taking. Patient denies cough. Denies abdominal pain. No shortness of breath. No myalgias. He reports he feels well   Past Medical History  Diagnosis Date  . Difficulty walking   . Muscle weakness   . Dysphagia, oropharyngeal   . Reflux   . Cancer (HCC)     renal cell carcinoma  . Mental retardation   . Psoriasis   . Sepsis secondary to UTI ([place])   . Renal disorder     acute on chronic renal disorder  . GERD (gastroesophageal reflux disease)   . Anemia   . Osteoporosis   . Depression   . G tube feedings ([place])   . Renal cell carcinoma ([place])   . Psoriasis     Patient Active Problem List   Diagnosis Date Noted  . Malnutrition of moderate degree 04/06/2015  . UTI (lower urinary tract infection) 04/05/2015  . ARF (acute renal failure) ([place]) 04/05/2015  . Sepsis secondary to UTI ([place]) 12/25/2014  . Sepsis ([place]) 12/25/2014    Past Surgical History  Procedure Laterality Date  . Peg placement      Current Outpatient Rx  Name  Route  Sig  Dispense  Refill  . acetaminophen (TYLENOL) 325 MG tablet   Oral   Take 325 mg by mouth every 4 (four) hours as needed for mild pain, fever or headache.          . albuterol (PROVENTIL) (2.5 MG/3ML) 0.083% nebulizer solution   Nebulization   Take 3 mLs (2.5 mg total) by nebulization every 6 (six) hours as needed for wheezing.   75 mL   12   . chlorhexidine (PERIDEX) 0.12 % solution   Mouth/Throat   Use  as directed 0.5 mLs in the mouth or throat 2 (two) times daily.          . cholecalciferol (VITAMIN D) 1000 UNITS tablet   Oral   Take 1,000 Units by mouth daily.         . clotrimazole (LOTRIMIN) 1 % cream   Topical   Apply 1 application topically 2 (two) times daily.         Marland Kitchen docusate (COLACE) 50 MG/5ML liquid   Oral   Take 50 mg by mouth 2 (two) times daily as needed for mild constipation.         Marland Kitchen doxazosin (CARDURA) 2 MG tablet   Oral   Take 2 mg by mouth daily.         . folic acid (FOLVITE) 1 MG tablet   Oral   Take 5 mg by mouth daily. Pt takes Monday-Saturday.         . levofloxacin (LEVAQUIN) 500 MG tablet   Oral   Take 1 tablet (500 mg total) by mouth daily.   8 tablet   0   . methotrexate (RHEUMATREX) 2.5 MG tablet   Oral   Take 5 mg by mouth once a week. Patient takes on Sunday.  Caution:Chemotherapy. Protect from light.         . Multiple Vitamins-Minerals (MULTIVITAMIN WITH IRON-MINERALS) liquid   Oral   Take 15 mLs by mouth daily.          . Nutritional Supplements (FEEDING SUPPLEMENT, JEVITY 1.5 CAL,) LIQD   Per Tube   Place 237 mLs into feeding tube 4 (four) times daily.         Marland Kitchen omeprazole (PRILOSEC) 20 MG capsule   Oral   Take 20 mg by mouth 2 (two) times daily.          . Probiotic Product (4X PROBIOTIC PO)   Oral   Take 1 capsule by mouth daily.          Marland Kitchen triamcinolone cream (KENALOG) 0.1 %   Topical   Apply 1 application topically 2 (two) times daily.         . vitamin C (ASCORBIC ACID) 250 MG tablet   Oral   Take 500 mg by mouth 2 (two) times daily.            Allergies Penicillins  Family History  Problem Relation Age of Onset  . Family history unknown: Yes    Social History Social History  Substance Use Topics  . Smoking status: Current Every Day Smoker    Types: Cigarettes  . Smokeless tobacco: Never Used  . Alcohol Use: No    Review of Systems  Constitutional: Negative for  fever. Eyes: Negative for visual changes. ENT: Negative for sore throat Cardiovascular: Negative for chest pain. Respiratory: Negative for shortness of breath. Negative for cough Gastrointestinal: Negative for abdominal pain, vomiting  Genitourinary: Positive for Foley catheter Musculoskeletal: Negative for injury Skin: Negative for rash. Neurological: Negative for headaches or focal weakness Psychiatric: No anxiety    ____________________________________________   PHYSICAL EXAM:  VITAL SIGNS: ED Triage Vitals  Enc Vitals Group     BP 04/25/15 0903 128/71 mmHg     Pulse Rate 04/25/15 0903 98     Resp 04/25/15 0903 30     Temp 04/25/15 0903 98.2 F (36.8 C)     Temp Source 04/25/15 0903 Oral     SpO2 04/25/15 0903 97 %     Weight 04/25/15 0903 161 lb (73.029 kg)     Height 04/25/15 0903 5\' 10"  (1.778 m)     Head Cir --      Peak Flow --      Pain Score --      Pain Loc --      Pain Edu? --      Excl. in [place]? --      Constitutional: Alert and oriented. Well appearing and in no distress. Eyes: Conjunctivae are normal.  ENT   Head: Normocephalic and atraumatic.   Mouth/Throat: Mucous membranes are moist. Cardiovascular: Normal rate, regular rhythm. Normal and symmetric distal pulses are present in all extremities. No murmurs, rubs, or gallops. Respiratory: Normal respiratory effort without tachypnea nor retractions. Breath sounds are clear and equal bilaterally.  Gastrointestinal: Soft and non-tender in all quadrants. No distention. There is no CVA tenderness. G-tube noted, no surrounding erythema Genitourinary: Foley catheter in place Musculoskeletal: Nontender with normal range of motion in all extremities. No lower extremity tenderness nor edema. Neurologic:  Normal speech and language. No gross focal neurologic deficits are appreciated. Skin:  Skin is warm, dry and intact. No rash noted. Psychiatric: Mood and affect are normal. Patient exhibits appropriate  insight and judgment.  ____________________________________________  LABS (pertinent positives/negatives)  Labs Reviewed  CBC - Abnormal; Notable for the following:    RBC 3.87 (*)    Hemoglobin 12.5 (*)    HCT 37.3 (*)    All other components within normal limits  COMPREHENSIVE METABOLIC PANEL - Abnormal; Notable for the following:    Glucose, Bld 104 (*)    BUN 44 (*)    Creatinine, Ser 1.66 (*)    Total Protein 6.3 (*)    ALT 11 (*)    GFR calc non Af Amer 40 (*)    GFR calc Af Amer 46 (*)    All other components within normal limits  LIPASE, BLOOD - Abnormal; Notable for the following:    Lipase 71 (*)    All other components within normal limits    ____________________________________________   EKG  None  ____________________________________________    RADIOLOGY I have personally reviewed any xrays that were ordered on this patient: Chest x-ray shows hiatal hernia  ____________________________________________   PROCEDURES  Procedure(s) performed: none  Critical Care performed: none  ____________________________________________   INITIAL IMPRESSION / ASSESSMENT AND PLAN / ED COURSE  Pertinent labs & imaging results that were available during my care of the patient were reviewed by me and considered in my medical decision making (see chart for details).  Patient overall well-appearing. He require bathing in the emergency department because of stool. His labs are unremarkable and he feels well overall. He is afebrile here, no tachycardia. Benign exam. I see no reason for admission to the hospital and he agrees. We will discharge him with outpatient PCP follow-up  ____________________________________________   FINAL CLINICAL IMPRESSION(S) / ED DIAGNOSES  Final diagnoses:  Chronic kidney disease, unspecified stage     Lavonia Drafts, MD 04/25/15 1347

## 2015-04-25 NOTE — Discharge Instructions (Signed)
Chronic Kidney Disease °Chronic kidney disease occurs when the kidneys are damaged over a long period. The kidneys are two organs that lie on either side of the spine between the middle of the back and the front of the abdomen. The kidneys: °· Remove wastes and extra water from the blood. °· Produce important hormones. These help keep bones strong, regulate blood pressure, and help create red blood cells. °· Balance the fluids and chemicals in the blood and tissues. °A small amount of kidney damage may not cause problems, but a large amount of damage may make it difficult or impossible for the kidneys to work the way they should. If steps are not taken to slow down the kidney damage or stop it from getting worse, the kidneys may stop working permanently. Most of the time, chronic kidney disease does not go away. However, it can often be controlled, and those with the disease can usually live normal lives. °CAUSES °The most common causes of chronic kidney disease are diabetes and high blood pressure (hypertension). Chronic kidney disease may also be caused by: °· Diseases that cause the kidneys' filters to become inflamed. °· Diseases that affect the immune system. °· Genetic diseases. °· Medicines that damage the kidneys, such as anti-inflammatory medicines. °· Poisoning or exposure to toxic substances. °· A reoccurring kidney or urinary infection. °· A problem with urine flow. This may be caused by: °¨ Cancer. °¨ Kidney stones. °¨ An enlarged prostate in males. °SIGNS AND SYMPTOMS °Because the kidney damage in chronic kidney disease occurs slowly, symptoms develop slowly and may not be obvious until the kidney damage becomes severe. A person may have a kidney disease for years without showing any symptoms. Symptoms can include: °· Swelling (edema) of the legs, ankles, or feet. °· Tiredness (lethargy). °· Nausea or vomiting. °· Confusion. °· Problems with urination, such as: °¨ Decreased urine  production. °¨ Frequent urination, especially at night. °¨ Frequent accidents in children who are potty trained. °· Muscle twitches and cramps. °· Shortness of breath. °· Weakness. °· Persistent itchiness. °· Loss of appetite. °· Metallic taste in the mouth. °· Trouble sleeping. °· Slowed development in children. °· Short stature in children. °DIAGNOSIS °Chronic kidney disease may be detected and diagnosed by tests, including blood, urine, imaging, or kidney biopsy tests. °TREATMENT °Most chronic kidney diseases cannot be cured. Treatment usually involves relieving symptoms and preventing or slowing the progression of the disease. Treatment may include: °· A special diet. You may need to avoid alcohol and foods that are salty and high in potassium. °· Medicines. These may: °¨ Lower blood pressure. °¨ Relieve anemia. °¨ Relieve swelling. °¨ Protect the bones. °HOME CARE INSTRUCTIONS °· Follow your prescribed diet.  Your health care provider may instruct you to limit daily salt (sodium) and protein intake. °· Take medicines only as directed by your health care provider. Do not take any new medicines (prescription, over-the-counter, or nutritional supplements) unless approved by your health care provider. Many medicines can worsen your kidney damage or need to have the dose adjusted.   °· Quit smoking if you smoke. Talk to your health care provider about a smoking cessation program. °· Keep all follow-up visits as directed by your health care provider. °· Monitor your blood pressure. °· Start or continue an exercise plan. °· Get immunizations as directed by your health care provider. °· Take vitamin and mineral supplements as directed by your health care provider. °SEEK IMMEDIATE MEDICAL CARE IF: °· Your symptoms get worse or you develop   new symptoms. °· You develop symptoms of end-stage kidney disease. These include: °¨ Headaches. °¨ Abnormally dark or light skin. °¨ Numbness in the hands or feet. °¨ Easy  bruising. °¨ Frequent hiccups. °¨ Menstruation stops. °· You have a fever. °· You have decreased urine production. °· You have pain or bleeding when urinating. °MAKE SURE YOU: °· Understand these instructions. °· Will watch your condition. °· Will get help right away if you are not doing well or get worse. °FOR MORE INFORMATION  °· American Association of Kidney Patients: www.aakp.org °· National Kidney Foundation: www.kidney.org °· American Kidney Fund: www.akfinc.org °· Life [employer] Rehabilitation Program: www.lifeoptions.org and www.kidneyschool.org °  °This information is not intended to replace advice given to you by your health care provider. Make sure you discuss any questions you have with your health care provider. °  °Document Released: 02/08/2008 Document Revised: 05/22/2014 Document Reviewed: 12/29/2011 °Elsevier Interactive Patient Education ©2016 Elsevier Inc. ° °

## 2015-04-25 NOTE — ED Notes (Signed)
Patient was brought in by EMS and cleaned/showered with RN's help in [place]

## 2015-05-03 NOTE — Progress Notes (Signed)
This encounter was created in error - please disregard.

## 2015-05-04 ENCOUNTER — Other Ambulatory Visit: Payer: Self-pay

## 2015-05-04 NOTE — Patient Outreach (Addendum)
[place] [facility]) Care Management  05/04/2015  IZEA BRENSINGER [birth year] [id number]   Telephone Screen  Referral Date: 05/03/15 Referral Source: [facility] tier 4 list Referral Reason: "diagnosis is not listed with 2 ED visits and 2 admissions"   Outreach attempt #1 to patient. Someone picked up and would not say anything then hung up. RN CM dialed number back and no answer.    Plan: RN CM will attempt outreach call to patient within a week.  Enzo Montgomery, RN,BSN,CCM [place] Management Telephonic Care Management Coordinator Direct Phone: [phone] Toll Free: [phone] Fax: [phone]

## 2015-05-06 ENCOUNTER — Other Ambulatory Visit: Payer: Self-pay

## 2015-05-06 NOTE — Patient Outreach (Signed)
[place] [facility]) Care Management  05/06/2015  Matthew Poole [birth year] [id number]   Telephone Screen  Referral Date: 05/03/15 Referral Source: [facility] tier 4 list Referral Reason: "diagnosis is not listed with 2 ED visits and 2 admissions"  Outreach attempt # 2 to patient. Called number listed for patient [phone]). Male answered at stated name of residence was [facility] [place]. She reported that they did not have a resident there by the patient's name. Contacted emergency contact-[name] and he provided RN CM with new number for patient([phone]). Contacted and reached patient at this number. He gave consent to speak with caregiver there as well as [name] if needed. Caregiver voices that patient is unable to care for himself. They provide 24 hr care to patient at facility([facility]). Caregiver states that they assist patient with all ADLs/IADLs. Patient has a feeding tube. She voices that patient's condition continues to decline but they are able to meet all his care needs at this time. Denies any issues or concerns for CM.   Plan: RN CM will notify [facility] administrative assistant of case closure. RN CM will send MD case closure letter.  Enzo Montgomery, RN,BSN,CCM [place] Management Telephonic Care Management Coordinator Direct Phone: [phone] Toll Free: [phone] Fax: [phone]

## 2015-05-19 ENCOUNTER — Emergency Department
Admission: EM | Admit: 2015-05-19 | Discharge: 2015-05-19 | Disposition: A | Discharge status: Home / Self Care | Payer: PPO | Attending: Emergency Medicine | Admitting: Emergency Medicine

## 2015-05-19 ENCOUNTER — Emergency Department: Payer: PPO

## 2015-05-19 ENCOUNTER — Encounter: Payer: Self-pay | Admitting: Emergency Medicine

## 2015-05-19 DIAGNOSIS — Z431 Encounter for attention to gastrostomy: Secondary | ICD-10-CM | POA: Diagnosis not present

## 2015-05-19 DIAGNOSIS — F17210 Nicotine dependence, cigarettes, uncomplicated: Secondary | ICD-10-CM | POA: Insufficient documentation

## 2015-05-19 DIAGNOSIS — Z880 Allergy status to penicillin: Secondary | ICD-10-CM | POA: Diagnosis not present

## 2015-05-19 DIAGNOSIS — Z79899 Other long term (current) drug therapy: Secondary | ICD-10-CM | POA: Diagnosis not present

## 2015-05-19 DIAGNOSIS — Z792 Long term (current) use of antibiotics: Secondary | ICD-10-CM | POA: Diagnosis not present

## 2015-05-19 DIAGNOSIS — Z4659 Encounter for fitting and adjustment of other gastrointestinal appliance and device: Secondary | ICD-10-CM

## 2015-05-19 MED ORDER — DIATRIZOATE MEGLUMINE & SODIUM 66-10 % PO SOLN: 30.0000 mL | Freq: Once | ORAL | Status: DC

## 2015-05-19 NOTE — ED Provider Notes (Addendum)
[facility] Emergency Department Provider Note  Time seen: 1:28 PM  I have reviewed the triage vital signs and the nursing notes.   HISTORY  Chief Complaint Other    HPI Matthew Poole is a 75 y.o. male with a past medical history of dysphagia, cancer, MR, gastric reflux, G-tube who presents to the emergency department after she developed. According to his caregiver the patient had his G-tube in for his feedings/medications at 7:30 PM last night. This morning the G-tube had fallen out. It is not clear how long the G-tube was out for. Patient is in no distress, has no complaints. Caregiver states he has not been complaining of any abdominal pain or any other complaints.     Past Medical History  Diagnosis Date  . Difficulty walking   . Muscle weakness   . Dysphagia, oropharyngeal   . Reflux   . Cancer (HCC)     renal cell carcinoma  . Mental retardation   . Psoriasis   . Sepsis secondary to UTI ([place])   . Renal disorder     acute on chronic renal disorder  . GERD (gastroesophageal reflux disease)   . Anemia   . Osteoporosis   . Depression   . G tube feedings ([place])   . Renal cell carcinoma ([place])   . Psoriasis     Patient Active Problem List   Diagnosis Date Noted  . Malnutrition of moderate degree 04/06/2015  . UTI (lower urinary tract infection) 04/05/2015  . ARF (acute renal failure) ([place]) 04/05/2015  . Sepsis secondary to UTI ([place]) 12/25/2014  . Sepsis ([place]) 12/25/2014    Past Surgical History  Procedure Laterality Date  . Peg placement      Current Outpatient Rx  Name  Route  Sig  Dispense  Refill  . acetaminophen (TYLENOL) 325 MG tablet   Oral   Take 325 mg by mouth every 4 (four) hours as needed for mild pain, fever or headache.          . albuterol (PROVENTIL) (2.5 MG/3ML) 0.083% nebulizer solution   Nebulization   Take 3 mLs (2.5 mg total) by nebulization every 6 (six) hours as needed for wheezing.   75 mL   12   .  chlorhexidine (PERIDEX) 0.12 % solution   Mouth/Throat   Use as directed 0.5 mLs in the mouth or throat 2 (two) times daily.          . cholecalciferol (VITAMIN D) 1000 UNITS tablet   Oral   Take 1,000 Units by mouth daily.         . clotrimazole (LOTRIMIN) 1 % cream   Topical   Apply 1 application topically 2 (two) times daily.         Marland Kitchen docusate (COLACE) 50 MG/5ML liquid   Oral   Take 50 mg by mouth 2 (two) times daily as needed for mild constipation.         Marland Kitchen doxazosin (CARDURA) 2 MG tablet   Oral   Take 2 mg by mouth daily.         . folic acid (FOLVITE) 1 MG tablet   Oral   Take 5 mg by mouth daily. Pt takes Monday-Saturday.         . levofloxacin (LEVAQUIN) 500 MG tablet   Oral   Take 1 tablet (500 mg total) by mouth daily.   8 tablet   0   . methotrexate (RHEUMATREX) 2.5 MG tablet   Oral  Take 5 mg by mouth once a week. Patient takes on Sunday.    Caution:Chemotherapy. Protect from light.         . Multiple Vitamins-Minerals (MULTIVITAMIN WITH IRON-MINERALS) liquid   Oral   Take 15 mLs by mouth daily.          . Nutritional Supplements (FEEDING SUPPLEMENT, JEVITY 1.5 CAL,) LIQD   Per Tube   Place 237 mLs into feeding tube 4 (four) times daily.         Marland Kitchen omeprazole (PRILOSEC) 20 MG capsule   Oral   Take 20 mg by mouth 2 (two) times daily.          . Probiotic Product (4X PROBIOTIC PO)   Oral   Take 1 capsule by mouth daily.          Marland Kitchen triamcinolone cream (KENALOG) 0.1 %   Topical   Apply 1 application topically 2 (two) times daily.         . vitamin C (ASCORBIC ACID) 250 MG tablet   Oral   Take 500 mg by mouth 2 (two) times daily.            Allergies Penicillins  Family History  Problem Relation Age of Onset  . Family history unknown: Yes    Social History Social History  Substance Use Topics  . Smoking status: Current Every Day Smoker    Types: Cigarettes  . Smokeless tobacco: Never Used  . Alcohol Use: No     Review of Systems Constitutional: Negative for fever. Cardiovascular: Negative for chest pain. Respiratory: Negative for shortness of breath. Gastrointestinal: Negative for abdominal pain Musculoskeletal: Negative for back pain. Neurological: Negative for headache 10-point ROS otherwise negative, although somewhat limited by the patient's history of mental retardation.  ____________________________________________   PHYSICAL EXAM:  VITAL SIGNS: ED Triage Vitals  Enc Vitals Group     BP 05/19/15 1032 114/55 mmHg     Pulse Rate 05/19/15 1032 91     Resp 05/19/15 1032 18     Temp 05/19/15 1032 98.2 F (36.8 C)     Temp Source 05/19/15 1032 Oral     SpO2 05/19/15 1032 94 %     Weight 05/19/15 1032 161 lb (73.029 kg)     Height 05/19/15 1032 5\' 10"  (1.778 m)     Head Cir --      Peak Flow --      Pain Score --      Pain Loc --      Pain Edu? --      Excl. in [place]? --     Constitutional: Alert.  Well appearing and in no distress. Eyes: Normal exam ENT   Head: Normocephalic and atraumatic.   Mouth/Throat: Mucous membranes are moist. Cardiovascular: Normal rate, regular rhythm. No murmu Respiratory: Normal respiratory effort without tachypnea nor retractions. Breath sounds are clear and equal bilaterally. No wheezes/rales/rhonchi. Gastrointestinal:Soft, G-tube insertion site present, no signs of infection. No tenderness to palpation.  Musculoskeletal: Nontender with normal range of motion in all extremities.  Neurologic: No gross focal neurologic deficits  Skin:  Skin is warm, dry and intact.  Psychiatric: Mood and affect are normal.   ____________________________________________   RADIOLOGY  X-ray shows successful placement of G-tube.   INITIAL IMPRESSION / ASSESSMENT AND PLAN / ED COURSE  Pertinent labs & imaging results that were available during my care of the patient were reviewed by me and considered in my medical decision making (see chart for  details).  Patient presents after G-tube dislodgment. G-tube replaced by myself without complication, 16 [place]. We will discharge the patient home. Patient has no complaints status post G-tube insertion. X-ray confirms placement.    ____________________________________________   FINAL CLINICAL IMPRESSION(S) / ED DIAGNOSES  G-tube replacement   Harvest Dark, MD 05/19/15 1445  Harvest Dark, MD 05/19/15 1445

## 2015-05-19 NOTE — ED Notes (Signed)
Pt pulled out g tube ? What time--could have been duri;ng night, but they nnoticed it at 8am.  Pt has existing foley.  Pt in nad.  Transferred to bed without difficulty.  Caregiver with pt.

## 2015-05-19 NOTE — ED Notes (Signed)
Pt presents with home health RN with c/o pt pulling G Tube out this am around 0830. Pt due to have tube changed this mth. Pt normally seen in [place].

## 2015-06-06 ENCOUNTER — Emergency Department
Admission: EM | Admit: 2015-06-06 | Discharge: 2015-06-06 | Disposition: A | Discharge status: Home / Self Care | Payer: PPO | Attending: Emergency Medicine | Admitting: Emergency Medicine

## 2015-06-06 ENCOUNTER — Encounter: Payer: Self-pay | Admitting: Emergency Medicine

## 2015-06-06 DIAGNOSIS — Z79899 Other long term (current) drug therapy: Secondary | ICD-10-CM | POA: Diagnosis not present

## 2015-06-06 DIAGNOSIS — K9429 Other complications of gastrostomy: Secondary | ICD-10-CM | POA: Diagnosis not present

## 2015-06-06 DIAGNOSIS — Z880 Allergy status to penicillin: Secondary | ICD-10-CM | POA: Insufficient documentation

## 2015-06-06 DIAGNOSIS — F17210 Nicotine dependence, cigarettes, uncomplicated: Secondary | ICD-10-CM | POA: Diagnosis not present

## 2015-06-06 DIAGNOSIS — Z431 Encounter for attention to gastrostomy: Secondary | ICD-10-CM

## 2015-06-06 DIAGNOSIS — T85528A Displacement of other gastrointestinal prosthetic devices, implants and grafts, initial encounter: Secondary | ICD-10-CM

## 2015-06-06 NOTE — ED Provider Notes (Signed)
[facility] Emergency Department Provider Note  ____________________________________________  Time seen: 1250  I have reviewed the triage vital signs and the nursing notes.  History by:  Caretaker from group home  HISTORY  Chief Complaint Displaced G-tube    HPI Matthew Poole Age is a 75 y.o. male who has had a long indwelling G-tube in place. The provider who is giving the history reports it was well established when she first started working with him over 6 months ago. He has had a number of occasions where he has inadvertently pulled the G-tube out, including recently on January 4. Since that replacement, he has been seen at [facility] and the G-tube that was placed in the emergency department at [facility] was changed from a 55 [place] to an 50 [place] due to some leakage.  This morning, the staff at the group home found the patient with the G-tube in hand at 8 AM. It is not clear when it was actually told out.  There is no significant bleeding and the patient does not appear to be uncomfortable. They present for replacement of the G-tube.    Past Medical History  Diagnosis Date  . Difficulty walking   . Muscle weakness   . Dysphagia, oropharyngeal   . Reflux   . Cancer (HCC)     renal cell carcinoma  . Mental retardation   . Psoriasis   . Sepsis secondary to UTI ([place])   . Renal disorder     acute on chronic renal disorder  . GERD (gastroesophageal reflux disease)   . Anemia   . Osteoporosis   . Depression   . G tube feedings ([place])   . Renal cell carcinoma ([place])   . Psoriasis     Patient Active Problem List   Diagnosis Date Noted  . Malnutrition of moderate degree 04/06/2015  . UTI (lower urinary tract infection) 04/05/2015  . ARF (acute renal failure) ([place]) 04/05/2015  . Sepsis secondary to UTI ([place]) 12/25/2014  . Sepsis ([place]) 12/25/2014    Past Surgical History  Procedure Laterality Date  . Peg placement      Current Outpatient  Rx  Name  Route  Sig  Dispense  Refill  . acetaminophen (TYLENOL) 325 MG tablet   Oral   Take 325 mg by mouth every 4 (four) hours as needed for mild pain, fever or headache.          . albuterol (PROVENTIL) (2.5 MG/3ML) 0.083% nebulizer solution   Nebulization   Take 3 mLs (2.5 mg total) by nebulization every 6 (six) hours as needed for wheezing.   75 mL   12   . chlorhexidine (PERIDEX) 0.12 % solution   Mouth/Throat   Use as directed 0.5 mLs in the mouth or throat 2 (two) times daily.          . cholecalciferol (VITAMIN D) 1000 UNITS tablet   Oral   Take 1,000 Units by mouth daily.         . clotrimazole (LOTRIMIN) 1 % cream   Topical   Apply 1 application topically 2 (two) times daily.         Marland Kitchen docusate (COLACE) 50 MG/5ML liquid   Oral   Take 50 mg by mouth 2 (two) times daily as needed for mild constipation.         Marland Kitchen doxazosin (CARDURA) 2 MG tablet   Oral   Take 2 mg by mouth daily.         Marland Kitchen  folic acid (FOLVITE) 1 MG tablet   Oral   Take 5 mg by mouth daily. Pt takes Monday-Saturday.         . levofloxacin (LEVAQUIN) 500 MG tablet   Oral   Take 1 tablet (500 mg total) by mouth daily.   8 tablet   0   . methotrexate (RHEUMATREX) 2.5 MG tablet   Oral   Take 5 mg by mouth once a week. Patient takes on Sunday.    Caution:Chemotherapy. Protect from light.         . Multiple Vitamins-Minerals (MULTIVITAMIN WITH IRON-MINERALS) liquid   Oral   Take 15 mLs by mouth daily.          . Nutritional Supplements (FEEDING SUPPLEMENT, JEVITY 1.5 CAL,) LIQD   Per Tube   Place 237 mLs into feeding tube 4 (four) times daily.         Marland Kitchen omeprazole (PRILOSEC) 20 MG capsule   Oral   Take 20 mg by mouth 2 (two) times daily.          . Probiotic Product (4X PROBIOTIC PO)   Oral   Take 1 capsule by mouth daily.          Marland Kitchen triamcinolone cream (KENALOG) 0.1 %   Topical   Apply 1 application topically 2 (two) times daily.         . vitamin C  (ASCORBIC ACID) 250 MG tablet   Oral   Take 500 mg by mouth 2 (two) times daily.            Allergies Penicillins  Family History  Problem Relation Age of Onset  . Family history unknown: Yes    Social History Social History  Substance Use Topics  . Smoking status: Current Every Day Smoker -- 0.50 packs/day    Types: Cigarettes  . Smokeless tobacco: Never Used  . Alcohol Use: No    Review of Systems Review of systems. Limited due to the patient's limited communication. Constitutional: Negative for fever/chills. ENT: Negative for congestion. Cardiovascular: Negative for chest pain. Respiratory: Negative for cough. Gastrointestinal: Negative for abdominal pain, vomiting and diarrhea. Notable for G-tube displacement. See history of present illness Genitourinary: Foley catheter in place.  10-point ROS otherwise negative.  ____________________________________________   PHYSICAL EXAM:  VITAL SIGNS: ED Triage Vitals  Enc Vitals Group     BP 06/06/15 1136 109/59 mmHg     Pulse Rate 06/06/15 1136 83     Resp 06/06/15 1136 18     Temp 06/06/15 1136 98.2 F (36.8 C)     Temp Source 06/06/15 1136 Oral     SpO2 06/06/15 1136 98 %     Weight 06/06/15 1136 161 lb (73.029 kg)     Height 06/06/15 1136 5\' 9"  (1.753 m)     Head Cir --      Peak Flow --      Pain Score --      Pain Loc --      Pain Edu? --      Excl. in [place]? --     Constitutional: Alert, no distress. ENT   Head: Normocephalic and atraumatic.   Nose: No congestion/rhinnorhea.  Cardiovascular: Normal rate, regular rhythm, no murmur noted Respiratory:  Normal respiratory effort, no tachypnea.    Breath sounds are clear and equal bilaterally.  Gastrointestinal: Soft, no distention. Nontender. There is a stoma in the left upper abdomen consistent with the history of a long-standing G-tube placement. Musculoskeletal: No deformity  noted. Nontender with normal range of motion in all extremities.  No  noted edema.   LABS (pertinent positives/negatives)    ____________________________________________   EKG    ____________________________________________    RADIOLOGY    ____________________________________________   PROCEDURES  G-tube replacement: We obtained both an 79 [place] and a 75 [place] G-tube in preparation for placement. With the patient lying supine with his knees up and his head back, we cleaned the area with normal saline and then applied lubricant and inserted an 18 [place] G-tube. There was appropriate resistance initially. Time was taken and wait patiently waited until the tissue released and the tube passed through the stoma. Gastric contents returned in the tube.  ____________________________________________   INITIAL IMPRESSION / ASSESSMENT AND PLAN / ED COURSE  Pertinent labs & imaging results that were available during my care of the patient were reviewed by me and considered in my medical decision making (see chart for details).  G-tube replaced. Uneventful placement. With gastric contents showing in the tube and a well-established track, I do not see any indication for radiologic confirmation. We will discharge the patient to the group home.  ____________________________________________   FINAL CLINICAL IMPRESSION(S) / ED DIAGNOSES  Final diagnoses:  Attention to G-tube [facility])  Dislodged gastrostomy tube (HCC)      Ahmed Prima, MD 06/06/15 1330

## 2015-06-06 NOTE — ED Notes (Signed)
[facility]. Per pt's nurse pt was found to have pulled out his G-Tube sometime during the night, was found with his G-Tube in his hand. Per Isaias Sakai, RN, pt has hx of MR and Dementia and a hx of pulling out his G-Tube. Pt was seen at [facility] on 06/03/2015 and they replaced his G-Tube due to it leaking. Pt has a 50F g-tube.

## 2015-06-06 NOTE — Discharge Instructions (Signed)
Gastrostomy Tube Replacement, Care After °Refer to this sheet in the next few weeks. These instructions provide you with information on caring for yourself after your procedure. Your health care provider may also give you more specific instructions. Your treatment has been planned according to current medical practices, but problems sometimes occur. Call your health care provider if you have any problems or questions after your procedure. °WHAT TO EXPECT AFTER THE PROCEDURE  °After your procedure, it is typical to have the following:  °· Mild abdominal pain. °· A small amount of blood-tinged fluid leaking from the replacement site. °HOME CARE INSTRUCTIONS °· You may resume your normal level of activity. °· You may resume your normal feedings. °· Care for your gastrostomy tube as you did before, or as directed by your health care provider. °SEEK MEDICAL CARE IF: °· You have a fever or chills.  °· You have redness or irritation near the insertion site. °· You continue to have abdominal pain or leaking around your gastrostomy tube. °SEEK IMMEDIATE MEDICAL CARE IF:  °· You develop bleeding or significant discharge around the tube. °· You have severe abdominal pain.  °· Your new tube does not seem to be working properly. °· You are unable to get feedings into the tube. °· Your tube comes out for any reason.   °  °This information is not intended to replace advice given to you by your health care provider. Make sure you discuss any questions you have with your health care provider. °  °Document Released: 11/26/2013 Document Reviewed: 11/26/2013 °Elsevier Interactive Patient Education ©2016 [employer] ° °

## 2015-06-10 ENCOUNTER — Encounter: Payer: Self-pay | Admitting: Podiatry

## 2015-06-10 ENCOUNTER — Ambulatory Visit (INDEPENDENT_AMBULATORY_CARE_PROVIDER_SITE_OTHER): Payer: PPO | Admitting: Podiatry

## 2015-06-10 ENCOUNTER — Ambulatory Visit: Payer: Self-pay

## 2015-06-10 DIAGNOSIS — B351 Tinea unguium: Secondary | ICD-10-CM

## 2015-06-10 DIAGNOSIS — M79676 Pain in unspecified toe(s): Secondary | ICD-10-CM

## 2015-06-10 NOTE — Progress Notes (Signed)
Patient ID: Matthew Poole, male   DOB: 01/09/1941, 75 y.o.   MRN: [id number]  Subjective: 75 y.o.-year-old male returns the office today for painful, elongated, thickened toenails which he is unable to trim himself. Denies any redness or drainage around the nails. Denies any acute changes since last appointment and no new complaints today. Denies any systemic complaints such as fevers, chills, nausea, vomiting.   Objective: AAO 3, NAD DP/PT pulses palpable, CRT less than 3 seconds Protective sensation intact with Simms Weinstein monofilament, Achilles tendon reflex intact.  Nails hypertrophic, dystrophic, elongated, brittle, discolored 10. There is tenderness overlying the nails 1-5 bilaterally. There is no surrounding erythema or drainage along the nail sites. No open lesions or pre-ulcerative lesions are identified bilaterally No other areas of apparent tenderness bilateral lower extremities. No overlying edema, erythema, increased warmth. HAV and hammertoes are present No pain with calf compression, swelling, warmth, erythema.  Assessment: Patient presents with symptomatic onychomycosis  Plan: -Treatment options including alternatives, risks, complications were discussed -Nails sharply debrided 10 without complication/bleeding. -Discussed daily foot inspection. If there are any changes, to call the office immediately.  -Follow-up in 3 months or sooner if any problems are to arise. In the meantime, encouraged to call the office with any questions, concerns, changes symptoms. 3 mo.  Celesta Gentile, DPM

## 2015-09-09 ENCOUNTER — Ambulatory Visit: Payer: Self-pay | Admitting: Podiatry

## 2015-10-07 ENCOUNTER — Ambulatory Visit (INDEPENDENT_AMBULATORY_CARE_PROVIDER_SITE_OTHER): Payer: PPO | Admitting: Podiatry

## 2015-10-07 ENCOUNTER — Encounter: Payer: Self-pay | Admitting: Podiatry

## 2015-10-07 DIAGNOSIS — M79676 Pain in unspecified toe(s): Secondary | ICD-10-CM

## 2015-10-07 DIAGNOSIS — L84 Corns and callosities: Secondary | ICD-10-CM

## 2015-10-07 DIAGNOSIS — B351 Tinea unguium: Secondary | ICD-10-CM | POA: Diagnosis not present

## 2015-10-07 NOTE — Progress Notes (Signed)
Patient ID: Matthew Poole, male   DOB: June 27, 1940, 75 y.o.   MRN: [id number]  Subjective: 75 y.o.-year-old male returns the office today for painful, elongated, thickened toenails which he is unable to trim himself. Denies any redness or drainage around the nails. Denies any acute changes since last appointment and no new complaints today. Denies any systemic complaints such as fevers, chills, nausea, vomiting.   Objective: AAO 3, NAD DP/PT pulses palpable, CRT less than 3 seconds Protective sensation intact with Simms Weinstein monofilament, Achilles tendon reflex intact.  Nails hypertrophic, dystrophic, elongated, brittle, discolored 10. There is tenderness overlying the nails 1-5 bilaterally. There is no surrounding erythema or drainage along the nail sites. Medial hallux IPJ hyperkerotic lesions. No underlying ulceration, drainage or other signs of infection. No open lesions or pre-ulcerative lesions are identified bilaterally No other areas of apparent tenderness bilateral lower extremities. No overlying edema, erythema, increased warmth. HAV and hammertoes are present No pain with calf compression, swelling, warmth, erythema.  Assessment: Patient presents with symptomatic onychomycosis, hyperkeratotic lesions  Plan: -Treatment options including alternatives, risks, complications were discussed -Nails sharply debrided 10 without complication/bleeding. -Hyperkeratotic lesion debrided 2 without complications or bleeding. -Discussed daily foot inspection. If there are any changes, to call the office immediately.  -Follow-up in 3 months or sooner if any problems are to arise. In the meantime, encouraged to call the office with any questions, concerns, changes symptoms. 3 mo.  Celesta Gentile, DPM

## 2016-01-04 ENCOUNTER — Ambulatory Visit (INDEPENDENT_AMBULATORY_CARE_PROVIDER_SITE_OTHER): Payer: PPO | Admitting: Podiatry

## 2016-01-04 ENCOUNTER — Encounter: Payer: Self-pay | Admitting: Podiatry

## 2016-01-04 DIAGNOSIS — L84 Corns and callosities: Secondary | ICD-10-CM | POA: Diagnosis not present

## 2016-01-04 DIAGNOSIS — M79676 Pain in unspecified toe(s): Secondary | ICD-10-CM

## 2016-01-04 DIAGNOSIS — B351 Tinea unguium: Secondary | ICD-10-CM | POA: Diagnosis not present

## 2016-01-04 NOTE — Progress Notes (Signed)
Patient ID: Matthew Poole, male   DOB: [birth year], 75 y.o.   MRN: [id number]  Subjective: 75 y.o.-year-old male returns the office today for painful, elongated, thickened toenails which he is unable to trim himself. Denies any redness or drainage around the nails. Denies any acute changes since last appointment and no new complaints today. Denies any systemic complaints such as fevers, chills, nausea, vomiting.   Objective: AAO 3, NAD DP/PT pulses palpable, CRT less than 3 seconds Nails hypertrophic, dystrophic, elongated, brittle, discolored 10. There is tenderness overlying the nails 1-5 bilaterally. There is no surrounding erythema or drainage along the nail sites. Medial hallux IPJ hyperkerotic lesions. No underlying ulceration, drainage or other signs of infection. No open lesions or pre-ulcerative lesions are identified bilaterally. Chronic eczema bilaterally on dorsal feet.  No other areas of apparent tenderness bilateral lower extremities. No overlying edema, erythema, increased warmth. HAV and hammertoes are present No pain with calf compression, swelling, warmth, erythema.  Assessment: Patient presents with symptomatic onychomycosis, hyperkeratotic lesions  Plan: -Treatment options including alternatives, risks, complications were discussed -Nails sharply debrided 10 without complication/bleeding. -Hyperkeratotic lesion debrided 2 without complications or bleeding. -Discussed daily foot inspection. If there are any changes, to call the office immediately.  -Follow-up in 3 months or sooner if any problems are to arise. In the meantime, encouraged to call the office with any questions, concerns, changes symptoms. 3 mo.  Celesta Gentile, DPM

## 2016-01-31 ENCOUNTER — Encounter: Payer: Self-pay | Admitting: Emergency Medicine

## 2016-01-31 ENCOUNTER — Emergency Department: Payer: PPO

## 2016-01-31 ENCOUNTER — Inpatient Hospital Stay
Admission: EM | Admit: 2016-01-31 | Discharge: 2016-02-08 | DRG: 871 | Disposition: A | Discharge status: Hospice | Payer: PPO | Attending: Internal Medicine | Admitting: Internal Medicine

## 2016-01-31 DIAGNOSIS — R739 Hyperglycemia, unspecified: Secondary | ICD-10-CM | POA: Diagnosis present

## 2016-01-31 DIAGNOSIS — Z880 Allergy status to penicillin: Secondary | ICD-10-CM

## 2016-01-31 DIAGNOSIS — R062 Wheezing: Secondary | ICD-10-CM | POA: Diagnosis not present

## 2016-01-31 DIAGNOSIS — R627 Adult failure to thrive: Secondary | ICD-10-CM | POA: Diagnosis present

## 2016-01-31 DIAGNOSIS — F17210 Nicotine dependence, cigarettes, uncomplicated: Secondary | ICD-10-CM | POA: Diagnosis present

## 2016-01-31 DIAGNOSIS — I5031 Acute diastolic (congestive) heart failure: Secondary | ICD-10-CM | POA: Diagnosis not present

## 2016-01-31 DIAGNOSIS — C679 Malignant neoplasm of bladder, unspecified: Secondary | ICD-10-CM | POA: Diagnosis present

## 2016-01-31 DIAGNOSIS — Z79899 Other long term (current) drug therapy: Secondary | ICD-10-CM

## 2016-01-31 DIAGNOSIS — R34 Anuria and oliguria: Secondary | ICD-10-CM | POA: Diagnosis present

## 2016-01-31 DIAGNOSIS — R0602 Shortness of breath: Secondary | ICD-10-CM | POA: Diagnosis present

## 2016-01-31 DIAGNOSIS — J9600 Acute respiratory failure, unspecified whether with hypoxia or hypercapnia: Secondary | ICD-10-CM | POA: Diagnosis present

## 2016-01-31 DIAGNOSIS — D62 Acute posthemorrhagic anemia: Secondary | ICD-10-CM | POA: Diagnosis present

## 2016-01-31 DIAGNOSIS — Z85528 Personal history of other malignant neoplasm of kidney: Secondary | ICD-10-CM

## 2016-01-31 DIAGNOSIS — I5043 Acute on chronic combined systolic (congestive) and diastolic (congestive) heart failure: Secondary | ICD-10-CM | POA: Diagnosis present

## 2016-01-31 DIAGNOSIS — R1900 Intra-abdominal and pelvic swelling, mass and lump, unspecified site: Secondary | ICD-10-CM | POA: Diagnosis present

## 2016-01-31 DIAGNOSIS — J449 Chronic obstructive pulmonary disease, unspecified: Secondary | ICD-10-CM | POA: Diagnosis present

## 2016-01-31 DIAGNOSIS — I429 Cardiomyopathy, unspecified: Secondary | ICD-10-CM | POA: Diagnosis present

## 2016-01-31 DIAGNOSIS — C642 Malignant neoplasm of left kidney, except renal pelvis: Secondary | ICD-10-CM | POA: Diagnosis not present

## 2016-01-31 DIAGNOSIS — Z8551 Personal history of malignant neoplasm of bladder: Secondary | ICD-10-CM | POA: Diagnosis not present

## 2016-01-31 DIAGNOSIS — Z66 Do not resuscitate: Secondary | ICD-10-CM | POA: Diagnosis not present

## 2016-01-31 DIAGNOSIS — R1310 Dysphagia, unspecified: Secondary | ICD-10-CM | POA: Diagnosis present

## 2016-01-31 DIAGNOSIS — B9562 Methicillin resistant Staphylococcus aureus infection as the cause of diseases classified elsewhere: Secondary | ICD-10-CM | POA: Diagnosis present

## 2016-01-31 DIAGNOSIS — N170 Acute kidney failure with tubular necrosis: Secondary | ICD-10-CM | POA: Diagnosis present

## 2016-01-31 DIAGNOSIS — N185 Chronic kidney disease, stage 5: Secondary | ICD-10-CM | POA: Diagnosis present

## 2016-01-31 DIAGNOSIS — R0600 Dyspnea, unspecified: Secondary | ICD-10-CM | POA: Diagnosis not present

## 2016-01-31 DIAGNOSIS — B9620 Unspecified Escherichia coli [E. coli] as the cause of diseases classified elsewhere: Secondary | ICD-10-CM | POA: Diagnosis present

## 2016-01-31 DIAGNOSIS — N19 Unspecified kidney failure: Secondary | ICD-10-CM | POA: Diagnosis not present

## 2016-01-31 DIAGNOSIS — I509 Heart failure, unspecified: Secondary | ICD-10-CM

## 2016-01-31 DIAGNOSIS — L409 Psoriasis, unspecified: Secondary | ICD-10-CM | POA: Diagnosis present

## 2016-01-31 DIAGNOSIS — Z9289 Personal history of other medical treatment: Secondary | ICD-10-CM

## 2016-01-31 DIAGNOSIS — R778 Other specified abnormalities of plasma proteins: Secondary | ICD-10-CM | POA: Diagnosis present

## 2016-01-31 DIAGNOSIS — A419 Sepsis, unspecified organism: Principal | ICD-10-CM | POA: Diagnosis present

## 2016-01-31 DIAGNOSIS — J9601 Acute respiratory failure with hypoxia: Secondary | ICD-10-CM | POA: Diagnosis present

## 2016-01-31 DIAGNOSIS — Z452 Encounter for adjustment and management of vascular access device: Secondary | ICD-10-CM

## 2016-01-31 DIAGNOSIS — M810 Age-related osteoporosis without current pathological fracture: Secondary | ICD-10-CM | POA: Diagnosis present

## 2016-01-31 DIAGNOSIS — Z515 Encounter for palliative care: Secondary | ICD-10-CM

## 2016-01-31 DIAGNOSIS — R310 Gross hematuria: Secondary | ICD-10-CM | POA: Diagnosis present

## 2016-01-31 DIAGNOSIS — I5042 Chronic combined systolic (congestive) and diastolic (congestive) heart failure: Secondary | ICD-10-CM

## 2016-01-31 DIAGNOSIS — N179 Acute kidney failure, unspecified: Secondary | ICD-10-CM | POA: Diagnosis not present

## 2016-01-31 DIAGNOSIS — N390 Urinary tract infection, site not specified: Secondary | ICD-10-CM | POA: Diagnosis present

## 2016-01-31 DIAGNOSIS — Z905 Acquired absence of kidney: Secondary | ICD-10-CM

## 2016-01-31 DIAGNOSIS — F79 Unspecified intellectual disabilities: Secondary | ICD-10-CM | POA: Diagnosis present

## 2016-01-31 DIAGNOSIS — D500 Iron deficiency anemia secondary to blood loss (chronic): Secondary | ICD-10-CM | POA: Diagnosis present

## 2016-01-31 DIAGNOSIS — K219 Gastro-esophageal reflux disease without esophagitis: Secondary | ICD-10-CM | POA: Diagnosis present

## 2016-01-31 DIAGNOSIS — Z931 Gastrostomy status: Secondary | ICD-10-CM

## 2016-01-31 DIAGNOSIS — R319 Hematuria, unspecified: Secondary | ICD-10-CM

## 2016-01-31 DIAGNOSIS — R609 Edema, unspecified: Secondary | ICD-10-CM | POA: Diagnosis not present

## 2016-01-31 HISTORY — DX: Chronic obstructive pulmonary disease, unspecified: J44.9

## 2016-01-31 LAB — CBC WITH DIFFERENTIAL/PLATELET
Basophils Absolute: 0.1 10*3/uL (ref 0–0.1)
Basophils Relative: 1 %
EOS PCT: 1 %
Eosinophils Absolute: 0.1 10*3/uL (ref 0–0.7)
HCT: 24.2 % — ABNORMAL LOW (ref 40.0–52.0)
Hemoglobin: 7.7 g/dL — ABNORMAL LOW (ref 13.0–18.0)
LYMPHS PCT: 11 %
Lymphs Abs: 1.2 10*3/uL (ref 1.0–3.6)
MCH: 28.3 pg (ref 26.0–34.0)
MCHC: 31.8 g/dL — ABNORMAL LOW (ref 32.0–36.0)
MCV: 89.2 fL (ref 80.0–100.0)
MONO ABS: 0.6 10*3/uL (ref 0.2–1.0)
Monocytes Relative: 5 %
Neutro Abs: 9.1 10*3/uL — ABNORMAL HIGH (ref 1.4–6.5)
Neutrophils Relative %: 82 %
PLATELETS: 263 10*3/uL (ref 150–440)
RBC: 2.71 MIL/uL — ABNORMAL LOW (ref 4.40–5.90)
RDW: 14.8 % — AB (ref 11.5–14.5)
WBC: 11 10*3/uL — ABNORMAL HIGH (ref 3.8–10.6)

## 2016-01-31 LAB — URINALYSIS COMPLETE WITH MICROSCOPIC (ARMC ONLY)
Bilirubin Urine: NEGATIVE
GLUCOSE, UA: NEGATIVE mg/dL
Ketones, ur: NEGATIVE mg/dL
Nitrite: NEGATIVE
PROTEIN: 100 mg/dL — AB
Specific Gravity, Urine: 1.017 (ref 1.005–1.030)
pH: 6 (ref 5.0–8.0)

## 2016-01-31 LAB — BLOOD GAS, ARTERIAL
ALLENS TEST (PASS/FAIL): POSITIVE — AB
Acid-base deficit: 3.4 mmol/L — ABNORMAL HIGH (ref 0.0–2.0)
BICARBONATE: 20.1 mmol/L (ref 20.0–28.0)
Delivery systems: POSITIVE
EXPIRATORY PAP: 5
FIO2: 1
INSPIRATORY PAP: 12
O2 Saturation: 99.9 %
Patient temperature: 37
pCO2 arterial: 31 mmHg — ABNORMAL LOW (ref 32.0–48.0)
pH, Arterial: 7.42 (ref 7.350–7.450)
pO2, Arterial: 338 mmHg — ABNORMAL HIGH (ref 83.0–108.0)

## 2016-01-31 LAB — APTT: aPTT: 42 seconds — ABNORMAL HIGH (ref 24–36)

## 2016-01-31 LAB — COMPREHENSIVE METABOLIC PANEL
ALT: 11 U/L — ABNORMAL LOW (ref 17–63)
ANION GAP: 12 (ref 5–15)
AST: 24 U/L (ref 15–41)
Albumin: 2.5 g/dL — ABNORMAL LOW (ref 3.5–5.0)
Alkaline Phosphatase: 99 U/L (ref 38–126)
BUN: 70 mg/dL — ABNORMAL HIGH (ref 6–20)
CHLORIDE: 105 mmol/L (ref 101–111)
CO2: 21 mmol/L — ABNORMAL LOW (ref 22–32)
Calcium: 8 mg/dL — ABNORMAL LOW (ref 8.9–10.3)
Creatinine, Ser: 4.63 mg/dL — ABNORMAL HIGH (ref 0.61–1.24)
GFR, EST AFRICAN AMERICAN: 13 mL/min — AB (ref >60)
GFR, EST NON AFRICAN AMERICAN: 11 mL/min — AB (ref >60)
Glucose, Bld: 293 mg/dL — ABNORMAL HIGH (ref 65–99)
POTASSIUM: 4.5 mmol/L (ref 3.5–5.1)
Sodium: 138 mmol/L (ref 135–145)
Total Bilirubin: 0.5 mg/dL (ref 0.3–1.2)
Total Protein: 6.4 g/dL — ABNORMAL LOW (ref 6.5–8.1)

## 2016-01-31 LAB — PROTIME-INR
INR: 1.34
PROTHROMBIN TIME: 16.7 s — AB (ref 11.4–15.2)

## 2016-01-31 LAB — LACTIC ACID, PLASMA: LACTIC ACID, VENOUS: 2.7 mmol/L — AB (ref 0.5–1.9)

## 2016-01-31 LAB — TROPONIN I: TROPONIN I: 1.46 ng/mL — AB (ref <0.03)

## 2016-01-31 MED ORDER — SODIUM CHLORIDE 0.9 % IV SOLN: 10.0000 mL/h | Freq: Once | INTRAVENOUS | Status: DC

## 2016-01-31 MED ORDER — IPRATROPIUM-ALBUTEROL 0.5-2.5 (3) MG/3ML IN SOLN
3.0000 mL | Freq: Once | RESPIRATORY_TRACT | Status: AC
  Administered 2016-01-31: 3 mL via RESPIRATORY_TRACT
  Filled 2016-01-31: qty 3

## 2016-01-31 MED ORDER — SODIUM CHLORIDE 0.9 % IV SOLN: 250.0000 mL | INTRAVENOUS | Status: DC | PRN

## 2016-01-31 MED ORDER — VANCOMYCIN HCL IN DEXTROSE 1-5 GM/200ML-% IV SOLN
1000.0000 mg | Freq: Once | INTRAVENOUS | Status: AC
  Administered 2016-01-31: 1000 mg via INTRAVENOUS
  Filled 2016-01-31: qty 200

## 2016-01-31 MED ORDER — DEXTROSE 5 % IV SOLN
2.0000 g | INTRAVENOUS | Status: AC
  Administered 2016-01-31: 2 g via INTRAVENOUS
  Filled 2016-01-31 (×2): qty 2

## 2016-01-31 MED ORDER — SODIUM CHLORIDE 0.9 % IV BOLUS (SEPSIS): 1000.0000 mL | Freq: Once | INTRAVENOUS | Status: DC

## 2016-01-31 MED ORDER — SODIUM CHLORIDE 0.9 % IV BOLUS (SEPSIS)
1000.0000 mL | Freq: Once | INTRAVENOUS | Status: AC
  Administered 2016-01-31: 1000 mL via INTRAVENOUS

## 2016-01-31 NOTE — ED Notes (Addendum)
Pt's bedding and hospital goun  were change after the Pt had a BM.

## 2016-01-31 NOTE — ED Provider Notes (Signed)
[facility] Emergency Department Provider Note   ____________________________________________   First MD Initiated Contact with Patient 01/31/16 1953     (approximate)  I have reviewed the triage vital signs and the nursing notes.   HISTORY  Chief Complaint Shortness of Breath  Caveat-history of present illness and review of systems is limited due to the patient's poor understanding of why he is here.  information is obtained partly from him as well as from staff at his family care home.  HPI Matthew Poole is a 75 y.o. male with history of COPD, renal cell carcinoma status post multiple ablations scheduled to undergo cystourethroscopy and ureteroscopy, possible tumor fulguration, and possible left ureteral stent exchange with Dr. Elyse Hsu on 02/07/2016, prophylactically on Bactrim and Cipro at this time who presents for evaluation from his living facility for shortness of breath this evening. According to staff at his family care home, he became acutely short of breath this evening, he did not have improvement with use of his nebulizer. On EMS arrival, he received a DuoNeb as well as 125 mg of Solu-Medrol. He is not had any fevers, vomiting or diarrhea. He has had persistent hematuria from his indwelling Foley catheter.   Past Medical History:  Diagnosis Date  . Anemia   . Cancer (HCC)    renal cell carcinoma  . COPD (chronic obstructive pulmonary disease) ([place])   . Depression   . Difficulty walking   . Dysphagia, oropharyngeal   . G tube feedings ([place])   . GERD (gastroesophageal reflux disease)   . Mental retardation   . Muscle weakness   . Osteoporosis   . Psoriasis   . Psoriasis   . Reflux   . Renal cell carcinoma ([place])   . Renal disorder    acute on chronic renal disorder  . Sepsis secondary to UTI [facility])     Patient Active Problem List   Diagnosis Date Noted  . Acute respiratory failure ([place]) 01/31/2016  . Malnutrition of moderate  degree 04/06/2015  . UTI (lower urinary tract infection) 04/05/2015  . ARF (acute renal failure) ([place]) 04/05/2015  . Sepsis secondary to UTI ([place]) 12/25/2014  . Sepsis ([place]) 12/25/2014    Past Surgical History:  Procedure Laterality Date  . PEG PLACEMENT      Prior to Admission medications   Medication Sig Start Date End Date Taking? Authorizing Provider  acetaminophen (TYLENOL) 325 MG tablet Take 325 mg by mouth every 4 (four) hours as needed for mild pain, fever or headache.    Yes Historical Provider, MD  albuterol (PROVENTIL) (2.5 MG/3ML) 0.083% nebulizer solution Take 3 mLs (2.5 mg total) by nebulization every 6 (six) hours as needed for wheezing. 04/07/15  Yes Richard Leslye Peer, MD  chlorhexidine (PERIDEX) 0.12 % solution Use as directed 1 mL in the mouth or throat 2 (two) times daily.    Yes Historical Provider, MD  cholecalciferol (VITAMIN D) 1000 UNITS tablet Take 1,000 Units by mouth daily.   Yes Historical Provider, MD  ciprofloxacin (CIPRO) 250 MG tablet Take 250 mg by mouth 2 (two) times daily. For 10 days 01/24/16  Yes Historical Provider, MD  docusate sodium (COLACE) 100 MG capsule Take 100 mg by mouth 2 (two) times daily.   Yes Historical Provider, MD  doxazosin (CARDURA) 2 MG tablet Take 2 mg by mouth daily.   Yes Historical Provider, MD  ferrous sulfate 325 (65 FE) MG tablet Take 325 mg by mouth daily with breakfast.  Yes Historical Provider, MD  loperamide (IMODIUM) 2 MG capsule Take 2 mg by mouth 4 (four) times daily as needed for diarrhea or loose stools.   Yes Historical Provider, MD  Multiple Vitamins-Minerals (MULTIVITAMIN WITH IRON-MINERALS) liquid Take 5 mLs by mouth daily.    Yes Historical Provider, MD  Nutritional Supplements (FEEDING SUPPLEMENT, JEVITY 1.5 CAL,) LIQD Place 237 mLs into feeding tube 4 (four) times daily.   Yes Historical Provider, MD  omeprazole (PRILOSEC) 20 MG capsule Take 20 mg by mouth 2 (two) times daily.    Yes Historical Provider, MD    oxybutynin (DITROPAN) 5 MG tablet Take 5 mg by mouth 3 (three) times daily as needed for bladder spasms.   Yes Historical Provider, MD  Probiotic Product (4X PROBIOTIC PO) Take 1 capsule by mouth daily.    Yes Historical Provider, MD  senna (SENOKOT) 8.6 MG TABS tablet Place 2 tablets into feeding tube daily.   Yes Historical Provider, MD  sertraline (ZOLOFT) 50 MG tablet Take 50 mg by mouth daily.   Yes Historical Provider, MD  sodium bicarbonate 325 MG tablet Take 975 mg by mouth 3 (three) times daily.   Yes Historical Provider, MD  sulfamethoxazole-trimethoprim (BACTRIM,SEPTRA) 400-80 MG tablet Take 1 tablet by mouth 2 (two) times daily. For 10 days 01/26/16  Yes Historical Provider, MD  triamcinolone cream (KENALOG) 0.1 % Apply 1 application topically 2 (two) times daily.   Yes Historical Provider, MD  vitamin C (ASCORBIC ACID) 250 MG tablet Take 500 mg by mouth 2 (two) times daily.    Yes Historical Provider, MD    Allergies Penicillins  Family History  Problem Relation Age of Onset  . Family history unknown: Yes    Social History Social History  Substance Use Topics  . Smoking status: Current Every Day Smoker    Packs/day: 0.50    Types: Cigarettes  . Smokeless tobacco: Never Used  . Alcohol use No    Review of Systems Constitutional: No fever/chills Eyes: No visual changes. ENT: No sore throat. Cardiovascular: Denies chest pain. Respiratory: + shortness of breath. Gastrointestinal: No abdominal pain.  No nausea, no vomiting.  No diarrhea.  No constipation. Genitourinary: Negative for dysuria. Musculoskeletal: Negative for back pain. Skin: Negative for rash. Neurological: Negative for headaches, focal weakness or numbness.  10-point ROS otherwise negative.  ____________________________________________   PHYSICAL EXAM:  VITAL SIGNS: ED Triage Vitals  Enc Vitals Group     BP 01/31/16 1948 (!) 115/59     Pulse Rate 01/31/16 1948 (!) 113     Resp 01/31/16 1948  (!) 26     Temp 01/31/16 1948 98.4 F (36.9 C)     Temp Source 01/31/16 1948 Oral     SpO2 01/31/16 1948 97 %     Weight 01/31/16 1949 160 lb (72.6 kg)     Height 01/31/16 1949 5\' 6"  (1.676 m)     Head Circumference --      Peak Flow --      Pain Score 01/31/16 1949 0     Pain Loc --      Pain Edu? --      Excl. in [place]? --     Constitutional: Alert and oriented. In moderate respiratory distress. Eyes: Conjunctivae are normal. PERRL. EOMI. Head: Atraumatic. Nose: No congestion/rhinnorhea. Mouth/Throat: Mucous membranes are moist.  Oropharynx non-erythematous. Neck: No stridor.   Cardiovascular: Tachycardic rate, regular rhythm. Grossly normal heart sounds.  Good peripheral circulation. Respiratory: Increased work of breathing with  poor air movement, diffuse expiratory wheeze. Gastrointestinal: Soft and nontender. No distention.  No CVA tenderness. Genitourinary: Foley catheter in place draining bloody urine Rectal: Clear mucous in the rectal vault is guaiac negative Musculoskeletal: No lower extremity tenderness nor edema.  No joint effusions. Neurologic:  Normal speech and language. No gross focal neurologic deficits are appreciated.  Skin:  Skin is warm, dry and intact. No rash noted. Psychiatric: Mood and affect are normal. Speech and behavior are normal.  ____________________________________________   LABS (all labs ordered are listed, but only abnormal results are displayed)  Labs Reviewed  CBC WITH DIFFERENTIAL/PLATELET - Abnormal; Notable for the following:       Result Value   WBC 11.0 (*)    RBC 2.71 (*)    Hemoglobin 7.7 (*)    HCT 24.2 (*)    MCHC 31.8 (*)    RDW 14.8 (*)    Neutro Abs 9.1 (*)    All other components within normal limits  COMPREHENSIVE METABOLIC PANEL - Abnormal; Notable for the following:    CO2 21 (*)    Glucose, Bld 293 (*)    BUN 70 (*)    Creatinine, Ser 4.63 (*)    Calcium 8.0 (*)    Total Protein 6.4 (*)    Albumin 2.5 (*)    ALT  11 (*)    GFR calc non Af Amer 11 (*)    GFR calc Af Amer 13 (*)    All other components within normal limits  TROPONIN I - Abnormal; Notable for the following:    Troponin I 1.46 (*)    All other components within normal limits  LACTIC ACID, PLASMA - Abnormal; Notable for the following:    Lactic Acid, Venous 2.7 (*)    All other components within normal limits  URINALYSIS COMPLETEWITH MICROSCOPIC ([facility] ONLY) - Abnormal; Notable for the following:    Color, Urine RED (*)    APPearance TURBID (*)    Hgb urine dipstick 3+ (*)    Protein, ur 100 (*)    Leukocytes, UA 3+ (*)    Bacteria, UA RARE (*)    Squamous Epithelial / LPF 0-5 (*)    All other components within normal limits  PROTIME-INR - Abnormal; Notable for the following:    Prothrombin Time 16.7 (*)    All other components within normal limits  APTT - Abnormal; Notable for the following:    aPTT 42 (*)    All other components within normal limits  BLOOD GAS, ARTERIAL - Abnormal; Notable for the following:    pCO2 arterial 31 (*)    pO2, Arterial 338 (*)    Acid-base deficit 3.4 (*)    Allens test (pass/fail) POSITIVE (*)    All other components within normal limits  CULTURE, BLOOD (ROUTINE X 2)  CULTURE, BLOOD (ROUTINE X 2)  URINE CULTURE  LACTIC ACID, PLASMA  CBC  BASIC METABOLIC PANEL  BLOOD GAS, ARTERIAL  MAGNESIUM  PHOSPHORUS  PROCALCITONIN  PROCALCITONIN  TROPONIN I  TROPONIN I  TROPONIN I  TYPE AND SCREEN  PREPARE RBC (CROSSMATCH)  TYPE AND SCREEN   ____________________________________________  EKG  ED ECG REPORT I, Joanne Gavel, the attending physician, personally viewed and interpreted this ECG.   Date: 01/31/2016  EKG Time: 19:45  Rate: 115  Rhythm: sinus tachycardia  Axis: normal  Intervals:RBBB  ST&T Change: ST depression in V4, V5, V6. No acute ST elevation MI. QTC slightly prolonged at 508 ms.  ____________________________________________  RADIOLOGY  CXR IMPRESSION:  Cardiac  enlargement with pulmonary vascular congestion, diffuse  bilateral pulmonary edema, and small pleural effusions.       ____________________________________________   PROCEDURES  Procedure(s) performed: None  Procedures  Critical Care performed: Yes, see critical care note(s)    CRITICAL CARE Performed by: Loura Pardon A   Total critical care time: 35 minutes  Critical care time was exclusive of separately billable procedures and treating other patients.  Critical care was necessary to treat or prevent imminent or life-threatening deterioration.  Critical care was time spent personally by me on the following activities: development of treatment plan with patient and/or surrogate as well as nursing, discussions with consultants, evaluation of patient's response to treatment, examination of patient, obtaining history from patient or surrogate, ordering and performing treatments and interventions, ordering and review of laboratory studies, ordering and review of radiographic studies, pulse oximetry and re-evaluation of patient's condition.  ____________________________________________   INITIAL IMPRESSION / ASSESSMENT AND PLAN / ED COURSE  Pertinent labs & imaging results that were available during my care of the patient were reviewed by me and considered in my medical decision making (see chart for details).  Matthew Poole is a 75 y.o. male with history of COPD, renal cell carcinoma status post multiple ablations scheduled to undergo cystourethroscopy and ureteroscopy, possible tumor fulguration, and possible left ureteral stent exchange with Dr. Elyse Hsu on 02/07/2016, prophylactically on Bactrim and Cipro at this time who presents for evaluation from his living facility for shortness of breath this evening. On exam he does appear short of breath, he is tachypneic with increased work of breathing, poor air movement, diffuse respiratory wheezes concerning for COPD  exacerbation. We'll continue duo nebs, transitioned to BiPAP as needed for respiratory support. Concern also for sepsis given the fact that he is being treated with antibiotics due to concern for infection, we'll obtain screening labs, give Vanc and aztreonam and anticipate admission.  ----------------------------------------- 10:33 PM on 01/31/2016 ----------------------------------------- BiPAP initiated due to the patient's increased work of breathing and his work of breathing appears significantly improved at this time. He is sitting up in bed, resting comfortably. I discussed his EKG findings with Dr. Tresa Endo, on-call for cardiology who agrees no STEMI but ST depression concerning for ischemia, recommends treatment of his underlying acute medical issues such as severe anemia patient is been consented for infusion of. Her CBCs. I suspect he is bleeding from the Foley catheter, his caregivers report that this is chronic. He has brown stool in the rectal vault which is guaiac negative, not consistent with acute GI bleed. We will not anticoagulate him secondary to his ongoing bleeding and low hemoglobin asdiscussed with Dr. Lovena Le. His troponin is elevated at 1.4. Additionally, he has a solitary kidney with elevated creatinine at 4.6 and standing for acute renal failure. However, will not give any additional IV fluids as I suspect he will benefit more so from receiving blood and I do not want to volume overload him any further since he already has interstitial edema on chest x-ray. Case discussed with Dr. Stevenson Clinch of the ICU as well as  nurse practitioner [place] and the patient will be admitted.   Clinical Course     ____________________________________________   FINAL CLINICAL IMPRESSION(S) / ED DIAGNOSES  Final diagnoses:  SOB (shortness of breath)  Acute renal failure, unspecified acute renal failure type ([place])      NEW MEDICATIONS STARTED DURING THIS VISIT:  New Prescriptions   No  medications on file     Note:  This document was prepared using Dragon voice recognition software and may include unintentional dictation errors.    Joanne Gavel, MD 02/01/16 [phone]

## 2016-01-31 NOTE — ED Notes (Signed)
Mallie Mussel RN called the pharmacy to inquire about the Pt's antibiotic, they stated that they will send it soon.

## 2016-01-31 NOTE — ED Triage Notes (Signed)
Pt arrived to the ED via EMS from [facility] for complaints of SOB. EMS reports that the Pt's O2 saturations were on the low 80's. EMS gave 1 albuterol treatment, 125 Solumedrol and a 22G IV catheter on the left hand. Pt is currently being treated for a UTI and has kidney problems. Pt is AOx4 in no apparent distress upon arrival to the hospital with an O2 sat of 97% at 2lpm [place].

## 2016-01-31 NOTE — H&P (Signed)
PULMONARY / CRITICAL CARE MEDICINE   Name: Matthew Poole MRN: [id number] DOB: July 15, 1940    ADMISSION DATE:  01/31/2016 CONSULTATION DATE:  01/31/16  REFERRING MD:  Dr. Edd Fabian  CHIEF COMPLAINT:  Acute shortness of breath  HISTORY OF PRESENT ILLNESS:   Matthew Poole is a 75 years old male with known history of renal cell carcinoma s/p multiple ablations, COPD, GERD, mental retardation and dysphagia- indwelling G-tube.. Patient presented to the ED via EMS due to shortness of breath. His oxygen sats were down to 80s. EMS gave albuterol X1, Solu-Medrol-125MG . Patient is scheduled to undergo cystouretroscopy a and possible tumor fulguration, and left possible urethral stent exchange on 9/25 therefore patient was on bactrim and ciprofloxacin pro phylactically. Patient was persistently  bleeding from his indwelling foley.  Marland KitchenUpon arrival to the ED patient is tachycardic, tachypneic, Cr-4.63, troponin I-1.46. CXR was concerning for pulmonary edema and major cardiomegaly. Patient was placed on BiPAP and PC CM team was called to admit the patient. Patient is unable to provide relevant history. Tried to call the family member but I was unable to obtain any relevant history from the patient's contact person.  PAST MEDICAL HISTORY :  He  has a past medical history of Anemia; Cancer [facility]); COPD (chronic obstructive pulmonary disease) ([place]); Depression; Difficulty walking; Dysphagia, oropharyngeal; G tube feedings ([facility]); GERD (gastroesophageal reflux disease); Mental retardation; Muscle weakness; Osteoporosis; Psoriasis; Psoriasis; Reflux; Renal cell carcinoma ([place]); Renal disorder; and Sepsis secondary to UTI ([place]).  PAST SURGICAL HISTORY: He  has a past surgical history that includes PEG placement.  Allergies  Allergen Reactions  . Penicillins Itching, Nausea And Vomiting, Rash and Other (See Comments)    Unable to obtain enough information to answer additional questions about this medication.       No current facility-administered medications on file prior to encounter.    Current Outpatient Prescriptions on File Prior to Encounter  Medication Sig  . acetaminophen (TYLENOL) 325 MG tablet Take 325 mg by mouth every 4 (four) hours as needed for mild pain, fever or headache.   . albuterol (PROVENTIL) (2.5 MG/3ML) 0.083% nebulizer solution Take 3 mLs (2.5 mg total) by nebulization every 6 (six) hours as needed for wheezing.  . chlorhexidine (PERIDEX) 0.12 % solution Use as directed 1 mL in the mouth or throat 2 (two) times daily.   . cholecalciferol (VITAMIN D) 1000 UNITS tablet Take 1,000 Units by mouth daily.  Marland Kitchen doxazosin (CARDURA) 2 MG tablet Take 2 mg by mouth daily.  . Multiple Vitamins-Minerals (MULTIVITAMIN WITH IRON-MINERALS) liquid Take 5 mLs by mouth daily.   . Nutritional Supplements (FEEDING SUPPLEMENT, JEVITY 1.5 CAL,) LIQD Place 237 mLs into feeding tube 4 (four) times daily.  Marland Kitchen omeprazole (PRILOSEC) 20 MG capsule Take 20 mg by mouth 2 (two) times daily.   . Probiotic Product (4X PROBIOTIC PO) Take 1 capsule by mouth daily.   Marland Kitchen triamcinolone cream (KENALOG) 0.1 % Apply 1 application topically 2 (two) times daily.  . vitamin C (ASCORBIC ACID) 250 MG tablet Take 500 mg by mouth 2 (two) times daily.     FAMILY HISTORY:  His has no family status information on file.    SOCIAL HISTORY: He  reports that he has been smoking Cigarettes.  He has been smoking about 0.50 packs per day. He has never used smokeless tobacco. He reports that he does not drink alcohol or use drugs.  REVIEW OF SYSTEMS:   Review of Systems  Constitutional: Negative for chills and weight  loss.  HENT: Negative for congestion, ear discharge and tinnitus.   Eyes: Negative for double vision and photophobia.  Respiratory: Positive for shortness of breath.   Cardiovascular: Negative for palpitations and orthopnea.  Gastrointestinal: Negative for abdominal pain, constipation and vomiting.  Genitourinary:  Positive for hematuria.  Musculoskeletal: Negative for back pain, joint pain and neck pain.  Skin: Negative for itching and rash.  Neurological: Negative for sensory change, speech change and focal weakness.  Endo/Heme/Allergies: Negative for environmental allergies.  Psychiatric/Behavioral: Negative for hallucinations, substance abuse and suicidal ideas.     SUBJECTIVE:  Unable to obtain as the patient is on BiPAP  VITAL SIGNS: BP 120/72   Pulse (!) 107   Temp 98.4 F (36.9 C) (Oral)   Resp (!) 28   Ht 5\' 6"  (1.676 m)   Wt 72.6 kg (160 lb)   SpO2 98%   BMI 25.82 kg/m   HEMODYNAMICS:    VENTILATOR SETTINGS: FiO2 (%):  [100 %] 100 %  INTAKE / OUTPUT: No intake/output data recorded.  PHYSICAL EXAMINATION: General:  Elderly, white male, appears in some distress Neuro:  Awake, oriented X2, no focal deficits HEENT: Atraumatic, normocephalic, no JVD Cardiovascular:  S1, S2, tachycardic, regular, no MRG noted Lungs: Diminished bibasilar, mild exp wheezes, no rhonchi noted Abdomen: Soft,nontender, Musculoskeletal:  No inflammation/deformity noted Skin:  Grossly intact  LABS:  BMET  Recent Labs Lab 01/31/16 1956  NA 138  K 4.5  CL 105  CO2 21*  BUN 70*  CREATININE 4.63*  GLUCOSE 293*    Electrolytes  Recent Labs Lab 01/31/16 1956  CALCIUM 8.0*    CBC  Recent Labs Lab 01/31/16 1956  WBC 11.0*  HGB 7.7*  HCT 24.2*  PLT 263    Coag's  Recent Labs Lab 01/31/16 1956  APTT 42*  INR 1.34    Sepsis Markers  Recent Labs Lab 01/31/16 1958  LATICACIDVEN 2.7*    ABG  Recent Labs Lab 01/31/16 2210  PHART 7.42  PCO2ART 31*  PO2ART 338*    Liver Enzymes  Recent Labs Lab 01/31/16 1956  AST 24  ALT 11*  ALKPHOS 99  BILITOT 0.5  ALBUMIN 2.5*    Cardiac Enzymes  Recent Labs Lab 01/31/16 1956  TROPONINI 1.46*    Glucose No results for input(s): GLUCAP in the last 168 hours.  Imaging Dg Chest Port 1 View  Result  Date: 01/31/2016 CLINICAL DATA:  Shortness of breath and decreased oxygen saturation on oxygen. Current treatment for urinary tract infection. Smoker. EXAM: PORTABLE CHEST 1 VIEW COMPARISON:  04/25/2015 FINDINGS: Cardiac enlargement with diffuse pulmonary vascular congestion. Bilateral perihilar airspace and diffuse interstitial disease consistent with edema. Bilateral pneumonia also possible but felt less likely due vascular congestion and cardiac enlargement. Small bilateral pleural effusions. No pneumothorax. IMPRESSION: Cardiac enlargement with pulmonary vascular congestion, diffuse bilateral pulmonary edema, and small pleural effusions. Electronically Signed   By: Lucienne Capers M.D.   On: 01/31/2016 21:16     STUDIES:  none  CULTURES: 9/18 blood culture>> 9/18 urine culture>>  ANTIBIOTICS: 9/18 Aztreonam>> 9/18 vancomycin>>  SIGNIFICANT EVENTS: 56/83>> 75 year old male with renal carcinoma admitted to the ICU on BiPAP due to pulmonary edema/?sepsis related to UTI LINES/TUBES: none DISCUSSION: 75 year old male with known history of renal carcinoma presents with acute shortness of breath possibly related to pulmonary edema,bilateral pleural effusion and major cardiomegaly. a Patient is actively bleeding from foley catheter. Patient is placed on BiPAP. Seems comfortable now, but is at high risk for intubation.  ASSESSMENT / PLAN:  PULMONARY A: Acute respiratory failure possibly related to pulmonary edema Hx of COPD P:   Continue to support with oxygen to keep sats greater than 94% Patient is at high risk for intubation  DuoNeb PRN and scheduled Methylprednisone CXR in a.m.  CARDIOVASCULAR A:  Cardiomegaly Elevated troponins ST depression in V4, V5, V6.   P:  Trend troponin Patient is not a candidate for heparin GTT when discussed with cardiology as per the ER physician(Dr.Gayle)  RENAL A:   Acute on chronic renal disorder Renal cell carcinoma  Chronic  Hematuria  P:   Nephrology consulted Replace electrolytes per ICU protocol BMP, phos,mg in am Avoid nephrotoxic drugs  GASTROINTESTINAL A:   Indwelling G-tube History of GERD P:   Nothing by mouth for now famotidine  HEMATOLOGIC A:   Acute blood loss anemia P:  teds and SCD's Transfuse per ICU protocol Trend H/H  INFECTIOUS A:   Sepsis possibly related to UTI, UA with pyuria Leukocytosis P:   Trend lactic acid Trend pro-calcitonin Monitor fever curve Vancomycin/aztreonam Follow cultures  ENDOCRINE A:   Hyperglycemia P:   Blood sugar checks every 4 hours SSI Coverage  NEUROLOGIC A:   History of mental retardation P:   Minimize sedating drugs  Continue sertaline    Bincy Varughese,AG-ACNP Pulmonary & [place]   01/31/2016, 11:06 PM  STAFF NOTE: I, Dr. Vilinda Boehringer have personally reviewed patient's available data, including medical history, events of note, physical examination and test results as part of my evaluation. I have discussed with NP Varughese  and other care providers such as pharmacist, RN and RRT.    HPI:75 years old male with known history of renal cell carcinoma s/p multiple ablations, COPD, GERD, mental retardation and dysphagia- indwelling G-tube, and chronic foley, presented with SOB to ER via EMS. Noted to have o2 sats in the 80s, placed on bipap and transferred to ICU unit.    A:75 year old male with known history of renal carcinoma presents with acute shortness 2/2 CHF ,leading to pulmonary edema and hypoxia.   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  Has a hx of hematuria, for which [facility] is following.  - wean off steroids, changed to Prednisone 30mg  x 5 days (noted to have some wheezing on exam); cont with Nebs and bronchodilators - Maintain O2 satscont with supplemental o2 and wean off as tolerated - has a chronic indwelling foley cath, changed and followed by [facility] urology - baseline Cr ~2.0, ARF could be due to CHF, balancing act between diuresis and fluid resuscitation    Stable to transfer to Med-Surg. Spoke with Hospitalist team (Dr. Anselm Jungling), who has accepted patient.   PCCM will follow along on consult service.   Marland Kitchen  Rest per NP/medical resident whose note is outlined above and that I agree with  The patient is critically ill with multiple organ systems failure and requires high complexity decision making for assessment  and support, frequent evaluation and titration of therapies, application of advanced monitoring technologies and extensive interpretation of multiple databases.   Critical Care Time devoted to patient care services described in this note is  40 Minutes.   This time reflects time of care of this signee Dr Vilinda Boehringer.  This critical care time does not reflect procedure time, or teaching time or supervisory time of PA/NP/Med-student/Med Resident etc but could involve care discussion time.  Vilinda Boehringer, MD  Pulmonary and Critical Care Pager [phone] (please enter 7-digits) On Call Pager [phone] (please enter 7-digits)  Note: This note was prepared with Dragon dictation along with smaller phrase technology. Any transcriptional errors that result from this process are unintentional.

## 2016-01-31 NOTE — Progress Notes (Signed)
[facility] Progress Note Patient Name: Matthew Poole DOB: Nov 18, 1940 MRN: [id number]   Date of Service  01/31/2016  HPI/Events of Note  Pt discussed with Bincy.  Pt known to have renal CA, comes in with acute SOB and bleeding (urinary).  Was placed on BiPaP at ED and is comfortable.  CXR with pulm edema, cardiomegaly, B effusion.  CBC Hb 7.7, WBC 11. BMP with HCO3 21, Creat 4.63, BUN 70. Lactate 2.7 troponin 1.46  By history and per my discussion with Bincy, it sounds more acute CHF exacerbation, demand ischemia.  Sepsis is not completely r/o with urine as possible source but SOB/edema seems to be there main driving factor.    eICU Interventions  Admit to ICU. Check ABG. May need to be intubated if he deteriorates.  Cont bipap. Pt looks comfortable per Bincy.  May need diuresis.  Panculture. Vanc and zosyn pending culture.  Trend troponin.  Trend lactate.  Check Hb and hct at 2am . Transfuse if Hb < 7.  Needs Cardiology evaln > per Bincy, Cards has seen pt already.      Intervention Category Evaluation Type: New Patient Evaluation  Matthew Poole 01/31/2016, 11:23 PM

## 2016-02-01 ENCOUNTER — Inpatient Hospital Stay
Admit: 2016-02-01 | Discharge: 2016-02-01 | Disposition: A | Discharge status: Home / Self Care | Payer: PPO | Attending: Internal Medicine | Admitting: Internal Medicine

## 2016-02-01 ENCOUNTER — Inpatient Hospital Stay: Payer: PPO

## 2016-02-01 DIAGNOSIS — Z8551 Personal history of malignant neoplasm of bladder: Secondary | ICD-10-CM

## 2016-02-01 DIAGNOSIS — J9601 Acute respiratory failure with hypoxia: Secondary | ICD-10-CM

## 2016-02-01 DIAGNOSIS — N179 Acute kidney failure, unspecified: Secondary | ICD-10-CM

## 2016-02-01 DIAGNOSIS — N19 Unspecified kidney failure: Secondary | ICD-10-CM

## 2016-02-01 DIAGNOSIS — R319 Hematuria, unspecified: Secondary | ICD-10-CM

## 2016-02-01 LAB — ABO/RH: ABO/RH(D): A POS

## 2016-02-01 LAB — MAGNESIUM: MAGNESIUM: 2.5 mg/dL — AB (ref 1.7–2.4)

## 2016-02-01 LAB — BASIC METABOLIC PANEL
ANION GAP: 12 (ref 5–15)
Anion gap: 10 (ref 5–15)
BUN: 71 mg/dL — AB (ref 6–20)
BUN: 81 mg/dL — AB (ref 6–20)
CALCIUM: 8 mg/dL — AB (ref 8.9–10.3)
CHLORIDE: 107 mmol/L (ref 101–111)
CHLORIDE: 104 mmol/L (ref 101–111)
CO2: 21 mmol/L — ABNORMAL LOW (ref 22–32)
CO2: 21 mmol/L — ABNORMAL LOW (ref 22–32)
CREATININE: 4.56 mg/dL — AB (ref 0.61–1.24)
Calcium: 8.1 mg/dL — ABNORMAL LOW (ref 8.9–10.3)
Creatinine, Ser: 4.51 mg/dL — ABNORMAL HIGH (ref 0.61–1.24)
GFR calc Af Amer: 13 mL/min — ABNORMAL LOW (ref >60)
GFR calc non Af Amer: 12 mL/min — ABNORMAL LOW (ref >60)
GFR, EST AFRICAN AMERICAN: 14 mL/min — AB (ref >60)
GFR, EST NON AFRICAN AMERICAN: 12 mL/min — AB (ref >60)
Glucose, Bld: 157 mg/dL — ABNORMAL HIGH (ref 65–99)
Glucose, Bld: 197 mg/dL — ABNORMAL HIGH (ref 65–99)
POTASSIUM: 5.1 mmol/L (ref 3.5–5.1)
Potassium: 4.5 mmol/L (ref 3.5–5.1)
SODIUM: 138 mmol/L (ref 135–145)
SODIUM: 137 mmol/L (ref 135–145)

## 2016-02-01 LAB — PHOSPHORUS: Phosphorus: 5.3 mg/dL — ABNORMAL HIGH (ref 2.5–4.6)

## 2016-02-01 LAB — CBC
HCT: 22.1 % — ABNORMAL LOW (ref 40.0–52.0)
Hemoglobin: 7.4 g/dL — ABNORMAL LOW (ref 13.0–18.0)
MCH: 29.4 pg (ref 26.0–34.0)
MCHC: 33.4 g/dL (ref 32.0–36.0)
MCV: 88.1 fL (ref 80.0–100.0)
PLATELETS: 251 10*3/uL (ref 150–440)
RBC: 2.51 MIL/uL — ABNORMAL LOW (ref 4.40–5.90)
RDW: 14.5 % (ref 11.5–14.5)
WBC: 9.9 10*3/uL (ref 3.8–10.6)

## 2016-02-01 LAB — ECHOCARDIOGRAM COMPLETE
HEIGHTINCHES: 72 in
Weight: 2335.11 oz

## 2016-02-01 LAB — LACTIC ACID, PLASMA: Lactic Acid, Venous: 2 mmol/L (ref 0.5–1.9)

## 2016-02-01 LAB — GLUCOSE, CAPILLARY
GLUCOSE-CAPILLARY: 127 mg/dL — AB (ref 65–99)
GLUCOSE-CAPILLARY: 153 mg/dL — AB (ref 65–99)
GLUCOSE-CAPILLARY: 178 mg/dL — AB (ref 65–99)
GLUCOSE-CAPILLARY: 218 mg/dL — AB (ref 65–99)
GLUCOSE-CAPILLARY: 238 mg/dL — AB (ref 65–99)
Glucose-Capillary: 109 mg/dL — ABNORMAL HIGH (ref 65–99)

## 2016-02-01 LAB — PREPARE RBC (CROSSMATCH)

## 2016-02-01 LAB — MRSA PCR SCREENING: MRSA by PCR: POSITIVE — AB

## 2016-02-01 LAB — TROPONIN I
TROPONIN I: 1.32 ng/mL — AB (ref <0.03)
Troponin I: 1.3 ng/mL (ref <0.03)
Troponin I: 1.32 ng/mL (ref <0.03)

## 2016-02-01 LAB — BRAIN NATRIURETIC PEPTIDE

## 2016-02-01 LAB — PROCALCITONIN: Procalcitonin: 0.27 ng/mL

## 2016-02-01 MED ORDER — FAMOTIDINE IN NACL 20-0.9 MG/50ML-% IV SOLN
20.0000 mg | INTRAVENOUS | Status: DC
  Administered 2016-02-01 – 2016-02-07 (×7): 20 mg via INTRAVENOUS
  Filled 2016-02-01 (×8): qty 50

## 2016-02-01 MED ORDER — MIDAZOLAM HCL 2 MG/2ML IJ SOLN
INTRAMUSCULAR | Status: AC
  Filled 2016-02-01: qty 2

## 2016-02-01 MED ORDER — JEVITY 1.5 CAL/FIBER PO LIQD
237.0000 mL | Freq: Four times a day (QID) | ORAL | Status: DC
  Administered 2016-02-01 – 2016-02-03 (×9): 237 mL

## 2016-02-01 MED ORDER — DEXTROSE 5 % IV SOLN: 2.0000 g | Freq: Four times a day (QID) | INTRAVENOUS | Status: DC

## 2016-02-01 MED ORDER — INSULIN ASPART 100 UNIT/ML ~~LOC~~ SOLN
2.0000 [IU] | Freq: Four times a day (QID) | SUBCUTANEOUS | Status: DC
  Administered 2016-02-01: 2 [IU] via SUBCUTANEOUS
  Administered 2016-02-02: 4 [IU] via SUBCUTANEOUS
  Administered 2016-02-02: 2 [IU] via SUBCUTANEOUS
  Filled 2016-02-01 (×2): qty 2
  Filled 2016-02-01: qty 4

## 2016-02-01 MED ORDER — VANCOMYCIN HCL IN DEXTROSE 750-5 MG/150ML-% IV SOLN
750.0000 mg | INTRAVENOUS | Status: DC
  Filled 2016-02-01: qty 150

## 2016-02-01 MED ORDER — FENTANYL CITRATE (PF) 100 MCG/2ML IJ SOLN
50.0000 ug | Freq: Once | INTRAMUSCULAR | Status: AC
Start: 2016-02-01 — End: 2016-02-01
  Administered 2016-02-01: 50 ug via INTRAVENOUS
  Filled 2016-02-01: qty 2

## 2016-02-01 MED ORDER — MIDAZOLAM HCL 2 MG/2ML IJ SOLN
0.5000 mg | Freq: Once | INTRAMUSCULAR | Status: AC
  Administered 2016-02-01: 0.5 mg via INTRAVENOUS

## 2016-02-01 MED ORDER — IPRATROPIUM-ALBUTEROL 0.5-2.5 (3) MG/3ML IN SOLN
RESPIRATORY_TRACT | Status: AC
  Filled 2016-02-01: qty 3

## 2016-02-01 MED ORDER — DEXTROSE 5 % IV SOLN
1.0000 g | INTRAVENOUS | Status: DC
  Administered 2016-02-01 – 2016-02-04 (×4): 1 g via INTRAVENOUS
  Filled 2016-02-01 (×5): qty 10

## 2016-02-01 MED ORDER — ORAL CARE MOUTH RINSE
15.0000 mL | Freq: Two times a day (BID) | OROMUCOSAL | Status: DC
  Administered 2016-02-01 – 2016-02-06 (×7): 15 mL via OROMUCOSAL

## 2016-02-01 MED ORDER — SODIUM CHLORIDE 0.9 % IV SOLN
Freq: Once | INTRAVENOUS | Status: AC
  Administered 2016-02-01: 10:00:00 via INTRAVENOUS

## 2016-02-01 MED ORDER — CHLORHEXIDINE GLUCONATE 0.12 % MT SOLN
15.0000 mL | Freq: Two times a day (BID) | OROMUCOSAL | Status: DC
  Administered 2016-02-01 – 2016-02-07 (×13): 15 mL via OROMUCOSAL
  Filled 2016-02-01 (×12): qty 15

## 2016-02-01 MED ORDER — METHYLPREDNISOLONE SODIUM SUCC 125 MG IJ SOLR
60.0000 mg | Freq: Four times a day (QID) | INTRAMUSCULAR | Status: DC
  Administered 2016-02-01: 60 mg via INTRAVENOUS
  Filled 2016-02-01: qty 2

## 2016-02-01 MED ORDER — IPRATROPIUM-ALBUTEROL 0.5-2.5 (3) MG/3ML IN SOLN
3.0000 mL | RESPIRATORY_TRACT | Status: DC
  Administered 2016-02-01 – 2016-02-02 (×8): 3 mL via RESPIRATORY_TRACT
  Filled 2016-02-01 (×9): qty 3

## 2016-02-01 MED ORDER — INSULIN ASPART 100 UNIT/ML ~~LOC~~ SOLN
2.0000 [IU] | SUBCUTANEOUS | Status: DC
  Administered 2016-02-01: 4 [IU] via SUBCUTANEOUS
  Administered 2016-02-01 (×2): 6 [IU] via SUBCUTANEOUS
  Administered 2016-02-01: 2 [IU] via SUBCUTANEOUS
  Filled 2016-02-01: qty 2
  Filled 2016-02-01: qty 4
  Filled 2016-02-01 (×2): qty 6

## 2016-02-01 MED ORDER — DEXTROSE 5 % IV SOLN
1.0000 g | Freq: Three times a day (TID) | INTRAVENOUS | Status: DC
  Administered 2016-02-01 (×2): 1 g via INTRAVENOUS
  Filled 2016-02-01 (×5): qty 1

## 2016-02-01 MED ORDER — FUROSEMIDE 10 MG/ML IJ SOLN
20.0000 mg | Freq: Once | INTRAMUSCULAR | Status: AC
Start: 2016-02-01 — End: 2016-02-01
  Administered 2016-02-01: 20 mg via INTRAVENOUS
  Filled 2016-02-01: qty 2

## 2016-02-01 MED ORDER — PREDNISONE 20 MG PO TABS
30.0000 mg | ORAL_TABLET | Freq: Every day | ORAL | Status: DC
  Administered 2016-02-01 – 2016-02-06 (×5): 30 mg via ORAL
  Filled 2016-02-01 (×6): qty 1

## 2016-02-01 MED ORDER — SERTRALINE HCL 100 MG PO TABS
50.0000 mg | ORAL_TABLET | Freq: Every day | ORAL | Status: DC
  Administered 2016-02-01 – 2016-02-06 (×5): 50 mg via ORAL
  Filled 2016-02-01 (×5): qty 1

## 2016-02-01 MED ORDER — IPRATROPIUM-ALBUTEROL 0.5-2.5 (3) MG/3ML IN SOLN
3.0000 mL | RESPIRATORY_TRACT | Status: DC | PRN
  Administered 2016-02-02 – 2016-02-07 (×4): 3 mL via RESPIRATORY_TRACT
  Filled 2016-02-01 (×4): qty 3

## 2016-02-01 NOTE — Progress Notes (Signed)
Dialysis complete

## 2016-02-01 NOTE — Consult Note (Signed)
Date: 02/01/2016                  Patient Name:  Matthew Poole  MRN: [id number]  DOB: Apr 30, 1941  Age / Sex: 75 y.o., male         PCP: [name], MD                 Service Requesting Consult: ICU team/Internal medicine                 Reason for Consult: ARF            History of Present Illness: Patient is a 75 y.o. 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 Elyse Hsu on 02/07/2016. Last stent exchange was on 11/08/2015 when a 6 x 26 cm JJ stent was placed in the left ureter.  He presented via EMS due to Shortness of breath. He was treated with Bactrim last week for MRSA UTI in preparation for surgery. Upon admission he was placed on BiPAP. His troponin is mildly elevated. His creatinine is elevated to 4.56. Baseline Creatinine is 2.0 on 11/23/2015 GFR 33  At present he has an indwelling foley catheter. UOP is <150 cc last 24 hrs He is requiring BiPAP Getting ready for blood transfusion   Medications: Outpatient medications: Prescriptions Prior to Admission  Medication Sig Dispense Refill Last Dose  . acetaminophen (TYLENOL) 325 MG tablet Take 325 mg by mouth every 4 (four) hours as needed for mild pain, fever or headache.    PRN at PRN  . albuterol (PROVENTIL) (2.5 MG/3ML) 0.083% nebulizer solution Take 3 mLs (2.5 mg total) by nebulization every 6 (six) hours as needed for wheezing. 75 mL 12 PRN at PRN  . chlorhexidine (PERIDEX) 0.12 % solution Use as directed 1 mL in the mouth or throat 2 (two) times daily.    01/31/2016 at 0800  . cholecalciferol (VITAMIN D) 1000 UNITS tablet Take 1,000 Units by mouth daily.   01/31/2016 at 0800  . ciprofloxacin (CIPRO) 250 MG tablet Take 250 mg by mouth 2 (two) times daily. For 10 days   01/31/2016 at 0800  . docusate  sodium (COLACE) 100 MG capsule Take 100 mg by mouth 2 (two) times daily.   01/31/2016 at 0800  . doxazosin (CARDURA) 2 MG tablet Take 2 mg by mouth daily.   01/31/2016 at 0800  . ferrous sulfate 325 (65 FE) MG tablet Take 325 mg by mouth daily with breakfast.   01/31/2016 at 0800  . loperamide (IMODIUM) 2 MG capsule Take 2 mg by mouth 4 (four) times daily as needed for diarrhea or loose stools.   01/30/2016 at am  . Multiple Vitamins-Minerals (MULTIVITAMIN WITH IRON-MINERALS) liquid Take 5 mLs by mouth daily.    01/31/2016 at 0800  . Nutritional Supplements (FEEDING SUPPLEMENT, JEVITY 1.5 CAL,) LIQD Place 237 mLs into feeding tube 4 (four) times daily.   01/31/2016 at 1000  . omeprazole (PRILOSEC) 20 MG capsule Take 20 mg by mouth 2 (two) times daily.    01/31/2016 at 0800  . oxybutynin (DITROPAN) 5 MG tablet Take 5 mg by mouth 3 (three) times daily as needed for bladder spasms.   PRN at PRN  . Probiotic Product (4X PROBIOTIC PO) Take 1 capsule by mouth daily.    01/31/2016 at 0800  .  senna (SENOKOT) 8.6 MG TABS tablet Place 2 tablets into feeding tube daily.   01/31/2016 at 0800  . sertraline (ZOLOFT) 50 MG tablet Take 50 mg by mouth daily.   01/31/2016 at 0800  . sodium bicarbonate 325 MG tablet Take 975 mg by mouth 3 (three) times daily.   01/31/2016 at 0800  . sulfamethoxazole-trimethoprim (BACTRIM,SEPTRA) 400-80 MG tablet Take 1 tablet by mouth 2 (two) times daily. For 10 days   01/31/2016 at am  . triamcinolone cream (KENALOG) 0.1 % Apply 1 application topically 2 (two) times daily.   01/31/2016 at 0800  . vitamin C (ASCORBIC ACID) 250 MG tablet Take 500 mg by mouth 2 (two) times daily.    01/31/2016 at 0800    Current medications: Current Facility-Administered Medications  Medication Dose Route Frequency Provider Last Rate Last Dose  . 0.9 %  sodium chloride infusion  10 mL/hr Intravenous Once Joanne Gavel, MD      . 0.9 %  sodium chloride infusion  250 mL Intravenous PRN Bincy S Varughese, NP      .  aztreonam (AZACTAM) 1 g in dextrose 5 % 50 mL IVPB  1 g Intravenous Q8H Joanne Gavel, MD   1 g at 02/01/16 0534  . chlorhexidine (PERIDEX) 0.12 % solution 15 mL  15 mL Mouth Rinse BID Bincy S Varughese, NP   15 mL at 02/01/16 1013  . famotidine (PEPCID) IVPB 20 mg premix  20 mg Intravenous Q24H Bincy S Varughese, NP   20 mg at 02/01/16 0137  . feeding supplement (JEVITY 1.5 CAL/FIBER) liquid 237 mL  237 mL Per Tube QID Bincy S Varughese, NP   237 mL at 02/01/16 1011  . furosemide (LASIX) injection 20 mg  20 mg Intravenous Once Vishal Mungal, MD      . insulin aspart (novoLOG) injection 2-6 Units  2-6 Units Subcutaneous Q4H Bincy S Varughese, NP   4 Units at 02/01/16 0458  . ipratropium-albuterol (DUONEB) 0.5-2.5 (3) MG/3ML nebulizer solution 3 mL  3 mL Nebulization Q4H Bincy S Varughese, NP   3 mL at 02/01/16 0835  . ipratropium-albuterol (DUONEB) 0.5-2.5 (3) MG/3ML nebulizer solution 3 mL  3 mL Nebulization Q4H PRN Bincy S Varughese, NP      . MEDLINE mouth rinse  15 mL Mouth Rinse q12n4p Bincy S Varughese, NP      . predniSONE (DELTASONE) tablet 30 mg  30 mg Oral Q breakfast Vishal Mungal, MD   30 mg at 02/01/16 1013  . sertraline (ZOLOFT) tablet 50 mg  50 mg Oral Daily Bincy S Varughese, NP   50 mg at 02/01/16 1014  . vancomycin (VANCOCIN) IVPB 750 mg/150 ml premix  750 mg Intravenous Q48H Bincy S Varughese, NP          Allergies: Allergies  Allergen Reactions  . Penicillins Itching, Nausea And Vomiting, Rash and Other (See Comments)    Unable to obtain enough information to answer additional questions about this medication.        Past Medical History: Past Medical History:  Diagnosis Date  . Anemia   . Cancer (HCC)    renal cell carcinoma  . COPD (chronic obstructive pulmonary disease) ([place])   . Depression   . Difficulty walking   . Dysphagia, oropharyngeal   . G tube feedings ([place])   . GERD (gastroesophageal reflux disease)   . Mental retardation   . Muscle weakness   .  Osteoporosis   . Psoriasis   . Psoriasis   .  Reflux   . Renal cell carcinoma ([place])   . Renal disorder    acute on chronic renal disorder  . Sepsis secondary to UTI [facility])      Past Surgical History: Past Surgical History:  Procedure Laterality Date  . PEG PLACEMENT       Family History: Family History  Problem Relation Age of Onset  . Family history unknown: Yes     Social History: Social History   Social History  . Marital status: Unknown    Spouse name: N/A  . Number of children: N/A  . Years of education: N/A   Occupational History  . Not on file.   Social History Main Topics  . Smoking status: Current Every Day Smoker    Packs/day: 0.50    Types: Cigarettes  . Smokeless tobacco: Never Used  . Alcohol use No  . Drug use: No  . Sexual activity: Not on file   Other Topics Concern  . Not on file   Social History Narrative  . No narrative on file     Review of Systems: n/A due to patient requiring NIPPV for Shortness of breath Gen:  HEENT:  CV:  Resp:  GI: GU :  MS:  Derm:   Psych: Heme:  Neuro:  Endocrine  Vital Signs: Blood pressure 118/74, pulse (!) 106, temperature 97.7 F (36.5 C), temperature source Axillary, resp. rate (!) 22, height 6' (1.829 m), weight 66.2 kg (145 lb 15.1 oz), SpO2 96 %.   Intake/Output Summary (Last 24 hours) at 02/01/16 1018 Last data filed at 02/01/16 1000  Gross per 24 hour  Intake             1310 ml  Output              150 ml  Net             1160 ml    Weight trends: Filed Weights   01/31/16 1949 02/01/16 0055 02/01/16 0536  Weight: 72.6 kg (160 lb) 66.5 kg (146 lb 9.7 oz) 66.2 kg (145 lb 15.1 oz)    Physical Exam: General:  chronically ill appearing, laying in Bed  HEENT BiPAP mask present, no scleral icterus  Neck:  + JVD  Lungs: Coarse crackles at bases and rhonchi Rt > left  Heart::  tachycardic, no rub  Abdomen: Soft, non tender  Extremities:  + peripheral edema over b/l ankles   Neurologic: Able to follow simple commands  Skin: Warm, dry     Foley: Present with small amount of urine       Lab results: Basic Metabolic Panel:  Recent Labs Lab 01/31/16 1956 02/01/16 0332  NA 138 138  K 4.5 4.5  CL 105 107  CO2 21* 21*  GLUCOSE 293* 157*  BUN 70* 71*  CREATININE 4.63* 4.56*  CALCIUM 8.0* 8.0*  MG  --  2.5*  PHOS  --  5.3*    Liver Function Tests:  Recent Labs Lab 01/31/16 1956  AST 24  ALT 11*  ALKPHOS 99  BILITOT 0.5  PROT 6.4*  ALBUMIN 2.5*   No results for input(s): LIPASE, AMYLASE in the last 168 hours. No results for input(s): AMMONIA in the last 168 hours.  CBC:  Recent Labs Lab 01/31/16 1956 02/01/16 0332  WBC 11.0* 9.9  NEUTROABS 9.1*  --   HGB 7.7* 7.4*  HCT 24.2* 22.1*  MCV 89.2 88.1  PLT 263 251    Cardiac Enzymes:  Recent Labs Lab  02/01/16 0332  TROPONINI 1.30*    BNP: Invalid input(s): POCBNP  CBG:  Recent Labs Lab 02/01/16 0048 02/01/16 0344 02/01/16 0807  GLUCAP 238* 153* 109*    Microbiology: Recent Results (from the past 720 hour(s))  Blood culture (routine x 2)     Status: None (Preliminary result)   Collection Time: 01/31/16  8:00 PM  Result Value Ref Range Status   Specimen Description BLOOD  LEFT HAND  Final   Special Requests   Final    BOTTLES DRAWN AEROBIC AND ANAEROBIC  AER 3CC ANA 2CC   Culture NO GROWTH < 12 HOURS  Final   Report Status PENDING  Incomplete  Blood culture (routine x 2)     Status: None (Preliminary result)   Collection Time: 01/31/16  8:03 PM  Result Value Ref Range Status   Specimen Description BLOOD  R WRIST  Final   Special Requests BOTTLES DRAWN AEROBIC AND ANAEROBIC  AER4CC ANA3CC  Final   Culture NO GROWTH < 12 HOURS  Final   Report Status PENDING  Incomplete  MRSA PCR Screening     Status: Abnormal   Collection Time: 02/01/16 12:52 AM  Result Value Ref Range Status   MRSA by PCR POSITIVE (A) NEGATIVE Final    Comment:        The GeneXpert MRSA  Assay (FDA approved for NASAL specimens only), is one component of a comprehensive MRSA colonization surveillance program. It is not intended to diagnose MRSA infection nor to guide or monitor treatment for MRSA infections. RESULT CALLED TO, READ BACK BY AND VERIFIED WITH: ALISA RAWLINS @ [id number] ON 02/01/2016 BY CAF      Coagulation Studies:  Recent Labs  01/31/16 1956  LABPROT 16.7*  INR 1.34    Urinalysis:  Recent Labs  01/31/16 2136  COLORURINE RED*  LABSPEC 1.017  PHURINE 6.0  GLUCOSEU NEGATIVE  HGBUR 3+*  BILIRUBINUR NEGATIVE  KETONESUR NEGATIVE  PROTEINUR 100*  NITRITE NEGATIVE  LEUKOCYTESUR 3+*        Imaging: Dg Chest Port 1 View  Result Date: 02/01/2016 CLINICAL DATA:  Respiratory failure. EXAM: PORTABLE CHEST 1 VIEW COMPARISON:  01/31/2016. FINDINGS: Cardiomegaly with diffuse bilateral pulmonary infiltrates consistent with pulmonary edema and bilateral pleural effusions again noted with minimal improvement. No pneumothorax. IMPRESSION: Persistent congestive heart failure bilateral pulmonary edema and small pleural effusions. Minimal interval change. Electronically Signed   By: Marcello Moores  Register   On: 02/01/2016 07:07   Dg Chest Port 1 View  Result Date: 01/31/2016 CLINICAL DATA:  Shortness of breath and decreased oxygen saturation on oxygen. Current treatment for urinary tract infection. Smoker. EXAM: PORTABLE CHEST 1 VIEW COMPARISON:  04/25/2015 FINDINGS: Cardiac enlargement with diffuse pulmonary vascular congestion. Bilateral perihilar airspace and diffuse interstitial disease consistent with edema. Bilateral pneumonia also possible but felt less likely due vascular congestion and cardiac enlargement. Small bilateral pleural effusions. No pneumothorax. IMPRESSION: Cardiac enlargement with pulmonary vascular congestion, diffuse bilateral pulmonary edema, and small pleural effusions. Electronically Signed   By: Lucienne Capers M.D.   On: 01/31/2016 21:16       Assessment & Plan: Pt is a 75 y.o. 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 Elyse Hsu on 02/07/2016.   1. ARF, oliguric 2. CKD st 3, Baseline Cr 2.0 From 11/23/15. GFR 33.  3. Solitary left Kidney 4. Low grade Ta bladder cancer with left renal pelvic mass s/p multiple ablations 5. Acute respiratory failure requiring NIPPV 6. Anemia, requiring blood transfusion 7. NSTEMI/ ACS  ARF is likely multifactorial. Renal U.S is pending to evaluate for obstruction. Bactrim use from last week could have contributed to ARF  At present, Patient is oligoanuric. His respiratory status is tenous. He is getting ready to receive blood transfusion. Recommend dialysis to manage volume by removing fluid to assist with respiratory status. Discussed with Mr. Bosie Helper who is patient's POA, that short term dialysis will help his respiratory status and will give [place] time to figure out cause of ARF and management strategies.  All questions were answered. Mr Wynetta Emery thinks that patient would not want long term dialysis due to his poor heath. Agree, that due to his multiple underlying co-morbid illnesses, he would not benefit from long term dialysis.  Case discussed with Dr Anselm Jungling and Dr Stevenson Clinch. ICU team to place dialysis catheter.  Will follow

## 2016-02-01 NOTE — Progress Notes (Signed)
Update  Nephrology seen and evaluated the patient (please see their note) Plans for a session of HD given level of renal failure and oliguria. Renal U\S pending RIJ Vascath placed.  HCPOA consented for vascath placement and hemodialysis and in agreement with plan outlined by Nephrology.    Vilinda Boehringer, MD [facility] Pager [phone] (please enter 7-digits) On Call Pager - [phone] (please enter 7-digits)

## 2016-02-01 NOTE — Progress Notes (Signed)
Patient has been transitioned to nasal cannula off bipap by RN. Patient states breathing is better. Bipap on SB

## 2016-02-01 NOTE — Progress Notes (Signed)
Received pt via stretcher on BiPap from ED.  Pt transferred self to bed and tolerated well.  VSS except for pt's tachycardia.  Bipap adjusted from 40% to 35% by RT.  Pt tolerating this change well.  No SOB, DOE, or resp distress.  Pt refusing to answer questions.  When asked the date to assess orientation, pt just answers, "I don't know."  Will not even choose an answer when given the choice between 1990 and 2017.  Pt will not tell us how tall he is.  [place] only state he is in the, "hospital."  Reassurance given to pt and explained plan of care.  Pt cooperative with care, just not with answering any questions.

## 2016-02-01 NOTE — Consult Note (Signed)
I have been asked to see the patient by Dr. Vilinda Boehringer, for evaluation and management of hematuria.  History of present illness:  Matthew Poole is a pleasant 75 y.o. male admitted with CHF exascerbation and worsening renal failure.  His initial complaint was difficulty breathing. 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 Elyse Hsu [facility] urology)  on 02/07/2016.   Last stent exchange was on 11/08/2015 when a 6 x 26 cm JJ stent was placed in the left ureter.   Upon admission he was oliguric.  Urine was noted to be bloody.  UA consistent with chronic indwelling foley.  Patient lives in group home, [place] very involved and familiar with patient.  Urologic history: -s/p right nephroureterecomty in December 2014 for a high volume low grade right renal pelvic tumor.  -Chronic retention managed by indwelling catheter since his surgery. 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  This was followed initially but with progression he has required resection with ureteroscopy several times.    - Creatinine was 2.9 on 01/24/16 but was as high as 5.21 on 09/10/15.  Review of systems: A 12 point comprehensive review of systems was obtained and is negative unless otherwise stated in the history of present illness.  Patient Active Problem List   Diagnosis Date Noted  . Acute respiratory failure ([place]) 01/31/2016  . Malnutrition of moderate degree 04/06/2015  . UTI (lower urinary tract infection) 04/05/2015  . Renal failure 04/05/2015  . Sepsis secondary to UTI ([place]) 12/25/2014  . Sepsis ([place]) 12/25/2014    No current facility-administered medications on file prior to encounter.    Current Outpatient Prescriptions on File Prior to  Encounter  Medication Sig Dispense Refill  . acetaminophen (TYLENOL) 325 MG tablet Take 325 mg by mouth every 4 (four) hours as needed for mild pain, fever or headache.     . albuterol (PROVENTIL) (2.5 MG/3ML) 0.083% nebulizer solution Take 3 mLs (2.5 mg total) by nebulization every 6 (six) hours as needed for wheezing. 75 mL 12  . chlorhexidine (PERIDEX) 0.12 % solution Use as directed 1 mL in the mouth or throat 2 (two) times daily.     . cholecalciferol (VITAMIN D) 1000 UNITS tablet Take 1,000 Units by mouth daily.    Marland Kitchen doxazosin (CARDURA) 2 MG tablet Take 2 mg by mouth daily.    . Multiple Vitamins-Minerals (MULTIVITAMIN WITH IRON-MINERALS) liquid Take 5 mLs by mouth daily.     . Nutritional Supplements (FEEDING SUPPLEMENT, JEVITY 1.5 CAL,) LIQD Place 237 mLs into feeding tube 4 (four) times daily.    Marland Kitchen omeprazole (PRILOSEC) 20 MG capsule Take 20 mg by mouth 2 (two) times daily.     . Probiotic Product (4X PROBIOTIC PO) Take 1 capsule by mouth daily.     Marland Kitchen triamcinolone cream (KENALOG) 0.1 % Apply 1 application topically 2 (two) times daily.    . vitamin C (ASCORBIC ACID) 250 MG tablet Take 500 mg by mouth 2 (two) times daily.       Past Medical History:  Diagnosis Date  . Anemia   . Cancer (HCC)    renal cell carcinoma  . COPD (chronic obstructive pulmonary disease) ([place])   . Depression   . Difficulty walking   .  Dysphagia, oropharyngeal   . G tube feedings ([place])   . GERD (gastroesophageal reflux disease)   . Mental retardation   . Muscle weakness   . Osteoporosis   . Psoriasis   . Psoriasis   . Reflux   . Renal cell carcinoma ([place])   . Renal disorder    acute on chronic renal disorder  . Sepsis secondary to UTI [facility])     Past Surgical History:  Procedure Laterality Date  . PEG PLACEMENT      Social History  Substance Use Topics  . Smoking status: Current Every Day Smoker    Packs/day: 0.50    Types: Cigarettes  . Smokeless tobacco: Never Used  . Alcohol use No     Family History  Problem Relation Age of Onset  . Family history unknown: Yes    PE: Vitals:   02/01/16 0808 02/01/16 0956 02/01/16 1000 02/01/16 1023  BP: 129/84  118/74 117/72  Pulse: (!) 112 (!) 106 (!) 106 (!) 105  Resp: (!) 24 (!) 25 (!) 22 19  Temp: 98.8 F (37.1 C)  97.7 F (36.5 C) 97.4 F (36.3 C)  TempSrc: Oral  Axillary Axillary  SpO2: 92% 96% 96% 95%  Weight:      Height:       Patient appears to be in no acute distress  patient is alert and oriented x3 Atraumatic normocephalic head No cervical or supraclavicular lymphadenopathy appreciated Mild/moderate increased work of breathing, no audible wheezes/rhonchi Regular sinus rhythm/rate Abdomen is soft, nontender, nondistended, no CVA or suprapubic tenderness Lower extremities are symmetric without appreciable edema Grossly neurologically intact No identifiable skin lesions   Recent Labs  01/31/16 1956 02/01/16 0332  WBC 11.0* 9.9  HGB 7.7* 7.4*  HCT 24.2* 22.1*    Recent Labs  01/31/16 1956 02/01/16 0332  NA 138 138  K 4.5 4.5  CL 105 107  CO2 21* 21*  GLUCOSE 293* 157*  BUN 70* 71*  CREATININE 4.63* 4.56*  CALCIUM 8.0* 8.0*    Recent Labs  01/31/16 1956  INR 1.34   No results for input(s): LABURIN in the last 72 hours. Results for orders placed or performed during the hospital encounter of 01/31/16  Blood culture (routine x 2)     Status: None (Preliminary result)   Collection Time: 01/31/16  8:00 PM  Result Value Ref Range Status   Specimen Description BLOOD  LEFT HAND  Final   Special Requests   Final    BOTTLES DRAWN AEROBIC AND ANAEROBIC  AER 3CC ANA 2CC   Culture NO GROWTH < 12 HOURS  Final   Report Status PENDING  Incomplete  Blood culture (routine x 2)     Status: None (Preliminary result)   Collection Time: 01/31/16  8:03 PM  Result Value Ref Range Status   Specimen Description BLOOD  R WRIST  Final   Special Requests BOTTLES DRAWN AEROBIC AND ANAEROBIC  AER4CC ANA3CC   Final   Culture NO GROWTH < 12 HOURS  Final   Report Status PENDING  Incomplete  MRSA PCR Screening     Status: Abnormal   Collection Time: 02/01/16 12:52 AM  Result Value Ref Range Status   MRSA by PCR POSITIVE (A) NEGATIVE Final    Comment:        The GeneXpert MRSA Assay (FDA approved for NASAL specimens only), is one component of a comprehensive MRSA colonization surveillance program. It is not intended to diagnose MRSA infection nor to guide or  monitor treatment for MRSA infections. RESULT CALLED TO, READ BACK BY AND VERIFIED WITH: ALISA RAWLINS @ [id number] ON 02/01/2016 BY CAF     Imaging: Bedside ultrasound was performed to evaluate the patient's left kidney. I was able to identify the left kidney and noted that there was no significant hydronephrosis.  A formal ultrasound was subsequently obtained confirming lack of hydronephrosis, with a left lower pole mass consistent with the patient's known history of transitional cell carcinoma.  Imp: Unfortunate 75 year old male with significant comorbidity admitted to the ICU with worsening heart failure and acute on chronic renal failure. 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 He had subsequent recurrence of his cancer on his contralateral kidney. This is been managed with ureteroscopic ablation and stent exchange. He is scheduled to undergo re-evaluation with ureteroscopy and ablation next week. Stent that is in place today was placed 3 months ago. There is no evidence of hydronephrosis on the patient's renal ultrasound. I suspect his renal failure is prerenal. The patient's hematuria is likely related to the recurrence of his cancer. He does have a chronic indwelling Foley and as such is chronically colonized with bacteria. He does not appear septic nor to have a urinary tract infection at this time.  Recommendations: Given that the patient is scheduled to undergo repeat  ureteroscopy and stent exchange in the coming days there is no urologic intervention indicated at the current time. I have tried to communicate to the patient's primary urologist Dr. Elyse Hsu at [facility] urology that the patient is currently admitted with heart failure.  The patient is scheduled to undergo dialysis to help with his volume as well as his renal function. Once his heart failure has been adequately controlled with diuresis he may well be able to undergo his scheduled procedure on Monday 9/25. This would ultimately be up to his primary urologist.  I see 2 possibilities for his pending urologic procedure. Either he could be transferred to [facility] and further optimized prior to his procedure on Monday or this could be postponed until the patient is back on his feet and rescheduled at a later date. If I hear anything from his urologist, I will communicate that through [place]. We will plan to follow the patient peripherally, please page with additional questions or concerns.   Cc: Dr. Elyse Hsu, M.D., [facility] urology  [place], [place]

## 2016-02-01 NOTE — Progress Notes (Signed)
BiPap removed for oral hygiene and pt placed on 4L [place].  Orally suctioned a large amount of thick, tan secretions.  Pt tolerated well and maintained SaO2.  Pt reports feeling better thank earlier.  Findings reported to Lindwood Qua, NP along with +MRSA results from PCR nasal swab.  Orders received to leave pt off BiPap, maintain oxygen therapy @ 4L and place pt back on BiPap if he experiences resp distress.  Updated plan of care communicated to pt.

## 2016-02-01 NOTE — Progress Notes (Signed)
Pharmacy Antibiotic Note  Matthew Poole is a 75 y.o. male admitted on 01/31/2016 with sepsis.  Pharmacy has been consulted for vancomycin and aztreonam dosing.  Plan: DW 66.5kg  Vd 47L kei 0.015 hr-1  T1/2 47 hours Vancomycin 750 mg q 48 hours ordered with stacked dosing. Level before 5th dose. Goal trough 15-20.  Aztreonam 1 gram q 8 hours ordered.  Height: 6' (182.9 cm) Weight: 145 lb 15.1 oz (66.2 kg) IBW/kg (Calculated) : 77.6  Temp (24hrs), Avg:98.2 F (36.8 C), Min:97.9 F (36.6 C), Max:98.4 F (36.9 C)   Recent Labs Lab 01/31/16 1956 01/31/16 1958 01/31/16 2350 02/01/16 0332  WBC 11.0*  --   --  9.9  CREATININE 4.63*  --   --  4.56*  LATICACIDVEN  --  2.7* 2.0*  --     Estimated Creatinine Clearance: 13.5 mL/min (by C-G formula based on SCr of 4.56 mg/dL (H)).    Allergies  Allergen Reactions  . Penicillins Itching, Nausea And Vomiting, Rash and Other (See Comments)    Unable to obtain enough information to answer additional questions about this medication.      Antimicrobials this admission: vancomycin  >>  aztreonam  >>   Dose adjustments this admission:   Microbiology results: 9/18 BCx: pending 9/18 UCx: pending  9/19 MRSA PCR: (+)   9/18 UA: LE(+) NO2(-) WBC TNTC  Thank you for allowing pharmacy to be a part of this patient's care.  Mykalah Saari S 02/01/2016 5:42 AM

## 2016-02-01 NOTE — Progress Notes (Signed)
Post dialysis 

## 2016-02-01 NOTE — Procedures (Signed)
  Procedure Note: Matthew Poole , [id number] , IC20A/IC20A-AA  Indications: Hemodynamic monitoring / Intravenous access/HD  Benefits, risks (including bleeding, infection,  Injury, etc.), and alternatives explained to [name] [facility]) who voiced understanding.  Questions were sought and answered.   HCPOA agreed to proceed with the procedure.  Consent is signed and on chart. A time-out was completed verifying correct patient, procedure and site.  A 2 lumen Vascular catheter available at the time of procedure.  The patient was placed in a dependent position appropriate for central line placement based on the vein to be cannulated.   The patient's RIGHT Internal Jugular Vein was prepped and draped in a sterile fashion.  1% Lidocaine WAS used to anesthetize the surrounding skin area.   A 3 lumen catheter was introduced into the RIGHT Internal Jugular Vein using Seldinger technique, visualized under ultrasound.  The catheter was threaded smoothly over the guide wire and appropriate blood return was obtained.  Each lumen of the catheter was evacuated of air and flushed with sterile saline.  The catheter was then sutured in place to the skin and a sterile dressing applied.  Perfusion to the extremity distal to the point of catheter insertion was checked and found to be adequate.    Chest X-ray was ordered for confirmation of placement.  The patient tolerated the procedure well and there were no complications.  Vilinda Boehringer, MD [place] [facility] Pager [phone] (please enter 7-digits) On Call Pager - [phone] (please enter 7-digits)

## 2016-02-01 NOTE — Progress Notes (Addendum)
Inpatient Diabetes Program Recommendations  AACE/ADA: New Consensus Statement on Inpatient Glycemic Control (2015)  Target Ranges:  Prepandial:   less than 140 mg/dL      Peak postprandial:   less than 180 mg/dL (1-2 hours)      Critically ill patients:  140 - 180 mg/dL   Lab Results  Component Value Date   GLUCAP 109 (H) 02/01/2016   HGBA1C 5.9 04/05/2015    Review of Glycemic Control  Results for Matthew Poole, Matthew Poole (MRN [id number]) as of 02/01/2016 09:13  Ref. Range 02/01/2016 00:48 02/01/2016 03:44 02/01/2016 08:07  Glucose-Capillary Latest Ref Range: 65 - 99 mg/dL 238 (H) 153 (H) 109 (H)    Diabetes history: none noted Outpatient Diabetes medications: none Current orders for Inpatient glycemic control: Novolog 2-6 units q4h  *Prednisone 30 mg qam  Inpatient Diabetes Program Recommendations:   Agree with current orders for blood sugar management  Per ADA recommendations "consider performing an A1C on all patients with diabetes or hyperglycemia admitted to the hospital if not performed in the prior 3 months".    Gentry Fitz, RN, BA, MHA, CDE Diabetes Coordinator Inpatient Diabetes Program  [phone] (Team Pager) [phone] ([place]) 02/01/2016 9:19 AM

## 2016-02-01 NOTE — Consult Note (Signed)
Matthew Kitchen Poole  CARDIOLOGY CONSULT NOTE  Patient ID: Matthew Poole MRN: [id number] DOB/AGE: February 11, 1941 75 y.o.  Admit date: 01/31/2016 Referring Physician Dr. Stevenson Clinch Primary Physician   Primary Cardiologist None Reason for Consultation chf  HPI: 75 years old male with known history of renal cell carcinoma s/p multiple ablations, COPD, GERD, mental retardation and dysphagia- indwelling G-tube.. Patient presented to the ED via EMS due to shortness of breath. CXR revealed cardiomegaly with pulmonary vascular congestion, diffuse bilateral pulmonary edema and small pleural effusions. EKG revealed sinus tachycardia with diffuse st depression. Echo done this am showed ef of 25-30% with moderate to severe MR. Echo done in [facility] at [facility] was read as normal ef. His troponin was 1.32. He is not able to give a very clear history. His main complaint is sob. 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 He was scheduled for possible left ureteral stent exchange 02/07/16. He had pulse ox in the 80s and was given solumedrol and inhaled bronchodilators. He was tachypnaic and tachycardic on presentation. He had acute on  Chronic renal insuffiency with cr of 4.63 with creatinine 01/24/16 of 2.90.       Review of Systems  Constitutional: Positive for malaise/fatigue.  HENT: Negative.   Eyes: Negative.   Respiratory: Positive for shortness of breath.   Cardiovascular: Negative.   Gastrointestinal: Negative.   Genitourinary: Negative.   Musculoskeletal: Negative.   Neurological: Positive for weakness.  Endo/Heme/Allergies: Negative.   Psychiatric/Behavioral: Positive for memory loss.    Past Medical History:  Diagnosis Date  . Anemia   . Cancer (HCC)    renal cell carcinoma  . COPD (chronic obstructive pulmonary disease) ([place])   . Depression   . Difficulty  walking   . Dysphagia, oropharyngeal   . G tube feedings ([place])   . GERD (gastroesophageal reflux disease)   . Mental retardation   . Muscle weakness   . Osteoporosis   . Psoriasis   . Psoriasis   . Reflux   . Renal cell carcinoma ([place])   . Renal disorder    acute on chronic renal disorder  . Sepsis secondary to UTI [facility])     Family History  Problem Relation Age of Onset  . Family history unknown: Yes    Social History   Social History  . Marital status: Unknown    Spouse name: N/A  . Number of children: N/A  . Years of education: N/A   Occupational History  . Not on file.   Social History Main Topics  . Smoking status: Current Every Day Smoker    Packs/day: 0.50    Types: Cigarettes  . Smokeless tobacco: Never Used  . Alcohol use No  . Drug use: No  . Sexual activity: Not on file   Other Topics Concern  . Not on file   Social History Narrative  . No narrative on file    Past Surgical History:  Procedure Laterality Date  . PEG PLACEMENT       Prescriptions Prior to Admission  Medication Sig Dispense Refill Last Dose  . acetaminophen (TYLENOL) 325 MG tablet Take 325 mg by mouth every 4 (four) hours as needed for mild pain, fever or headache.    PRN at PRN  . albuterol (PROVENTIL) (2.5 MG/3ML) 0.083% nebulizer solution Take 3 mLs (2.5 mg total) by nebulization  every 6 (six) hours as needed for wheezing. 75 mL 12 PRN at PRN  . chlorhexidine (PERIDEX) 0.12 % solution Use as directed 1 mL in the mouth or throat 2 (two) times daily.    01/31/2016 at 0800  . cholecalciferol (VITAMIN D) 1000 UNITS tablet Take 1,000 Units by mouth daily.   01/31/2016 at 0800  . ciprofloxacin (CIPRO) 250 MG tablet Take 250 mg by mouth 2 (two) times daily. For 10 days   01/31/2016 at 0800  . docusate sodium (COLACE) 100 MG capsule Take 100 mg by mouth 2 (two) times daily.   01/31/2016 at 0800  . doxazosin (CARDURA) 2 MG tablet Take 2 mg by mouth daily.   01/31/2016 at 0800  . ferrous sulfate  325 (65 FE) MG tablet Take 325 mg by mouth daily with breakfast.   01/31/2016 at 0800  . loperamide (IMODIUM) 2 MG capsule Take 2 mg by mouth 4 (four) times daily as needed for diarrhea or loose stools.   01/30/2016 at am  . Multiple Vitamins-Minerals (MULTIVITAMIN WITH IRON-MINERALS) liquid Take 5 mLs by mouth daily.    01/31/2016 at 0800  . Nutritional Supplements (FEEDING SUPPLEMENT, JEVITY 1.5 CAL,) LIQD Place 237 mLs into feeding tube 4 (four) times daily.   01/31/2016 at 1000  . omeprazole (PRILOSEC) 20 MG capsule Take 20 mg by mouth 2 (two) times daily.    01/31/2016 at 0800  . oxybutynin (DITROPAN) 5 MG tablet Take 5 mg by mouth 3 (three) times daily as needed for bladder spasms.   PRN at PRN  . Probiotic Product (4X PROBIOTIC PO) Take 1 capsule by mouth daily.    01/31/2016 at 0800  . senna (SENOKOT) 8.6 MG TABS tablet Place 2 tablets into feeding tube daily.   01/31/2016 at 0800  . sertraline (ZOLOFT) 50 MG tablet Take 50 mg by mouth daily.   01/31/2016 at 0800  . sodium bicarbonate 325 MG tablet Take 975 mg by mouth 3 (three) times daily.   01/31/2016 at 0800  . sulfamethoxazole-trimethoprim (BACTRIM,SEPTRA) 400-80 MG tablet Take 1 tablet by mouth 2 (two) times daily. For 10 days   01/31/2016 at am  . triamcinolone cream (KENALOG) 0.1 % Apply 1 application topically 2 (two) times daily.   01/31/2016 at 0800  . vitamin C (ASCORBIC ACID) 250 MG tablet Take 500 mg by mouth 2 (two) times daily.    01/31/2016 at 0800    Physical Exam: Blood pressure 117/72, pulse (!) 105, temperature 97.4 F (36.3 C), temperature source Axillary, resp. rate 19, height 6' (1.829 m), weight 66.2 kg (145 lb 15.1 oz), SpO2 95 %.   Wt Readings from Last 1 Encounters:  02/01/16 66.2 kg (145 lb 15.1 oz)     General appearance: cooperative and slowed mentation Head: Normocephalic, without obvious abnormality, atraumatic Resp: rhonchi bilaterally Cardio: regular rate and rhythm GI: soft, non-tender; bowel sounds normal;  no masses,  no organomegaly Extremities: edema 1-2+ Neurologic: Mental status: alertness: lethargic  Labs:   Lab Results  Component Value Date   WBC 9.9 02/01/2016   HGB 7.4 (L) 02/01/2016   HCT 22.1 (L) 02/01/2016   MCV 88.1 02/01/2016   PLT 251 02/01/2016    Recent Labs Lab 01/31/16 1956 02/01/16 0332  NA 138 138  K 4.5 4.5  CL 105 107  CO2 21* 21*  BUN 70* 71*  CREATININE 4.63* 4.56*  CALCIUM 8.0* 8.0*  PROT 6.4*  --   BILITOT 0.5  --   ALKPHOS 99  --  ALT 11*  --   AST 24  --   GLUCOSE 293* 157*   Lab Results  Component Value Date   CKMB 3.7 (H) 06/15/2014   TROPONINI 1.30 ([place]) 02/01/2016      Radiology: pulmonary edema EKG: nsr  ASSESSMENT AND PLAN:  8 o with a history of solitary kidney and low grade Ta bladder cancer with left renal pelvic mass s/p multiple ablations, renal insuffiency, slowed mentatin living in a group home now presenting with increasing sob, acute on chronic renal insuffiency and chf on cxr. EF is 25-30% by echo with moderate to severe mr. Has improved since admission with oxygen and diuresis. He is hemodynaically stable. Urology is seeing regarding his acute renal decompensation regarding possibe obstruction of his left renal stent that was scheduled to be revised next week. CHF is likely multifactoral to include cardiomyopathy and poor cardiac output with mr and ef of 25%. Will continue to diureses very judicially due to renal insuffiency. Increase troponin is likely multifactoral to include demand ischemia with the tachcardia and hypoxia on admission. Pt is anemic with hgb of 7/4. Not a candidate for anticoagulation. Consider transfusion. Poor prognosis. WIll follow with you.      Signed: Teodoro Spray MD, [facility] 02/01/2016, 12:48 PM

## 2016-02-01 NOTE — Progress Notes (Signed)
Pre Dialysis 

## 2016-02-01 NOTE — Progress Notes (Signed)
*  PRELIMINARY RESULTS* Echocardiogram 2D Echocardiogram has been performed.  Matthew Poole 02/01/2016, 12:59 PM

## 2016-02-01 NOTE — Progress Notes (Signed)
Pharmacy Antibiotic Note  Matthew Poole is a 75 y.o. male admitted on 01/31/2016 with sepsis likely secondary to UTI.  Pharmacy has been consulted for vancomycin and aztreonam dosing.  Plan: Pt was receiving vancomycin 750 mg IV q 48 and aztreonam 1 g IV q 8 hours. Pt has documented penicillin allergy (itching, N/V) but has had ceftriaxone in the past. Per MD, will deescalate patient to ceftriaxone, urine cultures pending.   Height: 6' (182.9 cm) Weight: 147 lb 14.9 oz (67.1 kg) IBW/kg (Calculated) : 77.6  Temp (24hrs), Avg:98 F (36.7 C), Min:97.4 F (36.3 C), Max:98.8 F (37.1 C)   Recent Labs Lab 01/31/16 1956 01/31/16 1958 01/31/16 2350 02/01/16 0332  WBC 11.0*  --   --  9.9  CREATININE 4.63*  --   --  4.56*  LATICACIDVEN  --  2.7* 2.0*  --     Estimated Creatinine Clearance: 13.7 mL/min (by C-G formula based on SCr of 4.56 mg/dL (H)).    Allergies  Allergen Reactions  . Penicillins Itching, Nausea And Vomiting, Rash and Other (See Comments)    Unable to obtain enough information to answer additional questions about this medication.      Antimicrobials this admission: Vanc 9/18 >> 9/19 Aztreonam 9/18 >> 9/19 Ceftriaxone 9/19 >>   Microbiology results: 09/18 BCx: No growth < 12 hrs 9/18 UCx: Sent  9/19 MRSA PCR: Positive  Thank you for allowing pharmacy to be a part of this patient's care.  Darrow Bussing, PharmD Pharmacy Resident 02/01/2016 3:18 PM

## 2016-02-01 NOTE — Progress Notes (Addendum)
[place] [facility] [place] NAME: Matthew Poole    MR#:  [id number]  DATE OF BIRTH:  07-22-1940  SUBJECTIVE:  CHIEF COMPLAINT:   Chief Complaint  Patient presents with  . Shortness of Breath    Came last nigth with SOB, found to have CHF, Ac on Ch renal failure, Hematuria and UTI ( he have chronic Urological issues) admitted to ICU care deut o requirement of bipap, but after few hours off bipap and so transferred to hospitalist care by Dr. Stevenson Clinch. Pt is not able to give ROS , but he is alert- have Mental issues. REVIEW OF SYSTEMS:  Unable to give ROS due to baseline mental status. ROS  DRUG ALLERGIES:   Allergies  Allergen Reactions  . Penicillins Itching, Nausea And Vomiting, Rash and Other (See Comments)    Unable to obtain enough information to answer additional questions about this medication.      VITALS:  Blood pressure 129/84, pulse (!) 112, temperature 98.8 F (37.1 C), temperature source Oral, resp. rate (!) 24, height 6' (1.829 m), weight 66.2 kg (145 lb 15.1 oz), SpO2 92 %.  PHYSICAL EXAMINATION:  GENERAL:  75 y.o.-year-old patient lying in the bed with no acute distress.  EYES: Pupils equal, round, reactive to light and accommodation. No scleral icterus. Extraocular muscles intact.  HEENT: Head atraumatic, normocephalic. Oropharynx and nasopharynx clear.  NECK:  Supple, no jugular venous distention. No thyroid enlargement, no tenderness.  LUNGS: Normal breath sounds bilaterally, some wheezing, b/l crepitation. positive use of accessory muscles of respiration.  CARDIOVASCULAR: S1, S2 normal. No murmurs, rubs, or gallops.  ABDOMEN: Soft, nontender, nondistended. Bowel sounds present. No organomegaly or mass.  EXTREMITIES: No pedal edema, cyanosis, or clubbing.  NEUROLOGIC: Cranial nerves II through XII are intact. Muscle strength 4/5 in all extremities. Sensation intact. Gait not checked. Moves limbs to stimuli, but does not  follow commands. PSYCHIATRIC: The patient is alert and not oriented.  SKIN: No obvious rash, lesion, or ulcer.   Physical Exam LABORATORY PANEL:   CBC  Recent Labs Lab 02/01/16 0332  WBC 9.9  HGB 7.4*  HCT 22.1*  PLT 251   ------------------------------------------------------------------------------------------------------------------  Chemistries   Recent Labs Lab 01/31/16 1956 02/01/16 0332  NA 138 138  K 4.5 4.5  CL 105 107  CO2 21* 21*  GLUCOSE 293* 157*  BUN 70* 71*  CREATININE 4.63* 4.56*  CALCIUM 8.0* 8.0*  MG  --  2.5*  AST 24  --   ALT 11*  --   ALKPHOS 99  --   BILITOT 0.5  --    ------------------------------------------------------------------------------------------------------------------  Cardiac Enzymes  Recent Labs Lab 01/31/16 2341 02/01/16 0332  TROPONINI 1.32* 1.30*   ------------------------------------------------------------------------------------------------------------------  RADIOLOGY:  Dg Chest Port 1 View  Result Date: 02/01/2016 CLINICAL DATA:  Respiratory failure. EXAM: PORTABLE CHEST 1 VIEW COMPARISON:  01/31/2016. FINDINGS: Cardiomegaly with diffuse bilateral pulmonary infiltrates consistent with pulmonary edema and bilateral pleural effusions again noted with minimal improvement. No pneumothorax. IMPRESSION: Persistent congestive heart failure bilateral pulmonary edema and small pleural effusions. Minimal interval change. Electronically Signed   By: Marcello Moores  Register   On: 02/01/2016 07:07   Dg Chest Port 1 View  Result Date: 01/31/2016 CLINICAL DATA:  Shortness of breath and decreased oxygen saturation on oxygen. Current treatment for urinary tract infection. Smoker. EXAM: PORTABLE CHEST 1 VIEW COMPARISON:  04/25/2015 FINDINGS: Cardiac enlargement with diffuse pulmonary vascular congestion. Bilateral perihilar airspace and diffuse interstitial disease consistent with  edema. Bilateral pneumonia also possible but felt less  likely due vascular congestion and cardiac enlargement. Small bilateral pleural effusions. No pneumothorax. IMPRESSION: Cardiac enlargement with pulmonary vascular congestion, diffuse bilateral pulmonary edema, and small pleural effusions. Electronically Signed   By: Lucienne Capers M.D.   On: 01/31/2016 21:16    ASSESSMENT AND PLAN:   Active Problems:   Acute respiratory failure (HCC)  * Ac hypoxic respi failure   Due to CHF exacerbation.   Due to renal failure, not to give lasix.    Called Cardio and nephro consult.    May have to check urine output, and may require lasix drip.  * Ac on ch renal failure, hematuria, UTI   Have chronic urological issues   Get renal US, urology and nephrology consult   IV vanc and azactam, cx sent.  * Sepsis   On presentation- lactic acid high, tachycardia, elevated WBcs   Due to UTI   Monitor.   Critical due to other complicating issues like renal failure and CHF.  * Acute CHF   Likely ac systolic    I could not find any previous echo result.    Get Echo, cardio consult.    Required bipap for short time.  * Acute on ch anemia, hematuira   Transfuse one unit PRBC per ICU today.  * elevated troponin   With hematuria, not a candidate for anticoagulation.   Likely this may be due to stress of CHF and renal failure with infection.   Echo and cardio consult.  Prognosis very poor and condition critical.   I tried calling legal guardian as per number listed in facesheet, left a voice mail to call back. I will like to update them about condition and get clear guideline about extent of treatment options and code status.   All the records are reviewed and case discussed with Care Management/Social Workerr. Management plans discussed with the patient, family and they are in agreement.  CODE STATUS: full  TOTAL TIME TAKING CARE OF THIS PATIENT: 40 critical care minutes.     POSSIBLE D/C IN 3-4 DAYS, DEPENDING ON CLINICAL  CONDITION.   Vaughan Basta M.D on 02/01/2016   Between 7am to 6pm - Pager - [phone]  After 6pm go to [email or web address] - password [email or web address] [facility]  Office  [phone]  CC: Primary care physician; [name], MD  Note: This dictation was prepared with Dragon dictation along with smaller phrase technology. Any transcriptional errors that result from this process are unintentional.

## 2016-02-01 NOTE — Progress Notes (Addendum)
Patient alert to self and place- pt at baseline per legal guardian Matthew Poole.  Pt had right IJ vas.cath placed- pulled back by Dr. Alva Garnet for correct position- per Dr. Alva Garnet may use for dialysis.  Patient having minimal urine output with hematuria- Dr. Candiss Norse was made aware this am.  Patient started on bolus tube feeds and tolerating well.  Patient received 1 unit of PRBC- per Dr. Stevenson Clinch- he will recheck in the am.  Patient having expiratory wheezing- now on scheduled duo-nebs- Dr. Stevenson Clinch made aware- Patient was on bipap earlier this am for a couple of hours - now he is on 4 liters tolerating well.

## 2016-02-01 NOTE — Progress Notes (Signed)
Update   Larrie Kass is the [facility] - he has been updated on the patient clinical status. He will visit patient later today with the [facility] paper work.  He also stated that patient is a DNR/DNI. Orders updated.   Vilinda Boehringer, MD [place] [facility] Pager [phone] (please enter 7-digits) On Call Pager - [phone] (please enter 7-digits)

## 2016-02-01 NOTE — Progress Notes (Signed)
Initial Nutrition Assessment  DOCUMENTATION CODES:   Not applicable  INTERVENTION:  -TF: recommend continuing Jevity 1.5 bolus feedings of 4 cans per day. If pt remains NPO during admission, will need to increase to 6 cans per day to better meet pt nutritional needs -Pt take oral diet at baseline but has hx of dysphagia and has been on thickened liquids in the past but is not compliant with this. May benefit from SLP evaluation prior to consideration of diet advancement on this admission   NUTRITION DIAGNOSIS:   Increased nutrient needs related to acute illness, catabolic illness as evidenced by estimated needs.  GOAL:   Patient will meet greater than or equal to 90% of their needs  MONITOR:   Diet advancement, TF tolerance, Labs, Weight trends  REASON FOR ASSESSMENT:   Rounds    ASSESSMENT:   75 yo male admitted with SOB with acute respiratory failure due to CHF exacerbation and acute on CRF requiring intiaition of temporary dialysis, acute on chronic anemia due to hematuria requiring PRBC. 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 Pt with hx of dysphagia and has PEG tube.  Pt resides in [place].   Spoke with Matthew Poole (pt's HCPOA/legal guardian), with his permission I also spoke with Matthew Poole (pt caregiver at group home). Per Matthew Poole, pt takes oral diet plus TF via PEG tube between meals. Pt eats regular consistency food (except no peas, beans, bacon or sausage) and is supposed to take thickened liquids (nectar thick) but pt refuses and sometimes sneaks thin liquids. Matthew Poole reports pt appetite is good, eats very well at breakfast (sometimes 2 meals) but not as much at dinner.  Pt also take 1 can of Jevity 1.5 4 times per day via  PEG tube between meals (provides 1422  kcals, 60 g protein). Pt has not had any problems tolerating TF; occasionally has some loose stools and [name] sometimes gives him imodium. Weight relatively stable; weight of 146 pounds on Friday (pt has home health nurse who visits him 2x a week).    Unable to complete Nutrition-Focused physical exam at this time.   Past Medical History:  Diagnosis Date  . Anemia   . Cancer (HCC)    renal cell carcinoma  . COPD (chronic obstructive pulmonary disease) ([place])   . Depression   . Difficulty walking   . Dysphagia, oropharyngeal   . G tube feedings ([place])   . GERD (gastroesophageal reflux disease)   . Mental retardation   . Muscle weakness   . Osteoporosis   . Psoriasis   . Psoriasis   . Reflux   . Renal cell carcinoma ([place])   . Renal disorder    acute on chronic renal disorder  . Sepsis secondary to UTI [facility])      Diet Order:  Diet NPO time specified  Skin:  Reviewed, no issues  Last BM:  9/19   Labs: Creatinine 4.56, phosphorus 5.3, magnesium 2.5, potassium wdl, elevated BNP  Meds: reviewed  Height:   Ht Readings from Last 1 Encounters:  02/01/16 6' (1.829 m)    Weight:   Wt Readings from Last 1 Encounters:  02/01/16 145 lb 15.1 oz (66.2 kg)    Wt Readings from Last 10 Encounters:  02/01/16 145 lb 15.1 oz (66.2 kg)  06/06/15 161 lb (  73 kg)  05/19/15 161 lb (73 kg)  04/25/15 161 lb (73 kg)  04/05/15 148 lb 14.4 oz (67.5 kg)  02/14/15 156 lb 11.9 oz (71.1 kg)  12/29/14 165 lb 4.8 oz (75 kg)    BMI:  Body mass index is 19.79 kg/m.  Estimated Nutritional Needs:   Kcal:  2000-2300 kcals  Protein:  100-130 g  Fluid:  per MD  EDUCATION NEEDS:   No education needs identified at this time  [place], [place], [place] [phone] Pager  [phone] Weekend/On-Call Pager

## 2016-02-01 NOTE — Progress Notes (Signed)
Dialysis started 

## 2016-02-01 NOTE — Progress Notes (Signed)
[facility]  Patient Status: Active  [facility] is providing the following services: SN/HHA  If patient discharges after hours, please call [phone].   Matthew Poole 02/01/2016, 8:56 AM

## 2016-02-02 ENCOUNTER — Inpatient Hospital Stay: Payer: PPO

## 2016-02-02 DIAGNOSIS — R7989 Other specified abnormal findings of blood chemistry: Secondary | ICD-10-CM

## 2016-02-02 DIAGNOSIS — C679 Malignant neoplasm of bladder, unspecified: Secondary | ICD-10-CM

## 2016-02-02 DIAGNOSIS — N179 Acute kidney failure, unspecified: Secondary | ICD-10-CM

## 2016-02-02 DIAGNOSIS — C642 Malignant neoplasm of left kidney, except renal pelvis: Secondary | ICD-10-CM

## 2016-02-02 DIAGNOSIS — Z66 Do not resuscitate: Secondary | ICD-10-CM

## 2016-02-02 DIAGNOSIS — Z515 Encounter for palliative care: Secondary | ICD-10-CM

## 2016-02-02 DIAGNOSIS — I5043 Acute on chronic combined systolic (congestive) and diastolic (congestive) heart failure: Secondary | ICD-10-CM

## 2016-02-02 LAB — GLUCOSE, CAPILLARY
GLUCOSE-CAPILLARY: 121 mg/dL — AB (ref 65–99)
GLUCOSE-CAPILLARY: 128 mg/dL — AB (ref 65–99)
Glucose-Capillary: 126 mg/dL — ABNORMAL HIGH (ref 65–99)
Glucose-Capillary: 155 mg/dL — ABNORMAL HIGH (ref 65–99)

## 2016-02-02 LAB — HEPATITIS B SURFACE ANTIGEN: Hepatitis B Surface Ag: NEGATIVE

## 2016-02-02 LAB — TYPE AND SCREEN
ABO/RH(D): A POS
ANTIBODY SCREEN: NEGATIVE
UNIT DIVISION: 0
Unit division: 0
Unit division: 0
Unit division: 0

## 2016-02-02 LAB — CBC
HCT: 25.1 % — ABNORMAL LOW (ref 40.0–52.0)
Hemoglobin: 8.2 g/dL — ABNORMAL LOW (ref 13.0–18.0)
MCH: 28.5 pg (ref 26.0–34.0)
MCHC: 32.7 g/dL (ref 32.0–36.0)
MCV: 87.1 fL (ref 80.0–100.0)
PLATELETS: 266 10*3/uL (ref 150–440)
RBC: 2.89 MIL/uL — AB (ref 4.40–5.90)
RDW: 14.5 % (ref 11.5–14.5)
WBC: 14.3 10*3/uL — AB (ref 3.8–10.6)

## 2016-02-02 LAB — BASIC METABOLIC PANEL
ANION GAP: 13 (ref 5–15)
BUN: 57 mg/dL — AB (ref 6–20)
CALCIUM: 8.1 mg/dL — AB (ref 8.9–10.3)
CO2: 25 mmol/L (ref 22–32)
CREATININE: 3.32 mg/dL — AB (ref 0.61–1.24)
Chloride: 103 mmol/L (ref 101–111)
GFR calc non Af Amer: 17 mL/min — ABNORMAL LOW (ref >60)
GFR, EST AFRICAN AMERICAN: 20 mL/min — AB (ref >60)
Glucose, Bld: 134 mg/dL — ABNORMAL HIGH (ref 65–99)
Potassium: 4.1 mmol/L (ref 3.5–5.1)
SODIUM: 141 mmol/L (ref 135–145)

## 2016-02-02 LAB — HEPATITIS B SURFACE ANTIBODY, QUANTITATIVE: Hepatitis B-Post: <3.1 m[IU]/mL — ABNORMAL LOW (ref >9.9)

## 2016-02-02 LAB — PREPARE RBC (CROSSMATCH)

## 2016-02-02 LAB — HEPATITIS B CORE ANTIBODY, TOTAL: HEP B C TOTAL AB: NEGATIVE

## 2016-02-02 LAB — PROCALCITONIN: Procalcitonin: 0.36 ng/mL

## 2016-02-02 MED ORDER — IPRATROPIUM-ALBUTEROL 0.5-2.5 (3) MG/3ML IN SOLN
3.0000 mL | Freq: Four times a day (QID) | RESPIRATORY_TRACT | Status: DC
  Administered 2016-02-03 – 2016-02-05 (×10): 3 mL via RESPIRATORY_TRACT
  Filled 2016-02-02 (×10): qty 3

## 2016-02-02 MED ORDER — FUROSEMIDE 10 MG/ML IJ SOLN
40.0000 mg | Freq: Once | INTRAMUSCULAR | Status: AC
  Administered 2016-02-02: 40 mg via INTRAVENOUS
  Filled 2016-02-02: qty 4

## 2016-02-02 NOTE — Progress Notes (Signed)
Nutrition Follow-up  DOCUMENTATION CODES:   Not applicable  INTERVENTION:  -Continue Jevity 1.5 x4, if unable to advance diet by tomorrow, increase to 6 cans per day. -Currently provides 1420 calories, 60 gm protein, and 720cc free water -Free water per MD/NP/PA  NUTRITION DIAGNOSIS:   Increased nutrient needs related to acute illness, catabolic illness as evidenced by estimated needs. -ongoing  GOAL:   Patient will meet greater than or equal to 90% of their needs -not meeting currently  MONITOR:   Diet advancement, TF tolerance, Labs, Weight trends  REASON FOR ASSESSMENT:   Rounds    ASSESSMENT:   75 yo male admitted with SOB with acute respiratory failure due to CHF exacerbation and acute on CRF requiring intiaition of temporary dialysis, acute on chronic anemia due to hematuria requiring PRBC. 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 Pt with hx of dysphagia and has PEG tube.   Alert, able to follow commands today Tolerating tf's, no complaints Still NPO Wt down 2 kgs Labs and medications reviewed: CBGs 126, Phos 5.3, Mg 2.5 Prednisone  Diet Order:  Diet NPO time specified  Skin:  Reviewed, no issues  Last BM:  9/19  Height:   Ht Readings from Last 1 Encounters:  02/01/16 6' (1.829 m)    Weight:   Wt Readings from Last 1 Encounters:  02/02/16 143 lb 1.3 oz (64.9 kg)    Ideal Body Weight:     BMI:  Body mass index is 19.4 kg/m.  Estimated Nutritional Needs:   Kcal:  2000-2300 kcals  Protein:  100-130 g  Fluid:  per MD  EDUCATION NEEDS:   No education needs identified at this time  Satira Anis. Roshni Burbano, MS, RD LDN Inpatient Clinical Dietitian Pager [phone]

## 2016-02-02 NOTE — NC FL2 (Signed)
[place] LEVEL OF CARE SCREENING TOOL     IDENTIFICATION  Patient Name: Matthew Poole Birthdate: [birth year] Sex: male Admission Date (Current Location): 01/31/2016  [place] and [place] Number:  Engineering geologist and Address:  [facility], [address] [place], [place] [address]      Provider Number: [id number]  Attending Physician Name and Address:  Vaughan Basta, MD  Relative Name and Phone Number:       Current Level of Care: Hospital Recommended Level of Care: [place] Prior Approval Number:    Date Approved/Denied:   PASRR Number:    Discharge Plan: Domiciliary (Rest home)    Current Diagnoses: Patient Active Problem List   Diagnosis Date Noted  . Acute respiratory failure ([place]) 01/31/2016  . Malnutrition of moderate degree 04/06/2015  . UTI (lower urinary tract infection) 04/05/2015  . Renal failure 04/05/2015  . Sepsis secondary to UTI ([place]) 12/25/2014  . Sepsis ([place]) 12/25/2014    Orientation RESPIRATION BLADDER Height & Weight     Place  O2 (4 Liters Oxygen ) Continent Weight: 143 lb 1.3 oz (64.9 kg) Height:  6' (182.9 cm)  BEHAVIORAL SYMPTOMS/MOOD NEUROLOGICAL BOWEL NUTRITION STATUS   (none )  (none) Continent Diet (NPO )  AMBULATORY STATUS COMMUNICATION OF NEEDS Skin   Limited Assist Verbally Normal                       Personal Care Assistance Level of Assistance  Bathing, Feeding, Dressing Bathing Assistance: Limited assistance Feeding assistance: Independent Dressing Assistance: Limited assistance     Functional Limitations Info  Sight, Hearing, Speech Sight Info: Adequate Hearing Info: Adequate Speech Info: Adequate    SPECIAL CARE FACTORS FREQUENCY                       Contractures      Additional Factors Info  Code Status, Allergies, Psychotropic, Insulin Sliding Scale, Isolation Precautions Code Status Info:  (DNR ) Allergies Info:  (Penicillins  ) Psychotropic Info:  (Zoloft) Insulin Sliding Scale Info:  (NovoLog ) Isolation Precautions Info:  (MRSA )     Current Medications (02/02/2016):  This is the current hospital active medication list Current Facility-Administered Medications  Medication Dose Route Frequency Provider Last Rate Last Dose  . 0.9 %  sodium chloride infusion  10 mL/hr Intravenous Once Joanne Gavel, MD      . 0.9 %  sodium chloride infusion  250 mL Intravenous PRN Bincy S Varughese, NP      . cefTRIAXone (ROCEPHIN) 1 g in dextrose 5 % 50 mL IVPB  1 g Intravenous Q24H Vaughan Basta, MD   1 g at 02/01/16 1743  . chlorhexidine (PERIDEX) 0.12 % solution 15 mL  15 mL Mouth Rinse BID Bincy S Varughese, NP   15 mL at 02/02/16 1230  . famotidine (PEPCID) IVPB 20 mg premix  20 mg Intravenous Q24H Bincy S Varughese, NP   20 mg at 02/01/16 2309  . feeding supplement (JEVITY 1.5 CAL/FIBER) liquid 237 mL  237 mL Per Tube QID Bincy S Varughese, NP   237 mL at 02/02/16 1015  . insulin aspart (novoLOG) injection 2-6 Units  2-6 Units Subcutaneous QID Holley Raring, NP   2 Units at 02/02/16 1230  . ipratropium-albuterol (DUONEB) 0.5-2.5 (3) MG/3ML nebulizer solution 3 mL  3 mL Nebulization Q4H Bincy S Varughese, NP   3 mL at 02/02/16 1323  .  ipratropium-albuterol (DUONEB) 0.5-2.5 (3) MG/3ML nebulizer solution 3 mL  3 mL Nebulization Q4H PRN Bincy S Varughese, NP   3 mL at 02/02/16 0314  . MEDLINE mouth rinse  15 mL Mouth Rinse q12n4p Bincy S Varughese, NP   15 mL at 02/01/16 1600  . predniSONE (DELTASONE) tablet 30 mg  30 mg Oral Q breakfast Vishal Mungal, MD   30 mg at 02/02/16 0801  . sertraline (ZOLOFT) tablet 50 mg  50 mg Oral Daily Bincy S Varughese, NP   50 mg at 02/02/16 1229     Discharge Medications: Please see discharge summary for a list of discharge medications.  Relevant Imaging Results:  Relevant Lab Results:   Additional Information  (SSN: [id number])  Hagen Tidd, Veronia Beets, LCSW

## 2016-02-02 NOTE — Care Management Note (Signed)
Case Management Note  Patient Details  Name: Matthew Poole MRN: [id number] Date of Birth: July 23, 1940  Subjective/Objective:                  Message left for patient's guardian  Larrie Kass Legal Guardian [phone]   To contact this RNCM for assessment of discharge needs. I spoke with Edwena Bunde at [facility] [phone] and she states that patient has been at this facility for 20+ years. He was able to work up until kidney injury 2-3 years ago per Berenice Primas. He has a walker but refuses to use it; "he is stubborn".  He is not on O2 at facility. He does have a nebulizer. His PCP is Dr. Ginette Pitman. [facility] for [place]- resumption orders will be needed at discharge and PT consult would be beneficial. PEG feeds provided through [facility].   Action/Plan: Corene Cornea with [facility] is aware of patient admission and potential need for home O2. RNCM will continue to follow along with CSW.    Expected Discharge Date:                  Expected Discharge Plan:     In-House Referral:  Clinical Social Work  Discharge planning Services  CM Consult  Post Acute Care Choice:  Home Health, Durable Medical Equipment Choice offered to:  [facility] POA / Guardian  DME Arranged:    DME Agency:  [place] Arranged:  RN, PT [place] Agency:  [place]  Status of Service:  In process, will continue to follow  If discussed at Long Length of Stay Meetings, dates discussed:    Additional Comments:  Marshell Garfinkel, RN 02/02/2016, 1:44 PM

## 2016-02-02 NOTE — Clinical Social Work Note (Signed)
Clinical Social Work Assessment  Patient Details  Name: Matthew Poole MRN: [id number] Date of Birth: 1941/02/09  Date of referral:  02/02/16               Reason for consult:  Other (Comment Required) (from [facility]. )                Permission sought to share information with:    Permission granted to share information::     Name::        Agency::     Relationship::     Contact Information:     Housing/Transportation Living arrangements for the past 2 months:  Group Home Source of Information:  Facility Patient Interpreter Needed:  None Criminal Activity/Legal Involvement Pertinent to Current Situation/Hospitalization:  No - Comment as needed Significant Relationships:  Friend Lives with:  Facility Resident Do you feel safe going back to the place where you live?    Need for family participation in patient care:  Yes (Comment)  Care giving concerns:  Patient lives at [facility] located at [address] [place], [place] [address]. (Phone and Fax: [phone])      Social Worker assessment / plan:  Holiday representative (CSW) received consult that patient is from [facility]. Per chart patient is not alert and oriented and has a guardian. CSW attempted to contact patient's guardian Denyse Amass and left him a Advertising account executive. CSW contacted [facility] and spoke with staff member Berenice Primas. Per Berenice Primas patient has been a resident at the family care home for 20 plus years. Per Berenice Primas patient's guardian Denyse Amass comes to visit patient on a weekly basis and is a family friend. Per Berenice Primas patient has a walker however he refuses to use it. Per Berenice Primas patient was receiving PT through [facility]. Berenice Primas reported that patient is on room air at baseline and has a nebulizer machine at the facility. Berenice Primas also reported that patient is not on dialysis at baseline. Per Georgena Spurling Ray is the family care home owner. CSW attempted to contact Lawanda however she did  not answer and a voicemail was left. CSW will continue to follow and assist as needed.      Employment status:  Disabled (Comment on whether or not currently receiving Disability) Insurance information:  Managed Medicare PT Recommendations:  Not assessed at this time Information / Referral to community resources:  Other (Comment Required) (Return to [facility] or to a higher level of care which will be determined later. )  Patient/Family's Response to care:  CSW left a voicemail for patient's guardian Denyse Amass.   Patient/Family's Understanding of and Emotional Response to Diagnosis, Current Treatment, and Prognosis:  Patient is not alert and oriented per chart. CSW left a voicemail for patient's guardian.   Emotional Assessment Appearance:  Appears stated age Attitude/Demeanor/Rapport:  Unable to Assess Affect (typically observed):  Unable to Assess Orientation:  Oriented to Self Alcohol / Substance use:  Not Applicable Psych involvement (Current and /or in the community):  No (Comment)  Discharge Needs  Concerns to be addressed:  Discharge Planning Concerns Readmission within the last 30 days:  No Current discharge risk:  Chronically ill, Cognitively Impaired Barriers to Discharge:  Continued Medical Work up   [employer], Veronia Beets, LCSW 02/02/2016, 2:40 PM

## 2016-02-02 NOTE — Progress Notes (Signed)
PULMONARY / CRITICAL CARE MEDICINE   Name: Matthew Poole MRN: [id number] DOB: March 18, 1941    ADMISSION DATE:  01/31/2016 CONSULTATION DATE:  01/31/16  REFERRING MD:  Dr. Edd Fabian  CHIEF COMPLAINT:  Acute Shortness of breath  DISCUSSION: 75 year old male with known history of renal carcinoma presents with acute shortness of breath possibly related to pulmonary edema,bilateral pleural effusion and major cardiomegaly. a Patient is actively bleeding from foley catheter. Patient is placed on BiPAP. Seems comfortable now. On 4l n/c. Doing well.  SUBJECTIVE:  Had an uneventful night. Rested comfortably.  VITAL SIGNS: BP 108/72   Pulse (!) 110   Temp 97.5 F (36.4 C) (Axillary)   Resp (!) 23   Ht 6' (1.829 m)   Wt 65 kg (143 lb 4.8 oz)   SpO2 (!) 89%   BMI 19.43 kg/m   HEMODYNAMICS:    VENTILATOR SETTINGS: FiO2 (%):  [35 %] 35 %  INTAKE / OUTPUT: I/O last 3 completed shifts: In: 2164 [I.V.:10; Blood:280; NG/GT:474; IV Piggyback:1400] Out: [id number] [Urine:300; Other:2000]  PHYSICAL EXAMINATION: General:  Elderly white male, appears with respiratory accessory muscle use Neuro:  Alert, oriented x2, follows commands HEENT:  Atraumatic, normocephalic, no JVD Cardiovascular:  S1, S2, tachycardic, regular rhythm, no murmurs/gallops/rubs noted Lungs:  Inspiratory and expiratory wheezing noted, no crackles/rhonchi noted.  Accessory muscle use. Mild Tachypnea Abdomen:  Soft, nontender, nondistended  Musculoskeletal: No inflammation/deformy noted.  No cyanosis, clubbing, edema.  Active ROM all extremities   Skin:  Grossly intact, no apparent lesions, rashes, or ulcers.  LABS:  BMET  Recent Labs Lab 01/31/16 1956 02/01/16 0332 02/01/16 1430  NA 138 138 137  K 4.5 4.5 5.1  CL 105 107 104  CO2 21* 21* 21*  BUN 70* 71* 81*  CREATININE 4.63* 4.56* 4.51*  GLUCOSE 293* 157* 197*    Electrolytes  Recent Labs Lab 01/31/16 1956 02/01/16 0332 02/01/16 1430  CALCIUM 8.0* 8.0*  8.1*  MG  --  2.5*  --   PHOS  --  5.3*  --     CBC  Recent Labs Lab 01/31/16 1956 02/01/16 0332  WBC 11.0* 9.9  HGB 7.7* 7.4*  HCT 24.2* 22.1*  PLT 263 251    Coag's  Recent Labs Lab 01/31/16 1956  APTT 42*  INR 1.34    Sepsis Markers  Recent Labs Lab 01/31/16 1958 01/31/16 2350 02/01/16 0332  LATICACIDVEN 2.7* 2.0*  --   PROCALCITON  --   --  0.27    ABG  Recent Labs Lab 01/31/16 2210  PHART 7.42  PCO2ART 31*  PO2ART 338*    Liver Enzymes  Recent Labs Lab 01/31/16 1956  AST 24  ALT 11*  ALKPHOS 99  BILITOT 0.5  ALBUMIN 2.5*    Cardiac Enzymes  Recent Labs Lab 01/31/16 2341 02/01/16 0332 02/01/16 1138  TROPONINI 1.32* 1.30* 1.32*    Glucose  Recent Labs Lab 02/01/16 0344 02/01/16 0807 02/01/16 1407 02/01/16 1611 02/01/16 2226 02/02/16 0433  GLUCAP 153* 109* 218* 178* 127* 121*    Imaging US Renal  Result Date: 02/01/2016 CLINICAL DATA:  Right nephrectomy.  Renal failure. EXAM: RENAL / URINARY TRACT ULTRASOUND COMPLETE COMPARISON:  KUB 05/19/2015.  CT 06/14/2014 . FINDINGS: Right Kidney: Right nephrectomy. Left Kidney: Length: 10.7 cm. Left lower renal pole is inhomogeneous with indistinct corticomedullary junction. A questionable ill-defined mass noted in the lower pole the left kidney. No hydronephrosis. Bladder: Appears normal for degree of bladder distention. Left pleural effusion is noted. IMPRESSION:  1. Possible left lower renal pole mass lesion. Gadolinium-enhanced renal MRI suggested for further evaluation. 2.  Right nephrectomy. 3.  Left pleural effusion. Electronically Signed   By: Marcello Moores  Register   On: 02/01/2016 14:52   Dg Chest Port 1 View  Result Date: 02/01/2016 CLINICAL DATA:  Encounter for central line placement. EXAM: PORTABLE CHEST 1 VIEW COMPARISON:  02/01/2016 at 4:15 a.m. FINDINGS: There is a new right internal jugular dual-lumen central venous catheter. The distal catheter tip projects in the right  atrium. No pneumothorax. No change in the bilateral interstitial airspace lung opacities consistent with pulmonary edema. IMPRESSION: 1. Right internal jugular dual-lumen central venous catheter. Distal tip projects in the right atrium. No pneumothorax. Electronically Signed   By: Lajean Manes M.D.   On: 02/01/2016 12:38     STUDIES:  9/19 Echocardiogram>>Left ventricle: Systolic function was severely reduced. The  estimated ejection fraction was in the range of 25% to 30%. Aortic valve: There was mild regurgitation. Valve area (Vmax): 2.95 cm^2. Mitral valve: There was moderate to severe regurgitation.  9/19 Renal US>>Possible left lower renal pole mass lesion. Gadolinium-enhanced renal MRI suggested for further evaluation.  CULTURES: 9/18 Urine culture>> 9/18 Blood culture>> 9/19 MRSA by PCR>>positive   ANTIBIOTICS: 9/18 Vancomycin>> 9/18 Aztreonam>> 9/19 Ceftriaxone>>  SIGNIFICANT EVENTS: 69/71>> 75 year old male with renal carcinoma admitted to the ICU on BiPAP due to pulmonary edema/?sepsis related to UTI.  9/19>> off BiPAP transitioned to [place]  LINES/TUBES: 9/19>>RIJ Vascath  ASSESSMENT / PLAN:  PULMONARY A: Acute respiratory failure possibly related to pulmonary edema Hx of COPD P:   Continue to support with oxygen to keep sats greater than 94%  DuoNeb PRN and scheduled Prednisone taper CXR unchaged  CARDIOVASCULAR A:  CHF, BNP >4500 Cardiomegaly Elevated troponins r/t demand ischemia ST depression in V4, V5, V6.   P:  9/19 Echo>>Left ventricle: Systolic function was severely reduced. The  estimated ejection fraction was in the range of 25% to 30%.  Cardiology following Trend troponin -not a candidate to receive heparin per cardiology due to bleeding.  RENAL A:   Acute on chronic renal disorder Renal cell carcinoma Chronic Hematuria  P:   9/19 Received HD 9/19 RIJ Vascath placed Nephrology following Replace electrolytes per ICU protocol Avoid  nephrotoxic drugs  GASTROINTESTINAL A:   Indwelling G-tube History of GERD P:   Nothing by mouth for now continue famotidine Bolus tube  feeds  HEMATOLOGIC A:   Acute blood loss anemia P:  9/19 Received 1 unit PRBCs teds and SCD's Transfuse per ICU protocol Trend H/H  INFECTIOUS A:   Sepsis possibly related to UTI, UA with pyuria Leukocytosis P:   Monitor fever curve Vancomycin/aztreonam/Ceftriaxone Follow cultures  ENDOCRINE A:   Hyperglycemia P:   Blood sugar checks every 4 hours SSI Coverage  NEUROLOGIC A:   History of mental retardation P:   Minimize sedating drugs Continue sertaline  Bincy Varughese,AG-ACNP Pulmonary and [place]   02/02/2016, 5:26 AM   STAFF NOTE: I, Dr. Vilinda Boehringer have personally reviewed patient's available data, including medical history, events of note, physical examination and test results as part of my evaluation. I have discussed with NP Demaris Callander  and other care providers such as pharmacist, RN and RRT.    Patient stable today, noted to have one session of HD yesterday.  Pulmonary status clinically improved after HD, labs and cxr with no significant improvement.    A:74 yo with complex medical history (CKD III,  RCC, Bladder Ca), now with acute on chronic renal failure, with respiratory distress, s/p bipap and HD, now with significant pulmonary improvement.   Respiratory distress - improved Acute on chronic renal failure RCC Bladder Ca Hx of L ureteral stent Acute CHF - diastolic ([id number])   P:  - maintain O2>88%, no further need for bipap - cont with BP management and CHF management. - U\S renal with no hydronephrosis, no indication nephrostomy tubes.  - Urology, nephro and cards following along - has follow up with [facility] on 9/25 for L Ureteral stent change and cancer evaluation  .  Rest per NP/medical resident whose note is outlined above and that I  agree with  PCCM will follow peripherally.   Critical Care Time devoted to patient care services described in this note is  35 Minutes.   This time reflects time of care of this signee Dr Vilinda Boehringer.  This critical care time does not reflect procedure time, or teaching time or supervisory time of PA/NP/Med-student/Med Resident etc but could involve care discussion time.  Vilinda Boehringer, MD  Pulmonary and Critical Care Pager [phone] (please enter 7-digits) On Call Pager [phone] (please enter 7-digits)  Note: This note was prepared with Dragon dictation along with smaller phrase technology. Any transcriptional errors that result from this process are unintentional.

## 2016-02-02 NOTE — Consult Note (Signed)
Consultation Note Date: 02/02/2016   Patient Name: Matthew Poole  DOB: 1940/09/04  MRN: [id number]  Age / Sex: 75 y.o., male  PCP: [name], MD Referring Physician: Vaughan Basta, MD  Reason for Consultation: Establishing goals of care  HPI/Patient Profile: 75 y.o. male  with past medical history of renal cell carcinoma s/p multiple ablations and right nephrectomy, chronic indwelling catheter, psoriasis, COPD, CHF EF 25-50%., CKD stage III, GERD, mental retardation and dysphagia, s/p PEG admitted on 01/31/2016 with shortness of breath and acute renal failure requiring BiPAP at admission and one hemodialysis treatment 9/19 and received 1 unit PRBC 9/19 (chronic hematuria).   Clinical Assessment and Goals of Care: I met today with Matthew Poole and he is sitting up in chair. He is content and has no complaints. No signs of pain/discomfort. Urine remains bloody. NT says that he required little assistance to get from bed to chair. Matthew Poole will answer basic yes/no questions but otherwise not very talkative.   I spoke more with his POA and surrogate decision maker [name]. [name] was a neighbor of Matthew Poole and took over his care when Matthew Poole's mother died after she requested him to do so as no other family members would take responsibility of him. [name] has been looking after him for 35 yrs now and says that Matthew Poole's health has declined over the past 3-4 yrs ans especially in the past 1-2 yrs. He describes Rafiq's QOL as very poor and says "he is just existing." He also says that a year ago they thought that Nilay was going to die at [facility] when he has nephrectomy and 2 episodes of aspiration pneumonia (this is when he received PEG).   [name] agrees that Pharell should not go through any aggressive surgeries or interventions (no long term dialysis, aggressive surgery). He wishes to treat the treatable to  maintain Matthew Poole as well as we are able. Confirmed DNR. We did discuss that he will likely continue to decline with multiple comorbitities of progressive disease processes. I introduced the idea of hospice. [name] is familiar with hospice as they helped with his wife when she died earlier this year. [name] would be open to hospice for Chevis with worsening health in the future.   All questions/concerns addressed. Therapeutic listening and emotional support provided.   LEGAL Darleen Crocker [phone]  Code Status/Advance Care Planning:  DNR   Symptom Management:   Hematuria: Per urology - followed by [facility] urology Dr. Elyse Hsu  Agitation is at baseline. He does not seem agitated at time of my visit. [name] says that Beryle is "short-tempered" and is irritable typically until after 10 am in the mornings.   Palliative Prophylaxis:   Aspiration, Bowel Regimen, Delirium Protocol and Frequent Pain Assessment   Psycho-social/Spiritual:   Desire for further Chaplaincy support:no  Additional Recommendations: Caregiving  Support/Resources  Prognosis:   Unable to determine - with worsening comorbitities prognosis poor. Risk of acute decline.   Discharge Planning: Return to [place] with home  health. Could likely benefit from outpatient palliative care.       Primary Diagnoses: Present on Admission: . Acute respiratory failure ([place])   I have reviewed the medical record, interviewed the patient and family, and examined the patient. The following aspects are pertinent.  Past Medical History:  Diagnosis Date  . Anemia   . Cancer (HCC)    renal cell carcinoma  . COPD (chronic obstructive pulmonary disease) ([place])   . Depression   . Difficulty walking   . Dysphagia, oropharyngeal   . G tube feedings ([place])   . GERD (gastroesophageal reflux disease)   . Mental retardation   . Muscle weakness   . Osteoporosis   . Psoriasis   . Psoriasis   . Reflux   .  Renal cell carcinoma ([place])   . Renal disorder    acute on chronic renal disorder  . Sepsis secondary to UTI [facility])    Social History   Social History  . Marital status: Unknown    Spouse name: N/A  . Number of children: N/A  . Years of education: N/A   Social History Main Topics  . Smoking status: Current Every Day Smoker    Packs/day: 0.50    Types: Cigarettes  . Smokeless tobacco: Never Used  . Alcohol use No  . Drug use: No  . Sexual activity: Not Asked   Other Topics Concern  . None   Social History Narrative  . None   Family History  Problem Relation Age of Onset  . Family history unknown: Yes   Scheduled Meds: . sodium chloride  10 mL/hr Intravenous Once  . cefTRIAXone (ROCEPHIN)  IV  1 g Intravenous Q24H  . chlorhexidine  15 mL Mouth Rinse BID  . famotidine (PEPCID) IV  20 mg Intravenous Q24H  . feeding supplement (JEVITY 1.5 CAL/FIBER)  237 mL Per Tube QID  . furosemide  40 mg Intravenous Once  . insulin aspart  2-6 Units Subcutaneous QID  . ipratropium-albuterol  3 mL Nebulization Q4H  . mouth rinse  15 mL Mouth Rinse q12n4p  . predniSONE  30 mg Oral Q breakfast  . sertraline  50 mg Oral Daily   Continuous Infusions:  PRN Meds:.sodium chloride, ipratropium-albuterol Medications Prior to Admission:  Prior to Admission medications   Medication Sig Start Date End Date Taking? Authorizing Provider  acetaminophen (TYLENOL) 325 MG tablet Take 325 mg by mouth every 4 (four) hours as needed for mild pain, fever or headache.    Yes Historical Provider, MD  albuterol (PROVENTIL) (2.5 MG/3ML) 0.083% nebulizer solution Take 3 mLs (2.5 mg total) by nebulization every 6 (six) hours as needed for wheezing. 04/07/15  Yes Richard Leslye Peer, MD  chlorhexidine (PERIDEX) 0.12 % solution Use as directed 1 mL in the mouth or throat 2 (two) times daily.    Yes Historical Provider, MD  cholecalciferol (VITAMIN D) 1000 UNITS tablet Take 1,000 Units by mouth daily.   Yes Historical  Provider, MD  ciprofloxacin (CIPRO) 250 MG tablet Take 250 mg by mouth 2 (two) times daily. For 10 days 01/24/16  Yes Historical Provider, MD  docusate sodium (COLACE) 100 MG capsule Take 100 mg by mouth 2 (two) times daily.   Yes Historical Provider, MD  doxazosin (CARDURA) 2 MG tablet Take 2 mg by mouth daily.   Yes Historical Provider, MD  ferrous sulfate 325 (65 FE) MG tablet Take 325 mg by mouth daily with breakfast.   Yes Historical Provider, MD  loperamide (IMODIUM) 2  MG capsule Take 2 mg by mouth 4 (four) times daily as needed for diarrhea or loose stools.   Yes Historical Provider, MD  Multiple Vitamins-Minerals (MULTIVITAMIN WITH IRON-MINERALS) liquid Take 5 mLs by mouth daily.    Yes Historical Provider, MD  Nutritional Supplements (FEEDING SUPPLEMENT, JEVITY 1.5 CAL,) LIQD Place 237 mLs into feeding tube 4 (four) times daily.   Yes Historical Provider, MD  omeprazole (PRILOSEC) 20 MG capsule Take 20 mg by mouth 2 (two) times daily.    Yes Historical Provider, MD  oxybutynin (DITROPAN) 5 MG tablet Take 5 mg by mouth 3 (three) times daily as needed for bladder spasms.   Yes Historical Provider, MD  Probiotic Product (4X PROBIOTIC PO) Take 1 capsule by mouth daily.    Yes Historical Provider, MD  senna (SENOKOT) 8.6 MG TABS tablet Place 2 tablets into feeding tube daily.   Yes Historical Provider, MD  sertraline (ZOLOFT) 50 MG tablet Take 50 mg by mouth daily.   Yes Historical Provider, MD  sodium bicarbonate 325 MG tablet Take 975 mg by mouth 3 (three) times daily.   Yes Historical Provider, MD  sulfamethoxazole-trimethoprim (BACTRIM,SEPTRA) 400-80 MG tablet Take 1 tablet by mouth 2 (two) times daily. For 10 days 01/26/16  Yes Historical Provider, MD  triamcinolone cream (KENALOG) 0.1 % Apply 1 application topically 2 (two) times daily.   Yes Historical Provider, MD  vitamin C (ASCORBIC ACID) 250 MG tablet Take 500 mg by mouth 2 (two) times daily.    Yes Historical Provider, MD   Allergies    Allergen Reactions  . Penicillins Itching, Nausea And Vomiting, Rash and Other (See Comments)    Unable to obtain enough information to answer additional questions about this medication.     Review of Systems  Unable to perform ROS: Other  Genitourinary: Frequency:         Physical Exam  Constitutional: He appears well-developed.  Thin, frail  HENT:  Head: Normocephalic and atraumatic.  Cardiovascular: Tachycardia present.   Pulmonary/Chest: No accessory muscle usage. No tachypnea. No respiratory distress. He has wheezes.  Abdominal: Soft. Normal appearance.  + PEG  Neurological: He is alert.  Disoriented at baseline (h/o mental delay)    Vital Signs: BP 112/79 (BP Location: Left Arm)   Pulse (!) 109   Temp 98.1 F (36.7 C) (Oral)   Resp (!) 25   Ht 6' (1.829 m)   Wt 64.9 kg (143 lb 1.3 oz)   SpO2 92%   BMI 19.40 kg/m  Pain Assessment: No/denies pain POSS *See Group Information*: S-Acceptable,Sleep, easy to arouse Pain Score: 0-No pain   SpO2: SpO2: 92 % O2 Device:SpO2: 92 % O2 Flow Rate: .O2 Flow Rate (L/min): 4 L/min  IO: Intake/output summary:  Intake/Output Summary (Last 24 hours) at 02/02/16 1114 Last data filed at 02/02/16 0300  Gross per 24 hour  Intake              704 ml  Output             2400 ml  Net            -1696 ml    LBM: Last BM Date: 02/02/16 Baseline Weight: Weight: 72.6 kg (160 lb) Most recent weight: Weight: 64.9 kg (143 lb 1.3 oz)     Palliative Assessment/Data: PPS: 40%     Time In: 1540 Time Out: 1630 Time Total: 75mn Greater than 50%  of this time was spent counseling and coordinating care related to  the above assessment and plan.  Signed by: Pershing Proud, NP   Please contact Palliative Medicine Team phone at [phone] for questions and concerns.  For individual provider: See Shea Evans

## 2016-02-02 NOTE — Progress Notes (Signed)
[facility] [place] NAME: Matthew Poole    MR#:  [id number]  DATE OF BIRTH:  08/09/1940  SUBJECTIVE:  CHIEF COMPLAINT:   Chief Complaint  Patient presents with  . Shortness of Breath    Came last nigth with SOB, found to have CHF, Ac on Ch renal failure, Hematuria and UTI ( he have chronic Urological issues) admitted to ICU care deut o requirement of bipap, but after few hours off bipap and so transferred to hospitalist care by Dr. Stevenson Clinch. Pt is not able to give ROS , but he is alert- have Mental issues. Required One HD session on 02/01/16, much improved in clinical status, sitting comfortably today. REVIEW OF SYSTEMS:  Unable to give ROS due to baseline mental status. ROS  DRUG ALLERGIES:   Allergies  Allergen Reactions  . Penicillins Itching, Nausea And Vomiting, Rash and Other (See Comments)    Unable to obtain enough information to answer additional questions about this medication.      VITALS:  Blood pressure 103/76, pulse (!) 108, temperature 97.8 F (36.6 C), temperature source Oral, resp. rate 18, height 6' (1.829 m), weight 64.9 kg (143 lb 1.3 oz), SpO2 97 %.  PHYSICAL EXAMINATION:  GENERAL:  75 y.o.-year-old patient lying in the bed with no acute distress.  EYES: Pupils equal, round, reactive to light and accommodation. No scleral icterus. Extraocular muscles intact.  HEENT: Head atraumatic, normocephalic. Oropharynx and nasopharynx clear.  NECK:  Supple, no jugular venous distention. No thyroid enlargement, no tenderness.  LUNGS: Normal breath sounds bilaterally, some wheezing, b/l crepitation. No use of accessory muscles of respiration.  CARDIOVASCULAR: S1, S2 normal. No murmurs, rubs, or gallops.  ABDOMEN: Soft, nontender, nondistended. Bowel sounds present. No organomegaly or mass.  EXTREMITIES: No pedal edema, cyanosis, or clubbing.  NEUROLOGIC: Cranial nerves II through XII are intact. Muscle strength 4/5 in all  extremities. Sensation intact. Gait not checked.  PSYCHIATRIC: The patient is alert and not oriented. He answers simple questions. SKIN: No obvious rash, lesion, or ulcer.   Physical Exam LABORATORY PANEL:   CBC  Recent Labs Lab 02/02/16 0330  WBC 14.3*  HGB 8.2*  HCT 25.1*  PLT 266   ------------------------------------------------------------------------------------------------------------------  Chemistries   Recent Labs Lab 01/31/16 1956 02/01/16 0332  02/02/16 0330  NA 138 138  < > 141  K 4.5 4.5  < > 4.1  CL 105 107  < > 103  CO2 21* 21*  < > 25  GLUCOSE 293* 157*  < > 134*  BUN 70* 71*  < > 57*  CREATININE 4.63* 4.56*  < > 3.32*  CALCIUM 8.0* 8.0*  < > 8.1*  MG  --  2.5*  --   --   AST 24  --   --   --   ALT 11*  --   --   --   ALKPHOS 99  --   --   --   BILITOT 0.5  --   --   --   < > = values in this interval not displayed. ------------------------------------------------------------------------------------------------------------------  Cardiac Enzymes  Recent Labs Lab 02/01/16 0332 02/01/16 1138  TROPONINI 1.30* 1.32*   ------------------------------------------------------------------------------------------------------------------  RADIOLOGY:  US Renal  Result Date: 02/01/2016 CLINICAL DATA:  Right nephrectomy.  Renal failure. EXAM: RENAL / URINARY TRACT ULTRASOUND COMPLETE COMPARISON:  KUB 05/19/2015.  CT 06/14/2014 . FINDINGS: Right Kidney: Right nephrectomy. Left Kidney: Length: 10.7 cm. Left lower renal pole is inhomogeneous with  indistinct corticomedullary junction. A questionable ill-defined mass noted in the lower pole the left kidney. No hydronephrosis. Bladder: Appears normal for degree of bladder distention. Left pleural effusion is noted. IMPRESSION: 1. Possible left lower renal pole mass lesion. Gadolinium-enhanced renal MRI suggested for further evaluation. 2.  Right nephrectomy. 3.  Left pleural effusion. Electronically Signed   By:  Marcello Moores  Register   On: 02/01/2016 14:52   Dg Chest Port 1 View  Result Date: 02/02/2016 CLINICAL DATA:  Heart failure.  Follow-up. EXAM: PORTABLE CHEST 1 VIEW COMPARISON:  02/01/2016 FINDINGS: Central line is unchanged. Diffuse interstitial and alveolar edema persists, similar or even slightly worsened when compared to the previous films. Some lower lobe volume loss. IMPRESSION: Persistent pulmonary edema.  The same or slightly worsened. Electronically Signed   By: Nelson Chimes M.D.   On: 02/02/2016 09:34   Dg Chest Port 1 View  Result Date: 02/01/2016 CLINICAL DATA:  Encounter for central line placement. EXAM: PORTABLE CHEST 1 VIEW COMPARISON:  02/01/2016 at 4:15 a.m. FINDINGS: There is a new right internal jugular dual-lumen central venous catheter. The distal catheter tip projects in the right atrium. No pneumothorax. No change in the bilateral interstitial airspace lung opacities consistent with pulmonary edema. IMPRESSION: 1. Right internal jugular dual-lumen central venous catheter. Distal tip projects in the right atrium. No pneumothorax. Electronically Signed   By: Lajean Manes M.D.   On: 02/01/2016 12:38   Dg Chest Port 1 View  Result Date: 02/01/2016 CLINICAL DATA:  Respiratory failure. EXAM: PORTABLE CHEST 1 VIEW COMPARISON:  01/31/2016. FINDINGS: Cardiomegaly with diffuse bilateral pulmonary infiltrates consistent with pulmonary edema and bilateral pleural effusions again noted with minimal improvement. No pneumothorax. IMPRESSION: Persistent congestive heart failure bilateral pulmonary edema and small pleural effusions. Minimal interval change. Electronically Signed   By: Marcello Moores  Register   On: 02/01/2016 07:07    ASSESSMENT AND PLAN:   Active Problems:   Acute respiratory failure ([place])   Palliative care encounter   DNR (do not resuscitate)  * Ac hypoxic respi failure   Due to CHF exacerbation.   Due to renal failure, not to give lasix.    Called Cardio and nephro consult.     Much improved after one session of HD.     Now on nasal canula oxygen.  * Ac on ch renal failure, hematuria, UTI   Have chronic urological issues   Get renal US, urology and nephrology consult   IV vanc and azactam, cx sent.    Abx changed to rocephin.   [facility]  * Sepsis   On presentation- lactic acid high, tachycardia, elevated WBcs   Due to UTI   Monitor.   Critical due to other complicating issues like renal failure and CHF.  * Acute systolic CHF  Ef 25 % ac systolic    I could not find any previous echo result.    appreciated Echo, cardio consult.    Required bipap for short time.  * Acute on ch anemia, hematuira   Transfuse one unit PRBC per ICU   * elevated troponin   With hematuria, not a candidate for anticoagulation.   Likely this may be due to stress of CHF and renal failure with infection.   Echo and cardio consult.  Prognosis very poor and condition critical.    I will like to update them about condition and get clear guideline about extent of treatment options and code status.  All the records are reviewed and case discussed with Care Management/Social Workerr. Management plans discussed with the patient, family and they are in agreement.  CODE STATUS: full  TOTAL TIME TAKING CARE OF THIS PATIENT: 40 critical care minutes.     POSSIBLE D/C IN 3-4 DAYS, DEPENDING ON CLINICAL CONDITION.   Vaughan Basta M.D on 02/02/2016   Between 7am to 6pm - Pager - [phone]  After 6pm go to [email or web address] - password [email or web address] [email or web address] [facility]  Office  [phone]  CC: Primary care physician; [name], MD  Note: This dictation was prepared with Dragon dictation along with smaller phrase technology. Any transcriptional errors that result from this process are unintentional.

## 2016-02-02 NOTE — Progress Notes (Signed)
Pt slept most of the night. Had one incident of wheezing with respiratory distress that was relieved with a svn treatment. Had a BM this AM, and tried to get out of bed several times. Bed alarm engaged for safety. Denied pain and discomfort.

## 2016-02-02 NOTE — Progress Notes (Signed)
Subjective:   2000 cc removed with HD yesterday Currently on [place], off of BiPAP UOP remains poor Patient is alert and able to follow commands   Objective:  Vital signs in last 24 hours:  Temp:  [97.4 F (36.3 C)-98.2 F (36.8 C)] 98.1 F (36.7 C) (09/20 0800) Pulse Rate:  [99-121] 109 (09/20 0800) Resp:  [13-30] 25 (09/20 0800) BP: (79-127)/(62-87) 112/79 (09/20 0800) SpO2:  [89 %-99 %] 92 % (09/20 0803) FiO2 (%):  [35 %-36 %] 36 % (09/20 0803) Weight:  [64.9 kg (143 lb 1.3 oz)-67.1 kg (147 lb 14.9 oz)] 64.9 kg (143 lb 1.3 oz) (09/20 0500)  Weight change: -5.475 kg (-12 lb 1.1 oz) Filed Weights   02/01/16 1429 02/01/16 1715 02/02/16 0500  Weight: 67.1 kg (147 lb 14.9 oz) 65 kg (143 lb 4.8 oz) 64.9 kg (143 lb 1.3 oz)    Intake/Output:    Intake/Output Summary (Last 24 hours) at 02/02/16 0954 Last data filed at 02/02/16 0300  Gross per 24 hour  Intake             1231 ml  Output             2400 ml  Net            -1169 ml     Physical Exam: General: NAD, sitting up in bed  HEENT Anicteric, [place] O2  Neck supple  Pulm/lungs Coarse crackles b/l,   CVS/Heart irregular  Abdomen:  Soft, NT  Extremities: Trace edema  Neurologic: Alert, follows commands  Skin: Warm dry  Access: Rt IJ temp cath       Basic Metabolic Panel:   Recent Labs Lab 01/31/16 1956 02/01/16 0332 02/01/16 1430 02/02/16 0330  NA 138 138 137 141  K 4.5 4.5 5.1 4.1  CL 105 107 104 103  CO2 21* 21* 21* 25  GLUCOSE 293* 157* 197* 134*  BUN 70* 71* 81* 57*  CREATININE 4.63* 4.56* 4.51* 3.32*  CALCIUM 8.0* 8.0* 8.1* 8.1*  MG  --  2.5*  --   --   PHOS  --  5.3*  --   --      CBC:  Recent Labs Lab 01/31/16 1956 02/01/16 0332 02/02/16 0330  WBC 11.0* 9.9 14.3*  NEUTROABS 9.1*  --   --   HGB 7.7* 7.4* 8.2*  HCT 24.2* 22.1* 25.1*  MCV 89.2 88.1 87.1  PLT 263 251 266      Microbiology:  Recent Results (from the past 720 hour(s))  Blood culture (routine x 2)     Status: None  (Preliminary result)   Collection Time: 01/31/16  8:00 PM  Result Value Ref Range Status   Specimen Description BLOOD  LEFT HAND  Final   Special Requests   Final    BOTTLES DRAWN AEROBIC AND ANAEROBIC  AER 3CC ANA 2CC   Culture NO GROWTH < 12 HOURS  Final   Report Status PENDING  Incomplete  Blood culture (routine x 2)     Status: None (Preliminary result)   Collection Time: 01/31/16  8:03 PM  Result Value Ref Range Status   Specimen Description BLOOD  R WRIST  Final   Special Requests BOTTLES DRAWN AEROBIC AND ANAEROBIC  AER4CC ANA3CC  Final   Culture NO GROWTH < 12 HOURS  Final   Report Status PENDING  Incomplete  Urine culture     Status: None (Preliminary result)   Collection Time: 01/31/16  9:36 PM  Result Value Ref  Range Status   Specimen Description URINE, CLEAN CATCH  Final   Special Requests NONE  Final   Culture   Final    CULTURE REINCUBATED FOR BETTER GROWTH Performed at [facility]    Report Status PENDING  Incomplete  MRSA PCR Screening     Status: Abnormal   Collection Time: 02/01/16 12:52 AM  Result Value Ref Range Status   MRSA by PCR POSITIVE (A) NEGATIVE Final    Comment:        The GeneXpert MRSA Assay (FDA approved for NASAL specimens only), is one component of a comprehensive MRSA colonization surveillance program. It is not intended to diagnose MRSA infection nor to guide or monitor treatment for MRSA infections. RESULT CALLED TO, READ BACK BY AND VERIFIED WITH: ALISA RAWLINS @ [id number] ON 02/01/2016 BY CAF     Coagulation Studies:  Recent Labs  01/31/16 1956  LABPROT 16.7*  INR 1.34    Urinalysis:  Recent Labs  01/31/16 2136  COLORURINE RED*  LABSPEC 1.017  PHURINE 6.0  GLUCOSEU NEGATIVE  HGBUR 3+*  BILIRUBINUR NEGATIVE  KETONESUR NEGATIVE  PROTEINUR 100*  NITRITE NEGATIVE  LEUKOCYTESUR 3+*      Imaging: US Renal  Result Date: 02/01/2016 CLINICAL DATA:  Right nephrectomy.  Renal failure. EXAM: RENAL / URINARY TRACT  ULTRASOUND COMPLETE COMPARISON:  KUB 05/19/2015.  CT 06/14/2014 . FINDINGS: Right Kidney: Right nephrectomy. Left Kidney: Length: 10.7 cm. Left lower renal pole is inhomogeneous with indistinct corticomedullary junction. A questionable ill-defined mass noted in the lower pole the left kidney. No hydronephrosis. Bladder: Appears normal for degree of bladder distention. Left pleural effusion is noted. IMPRESSION: 1. Possible left lower renal pole mass lesion. Gadolinium-enhanced renal MRI suggested for further evaluation. 2.  Right nephrectomy. 3.  Left pleural effusion. Electronically Signed   By: Marcello Moores  Register   On: 02/01/2016 14:52   Dg Chest Port 1 View  Result Date: 02/02/2016 CLINICAL DATA:  Heart failure.  Follow-up. EXAM: PORTABLE CHEST 1 VIEW COMPARISON:  02/01/2016 FINDINGS: Central line is unchanged. Diffuse interstitial and alveolar edema persists, similar or even slightly worsened when compared to the previous films. Some lower lobe volume loss. IMPRESSION: Persistent pulmonary edema.  The same or slightly worsened. Electronically Signed   By: Nelson Chimes M.D.   On: 02/02/2016 09:34   Dg Chest Port 1 View  Result Date: 02/01/2016 CLINICAL DATA:  Encounter for central line placement. EXAM: PORTABLE CHEST 1 VIEW COMPARISON:  02/01/2016 at 4:15 a.m. FINDINGS: There is a new right internal jugular dual-lumen central venous catheter. The distal catheter tip projects in the right atrium. No pneumothorax. No change in the bilateral interstitial airspace lung opacities consistent with pulmonary edema. IMPRESSION: 1. Right internal jugular dual-lumen central venous catheter. Distal tip projects in the right atrium. No pneumothorax. Electronically Signed   By: Lajean Manes M.D.   On: 02/01/2016 12:38   Dg Chest Port 1 View  Result Date: 02/01/2016 CLINICAL DATA:  Respiratory failure. EXAM: PORTABLE CHEST 1 VIEW COMPARISON:  01/31/2016. FINDINGS: Cardiomegaly with diffuse bilateral pulmonary  infiltrates consistent with pulmonary edema and bilateral pleural effusions again noted with minimal improvement. No pneumothorax. IMPRESSION: Persistent congestive heart failure bilateral pulmonary edema and small pleural effusions. Minimal interval change. Electronically Signed   By: Marcello Moores  Register   On: 02/01/2016 07:07   Dg Chest Port 1 View  Result Date: 01/31/2016 CLINICAL DATA:  Shortness of breath and decreased oxygen saturation on oxygen. Current treatment for urinary tract infection.  Smoker. EXAM: PORTABLE CHEST 1 VIEW COMPARISON:  04/25/2015 FINDINGS: Cardiac enlargement with diffuse pulmonary vascular congestion. Bilateral perihilar airspace and diffuse interstitial disease consistent with edema. Bilateral pneumonia also possible but felt less likely due vascular congestion and cardiac enlargement. Small bilateral pleural effusions. No pneumothorax. IMPRESSION: Cardiac enlargement with pulmonary vascular congestion, diffuse bilateral pulmonary edema, and small pleural effusions. Electronically Signed   By: Lucienne Capers M.D.   On: 01/31/2016 21:16     Medications:     . sodium chloride  10 mL/hr Intravenous Once  . cefTRIAXone (ROCEPHIN)  IV  1 g Intravenous Q24H  . chlorhexidine  15 mL Mouth Rinse BID  . famotidine (PEPCID) IV  20 mg Intravenous Q24H  . feeding supplement (JEVITY 1.5 CAL/FIBER)  237 mL Per Tube QID  . insulin aspart  2-6 Units Subcutaneous QID  . ipratropium-albuterol  3 mL Nebulization Q4H  . mouth rinse  15 mL Mouth Rinse q12n4p  . predniSONE  30 mg Oral Q breakfast  . sertraline  50 mg Oral Daily   sodium chloride, ipratropium-albuterol  Assessment/ Plan:  75 y.o. 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 Elyse Hsu on 02/07/2016.   1. ARF,  oliguric 2. CKD st 3, Baseline Cr 2.0 From 11/23/15. GFR 33.  3. Solitary left Kidney 4. Low grade Ta bladder cancer with left renal pelvic mass s/p multiple ablations 5. Acute pulmonary edema 6. Anemia, requiring blood transfusion 7. NSTEMI/ ACS  ARF is likely multifactorial, ATN. Renal U.S is neg for obstruction. Bactrim use from last week could have contributed to ARF  At present, Patient is oligoanuric. He received one treatment of dialysis yesterday His respiratory status is tenuous, but improved. Off of BiPAP Electrolytes and Volume status are acceptable No acute indication for Dialysis at present   Trial of iv lasix  Case discussed with Dr Anselm Jungling and Dr Stevenson Clinch.     LOS: 2 Murlean Iba 9/20/20179:54 AM

## 2016-02-03 LAB — BASIC METABOLIC PANEL
Anion gap: 9 (ref 5–15)
BUN: 76 mg/dL — AB (ref 6–20)
CHLORIDE: 103 mmol/L (ref 101–111)
CO2: 29 mmol/L (ref 22–32)
CREATININE: 4.08 mg/dL — AB (ref 0.61–1.24)
Calcium: 8.4 mg/dL — ABNORMAL LOW (ref 8.9–10.3)
GFR calc Af Amer: 15 mL/min — ABNORMAL LOW (ref >60)
GFR calc non Af Amer: 13 mL/min — ABNORMAL LOW (ref >60)
Glucose, Bld: 128 mg/dL — ABNORMAL HIGH (ref 65–99)
Potassium: 4.6 mmol/L (ref 3.5–5.1)
Sodium: 141 mmol/L (ref 135–145)

## 2016-02-03 LAB — CBC
HCT: 25.8 % — ABNORMAL LOW (ref 40.0–52.0)
Hemoglobin: 8.6 g/dL — ABNORMAL LOW (ref 13.0–18.0)
MCH: 29.4 pg (ref 26.0–34.0)
MCHC: 33.2 g/dL (ref 32.0–36.0)
MCV: 88.5 fL (ref 80.0–100.0)
PLATELETS: 265 10*3/uL (ref 150–440)
RBC: 2.91 MIL/uL — ABNORMAL LOW (ref 4.40–5.90)
RDW: 14.6 % — AB (ref 11.5–14.5)
WBC: 13.7 10*3/uL — ABNORMAL HIGH (ref 3.8–10.6)

## 2016-02-03 LAB — OCCULT BLOOD X 1 CARD TO LAB, STOOL: Fecal Occult Bld: NEGATIVE

## 2016-02-03 MED ORDER — JEVITY 1.5 CAL/FIBER PO LIQD
237.0000 mL | Freq: Four times a day (QID) | ORAL | Status: DC
  Administered 2016-02-03: 237 mL
  Administered 2016-02-03: 474 mL
  Administered 2016-02-04 – 2016-02-05 (×5): 237 mL
  Administered 2016-02-05: 474 mL
  Administered 2016-02-05 – 2016-02-06 (×3): 237 mL
  Administered 2016-02-06: 474 mL
  Administered 2016-02-06: 237 mL

## 2016-02-03 NOTE — Consult Note (Signed)
[facility] Consultation Note  Patient ID: Matthew Poole, MRN: [id number], DOB/AGE: July 13, 1940 75 y.o. Admit date: 01/31/2016   Date of Consult: 02/03/2016 Primary Physician: [name], MD Primary Cardiologist: None  Chief Complaint:  Chief Complaint  Patient presents with  . Shortness of Breath   Reason for Consult: acute on chronic systolic dysfunction heart failure with myocardial infarction  HPI: 75 y.o. male with known chronic systolic dysfunction heart failure with ejection fraction of 25% and global hypokinesis who is had severe left ventricular hypertrophy as well. The patient has been previously on appropriate medication management that he can tolerate but has had some other significant issues including recent cystoscopy and other intervention. At that time the patient had significant hematuria and bleeding for which she had significant new anemia with a hemoglobin of 7.7 now improved 8.2. Additionally he had severe chronic kidney disease stage V with a regular filtration rate of 18. These 3 issues significantly caused patient to have acute on chronic systolic dysfunction heart failure with hypoxia requiring BiPAP machine and treatment of hypoxia with further treatment of pulmonary edema. The patient slowly improved but did have an elevated troponin of 1.4 consistent with non-ST elevation myocardial infarction due to above rather than acute coronary syndrome. Since then the patient is slowly slowly improving with appropriate oxygen therapy and other supportive therapy with no evidence of new onset of shortness of breath and/or anginal type symptoms. Currently the patient is starting to ambulate without evidence of further significant symptoms  Past Medical History:  Diagnosis Date  . Anemia   . Cancer (HCC)    renal cell carcinoma  . COPD (chronic obstructive pulmonary disease) ([place])   . Depression   . Difficulty walking   . Dysphagia, oropharyngeal   . G  tube feedings ([place])   . GERD (gastroesophageal reflux disease)   . Mental retardation   . Muscle weakness   . Osteoporosis   . Psoriasis   . Psoriasis   . Reflux   . Renal cell carcinoma ([place])   . Renal disorder    acute on chronic renal disorder  . Sepsis secondary to UTI [facility])       Surgical History:  Past Surgical History:  Procedure Laterality Date  . PEG PLACEMENT       Home Meds: Prior to Admission medications   Medication Sig Start Date End Date Taking? Authorizing Provider  acetaminophen (TYLENOL) 325 MG tablet Take 325 mg by mouth every 4 (four) hours as needed for mild pain, fever or headache.    Yes Historical Provider, MD  albuterol (PROVENTIL) (2.5 MG/3ML) 0.083% nebulizer solution Take 3 mLs (2.5 mg total) by nebulization every 6 (six) hours as needed for wheezing. 04/07/15  Yes Richard Leslye Peer, MD  chlorhexidine (PERIDEX) 0.12 % solution Use as directed 1 mL in the mouth or throat 2 (two) times daily.    Yes Historical Provider, MD  cholecalciferol (VITAMIN D) 1000 UNITS tablet Take 1,000 Units by mouth daily.   Yes Historical Provider, MD  ciprofloxacin (CIPRO) 250 MG tablet Take 250 mg by mouth 2 (two) times daily. For 10 days 01/24/16  Yes Historical Provider, MD  docusate sodium (COLACE) 100 MG capsule Take 100 mg by mouth 2 (two) times daily.   Yes Historical Provider, MD  doxazosin (CARDURA) 2 MG tablet Take 2 mg by mouth daily.   Yes Historical Provider, MD  ferrous sulfate 325 (65 FE) MG tablet Take 325 mg by mouth daily with breakfast.  Yes Historical Provider, MD  loperamide (IMODIUM) 2 MG capsule Take 2 mg by mouth 4 (four) times daily as needed for diarrhea or loose stools.   Yes Historical Provider, MD  Multiple Vitamins-Minerals (MULTIVITAMIN WITH IRON-MINERALS) liquid Take 5 mLs by mouth daily.    Yes Historical Provider, MD  Nutritional Supplements (FEEDING SUPPLEMENT, JEVITY 1.5 CAL,) LIQD Place 237 mLs into feeding tube 4 (four) times daily.   Yes  Historical Provider, MD  omeprazole (PRILOSEC) 20 MG capsule Take 20 mg by mouth 2 (two) times daily.    Yes Historical Provider, MD  oxybutynin (DITROPAN) 5 MG tablet Take 5 mg by mouth 3 (three) times daily as needed for bladder spasms.   Yes Historical Provider, MD  Probiotic Product (4X PROBIOTIC PO) Take 1 capsule by mouth daily.    Yes Historical Provider, MD  senna (SENOKOT) 8.6 MG TABS tablet Place 2 tablets into feeding tube daily.   Yes Historical Provider, MD  sertraline (ZOLOFT) 50 MG tablet Take 50 mg by mouth daily.   Yes Historical Provider, MD  sodium bicarbonate 325 MG tablet Take 975 mg by mouth 3 (three) times daily.   Yes Historical Provider, MD  sulfamethoxazole-trimethoprim (BACTRIM,SEPTRA) 400-80 MG tablet Take 1 tablet by mouth 2 (two) times daily. For 10 days 01/26/16  Yes Historical Provider, MD  triamcinolone cream (KENALOG) 0.1 % Apply 1 application topically 2 (two) times daily.   Yes Historical Provider, MD  vitamin C (ASCORBIC ACID) 250 MG tablet Take 500 mg by mouth 2 (two) times daily.    Yes Historical Provider, MD    Inpatient Medications:  . cefTRIAXone (ROCEPHIN)  IV  1 g Intravenous Q24H  . chlorhexidine  15 mL Mouth Rinse BID  . famotidine (PEPCID) IV  20 mg Intravenous Q24H  . feeding supplement (JEVITY 1.5 CAL/FIBER)  237 mL Per Tube QID  . ipratropium-albuterol  3 mL Nebulization Q6H  . mouth rinse  15 mL Mouth Rinse q12n4p  . predniSONE  30 mg Oral Q breakfast  . sertraline  50 mg Oral Daily      Allergies:  Allergies  Allergen Reactions  . Penicillins Itching, Nausea And Vomiting, Rash and Other (See Comments)    Unable to obtain enough information to answer additional questions about this medication.      Social History   Social History  . Marital status: Unknown    Spouse name: N/A  . Number of children: N/A  . Years of education: N/A   Occupational History  . Not on file.   Social History Main Topics  . Smoking status: Current  Every Day Smoker    Packs/day: 0.50    Types: Cigarettes  . Smokeless tobacco: Never Used  . Alcohol use No  . Drug use: No  . Sexual activity: Not on file   Other Topics Concern  . Not on file   Social History Narrative  . No narrative on file     Family History  Problem Relation Age of Onset  . Family history unknown: Yes     Review of Systems Positive for Shortness of breath hematuria Negative for: General:  chills, fever, night sweats or weight changes.  Cardiovascular: Positive for PND orthopnea negative for syncope dizziness  Dermatological skin lesions rashes Respiratory: Positive for Cough congestion Urologic: Frequent urination urination at night and positive for hematuria Abdominal: negative for nausea, vomiting, diarrhea, bright red blood per rectum, melena, or hematemesis Neurologic: negative for visual changes, and/or hearing changes  All other systems reviewed and are otherwise negative except as noted above.  Labs:  Recent Labs  01/31/16 1956 01/31/16 2341 23-Feb-2016 0332 02/23/16 1138  TROPONINI 1.46* 1.32* 1.30* 1.32*   Lab Results  Component Value Date   WBC 13.7 (H) 02/03/2016   HGB 8.6 (L) 02/03/2016   HCT 25.8 (L) 02/03/2016   MCV 88.5 02/03/2016   PLT 265 02/03/2016    Recent Labs Lab 01/31/16 1956  02/03/16 0446  NA 138  < > 141  K 4.5  < > 4.6  CL 105  < > 103  CO2 21*  < > 29  BUN 70*  < > 76*  CREATININE 4.63*  < > 4.08*  CALCIUM 8.0*  < > 8.4*  PROT 6.4*  --   --   BILITOT 0.5  --   --   ALKPHOS 99  --   --   ALT 11*  --   --   AST 24  --   --   GLUCOSE 293*  < > 128*  < > = values in this interval not displayed. No results found for: CHOL, HDL, LDLCALC, TRIG No results found for: DDIMER  Radiology/Studies:  US Renal  Result Date: 2016/02/23 CLINICAL DATA:  Right nephrectomy.  Renal failure. EXAM: RENAL / URINARY TRACT ULTRASOUND COMPLETE COMPARISON:  KUB 05/19/2015.  CT 06/14/2014 . FINDINGS: Right Kidney: Right  nephrectomy. Left Kidney: Length: 10.7 cm. Left lower renal pole is inhomogeneous with indistinct corticomedullary junction. A questionable ill-defined mass noted in the lower pole the left kidney. No hydronephrosis. Bladder: Appears normal for degree of bladder distention. Left pleural effusion is noted. IMPRESSION: 1. Possible left lower renal pole mass lesion. Gadolinium-enhanced renal MRI suggested for further evaluation. 2.  Right nephrectomy. 3.  Left pleural effusion. Electronically Signed   By: Marcello Moores  Register   On: 02/23/2016 14:52   Dg Chest Port 1 View  Result Date: 02/02/2016 CLINICAL DATA:  Heart failure.  Follow-up. EXAM: PORTABLE CHEST 1 VIEW COMPARISON:  23-Feb-2016 FINDINGS: Central line is unchanged. Diffuse interstitial and alveolar edema persists, similar or even slightly worsened when compared to the previous films. Some lower lobe volume loss. IMPRESSION: Persistent pulmonary edema.  The same or slightly worsened. Electronically Signed   By: Nelson Chimes M.D.   On: 02/02/2016 09:34   Dg Chest Port 1 View  Result Date: 02/23/16 CLINICAL DATA:  Encounter for central line placement. EXAM: PORTABLE CHEST 1 VIEW COMPARISON:  02/23/16 at 4:15 a.m. FINDINGS: There is a new right internal jugular dual-lumen central venous catheter. The distal catheter tip projects in the right atrium. No pneumothorax. No change in the bilateral interstitial airspace lung opacities consistent with pulmonary edema. IMPRESSION: 1. Right internal jugular dual-lumen central venous catheter. Distal tip projects in the right atrium. No pneumothorax. Electronically Signed   By: Lajean Manes M.D.   On: February 23, 2016 12:38   Dg Chest Port 1 View  Result Date: 02/23/16 CLINICAL DATA:  Respiratory failure. EXAM: PORTABLE CHEST 1 VIEW COMPARISON:  01/31/2016. FINDINGS: Cardiomegaly with diffuse bilateral pulmonary infiltrates consistent with pulmonary edema and bilateral pleural effusions again noted with minimal  improvement. No pneumothorax. IMPRESSION: Persistent congestive heart failure bilateral pulmonary edema and small pleural effusions. Minimal interval change. Electronically Signed   By: Marcello Moores  Register   On: 02/23/16 07:07   Dg Chest Port 1 View  Result Date: 01/31/2016 CLINICAL DATA:  Shortness of breath and decreased oxygen saturation on oxygen. Current treatment for urinary tract infection.  Smoker. EXAM: PORTABLE CHEST 1 VIEW COMPARISON:  04/25/2015 FINDINGS: Cardiac enlargement with diffuse pulmonary vascular congestion. Bilateral perihilar airspace and diffuse interstitial disease consistent with edema. Bilateral pneumonia also possible but felt less likely due vascular congestion and cardiac enlargement. Small bilateral pleural effusions. No pneumothorax. IMPRESSION: Cardiac enlargement with pulmonary vascular congestion, diffuse bilateral pulmonary edema, and small pleural effusions. Electronically Signed   By: Lucienne Capers M.D.   On: 01/31/2016 21:16    EKG: Normal sinus rhythm with left ventricular hypertrophy  Weights: Filed Weights   02/01/16 1715 02/02/16 0500 02/03/16 0500  Weight: 143 lb 4.8 oz (65 kg) 143 lb 1.3 oz (64.9 kg) 135 lb 11.2 oz (61.6 kg)     Physical Exam: Blood pressure 104/65, pulse 81, temperature 98.4 F (36.9 C), temperature source Oral, resp. rate 18, height 6' (1.829 m), weight 135 lb 11.2 oz (61.6 kg), SpO2 97 %. Body mass index is 18.4 kg/m. General: Well developed, well nourished, in no acute distress. Head eyes ears nose throat: Normocephalic, atraumatic, sclera non-icteric, no xanthomas, nares are without discharge. No apparent thyromegaly and/or mass  Lungs: Normal respiratory effort.  no wheezes,Basilar rales diffuse rhonchi.  Heart: RRR with normal S1 S2. Apical murmur gallop, no rub, PMI is normal size and placement, carotid upstroke normal without bruit, jugular venous pressure is normal Abdomen: Soft, non-tender, non-distended with  normoactive bowel sounds. No hepatomegaly. No rebound/guarding. No obvious abdominal masses. Abdominal aorta is normal size without bruit Extremities: Trace to 1+ edema. no cyanosis, no clubbing, no ulcers  Peripheral : 2+ bilateral upper extremity pulses, 2+ bilateral femoral pulses, 2+ bilateral dorsal pedal pulse Neuro: Alert and not oriented. No facial asymmetry. No focal deficit. Moves all extremities spontaneously. Musculoskeletal: Normal muscle tone without kyphosis Psych:  Responds to questions appropriately with a normal affect.    Assessment: 75 year old male with acute on chronic systolic dysfunction congestive heart failure likely exacerbated due to anemia hematuria and chronic kidney disease now slightly improved with the non-ST elevation myocardial infarction due to above rather than acute coronary syndrome  Plan: 1. Continue supportive care with oxygenation and BiPAP only as needed for pulmonary edema 2. Further following closely for worsening chronic kidney disease and medication management including Lasix as necessary for pulmonary edema 3. Ment of anemia which she appears to be chronic and exacerbating above 4. No further cardiac diagnostics necessary at this time 5. Patient will need cardiac rehabilitation as well as physical rehabilitation following for worsening symptoms and further intervention necessary  Signed, Corey Skains M.D. [facility] 02/03/2016, 1:10 PM

## 2016-02-03 NOTE — Progress Notes (Addendum)
Daily Progress Note   Patient Name: Matthew Poole       Date: 02/03/2016 DOB: 05-04-1941  Age: 75 y.o. MRN#: [id number] Attending Physician: Hillary Bow, MD Primary Care Physician: [name], MD Admit Date: 01/31/2016  Reason for Consultation/Follow-up: Establishing goals of care  Subjective: [place] is sitting in chair. Denies any distress of discomfort. Asking for water. Able to follow commands. No agitation or impulsiveness observed on visit. Noted improvement in urine output although continues to have gross hematuria. BUN/Creatinine increased today. Nephrology monitoring.   Attempted to update legal guardian [name] but no answer. Previously confirmed DNR and no dialysis or aggressive measure. Focus is on supportive care and treating the treatable.   Discussed with Dr. Darvin Neighbours.   Length of Stay: 3  Current Medications: Scheduled Meds:  . cefTRIAXone (ROCEPHIN)  IV  1 g Intravenous Q24H  . chlorhexidine  15 mL Mouth Rinse BID  . famotidine (PEPCID) IV  20 mg Intravenous Q24H  . feeding supplement (JEVITY 1.5 CAL/FIBER)  237 mL Per Tube QID  . ipratropium-albuterol  3 mL Nebulization Q6H  . mouth rinse  15 mL Mouth Rinse q12n4p  . predniSONE  30 mg Oral Q breakfast  . sertraline  50 mg Oral Daily    Continuous Infusions:    PRN Meds: sodium chloride, ipratropium-albuterol  Physical Exam  Constitutional: He appears well-developed.  Thin, frail  Cardiovascular: Tachycardia present.   Pulmonary/Chest: Effort normal. No accessory muscle usage. No tachypnea. No respiratory distress.  Abdominal: Soft.  + PEG  Neurological: He is alert. He is disoriented.  Answers simple yes/no questions            Vital Signs: BP 104/65 (BP Location: Right Arm)   Pulse 81    Temp 98.4 F (36.9 C) (Oral)   Resp 18   Ht 6' (1.829 m)   Wt 61.6 kg (135 lb 11.2 oz)   SpO2 97%   BMI 18.40 kg/m  SpO2: SpO2: 97 % O2 Device: O2 Device: Nasal Cannula O2 Flow Rate: O2 Flow Rate (L/min): 2 L/min  Intake/output summary:  Intake/Output Summary (Last 24 hours) at 02/03/16 1430 Last data filed at 02/03/16 0547  Gross per 24 hour  Intake              549 ml  Output  775 ml  Net             -226 ml   LBM: Last BM Date: 02/03/16 Baseline Weight: Weight: 72.6 kg (160 lb) Most recent weight: Weight: 61.6 kg (135 lb 11.2 oz)       Palliative Assessment/Data: PPS: 40%   Flowsheet Rows   Flowsheet Row Most Recent Value  Intake Tab  Referral Department  Hospitalist  Unit at Time of Referral  ICU  Palliative Care Primary Diagnosis  Cardiac  Date Notified  02/02/16  Palliative Care Type  New Palliative care  Reason for referral  Clarify Goals of Care  Date of Admission  01/31/16  Date first seen by Palliative Care  02/02/16  # of days Palliative referral response time  0 Day(s)  # of days IP prior to Palliative referral  2  Clinical Assessment  Psychosocial & Spiritual Assessment  Palliative Care Outcomes      Patient Active Problem List   Diagnosis Date Noted  . Palliative care encounter   . DNR (do not resuscitate)   . Acute respiratory failure ([place]) 01/31/2016  . Malnutrition of moderate degree 04/06/2015  . UTI (lower urinary tract infection) 04/05/2015  . Acute renal failure ([place]) 04/05/2015  . Sepsis secondary to UTI ([place]) 12/25/2014  . Sepsis ([place]) 12/25/2014    Palliative Care Assessment & Plan   Patient Profile: 76 y.o. male  with past medical history of renal cell carcinoma s/pmultiple ablations and right nephrectomy, chronic indwelling catheter, psoriasis, COPD, CHF EF 25-50%., CKD stage III, GERD, mental retardation and dysphagia, s/p PEG admitted on 01/31/2016 with shortness of breath and acute renal failure requiring BiPAP at  admission and one hemodialysis treatment 9/19 and received 1 unit PRBC 9/19 (chronic hematuria).   Assessment: Sitting up in recliner. No distress or discomfort noted.   Recommendations/Plan:  Hematuria: Per urology - followed by [facility]rology Dr. Elyse Hsu    Code Status:    Code Status Orders        Start     Ordered   02/01/16 0927  Do not attempt resuscitation (DNR)  Continuous    Question Answer Comment  In the event of cardiac or respiratory ARREST Do not call a "code blue"   In the event of cardiac or respiratory ARREST Do not perform Intubation, CPR, defibrillation or ACLS   In the event of cardiac or respiratory ARREST Use medication by any route, position, wound care, and other measures to relive pain and suffering. May use oxygen, suction and manual treatment of airway obstruction as needed for comfort.      02/01/16 0926    Code Status History    Date Active Date Inactive Code Status Order ID Comments User Context   01/31/2016 10:31 PM 02/01/2016  8:56 AM Full Code [id number]  Holley Raring, NP ED   04/05/2015  5:07 PM 04/07/2015  6:17 PM Full Code [id number]  [name], MD Inpatient   12/25/2014  2:54 PM 12/29/2014  5:42 PM Full Code [id number]  Vaughan Basta, MD Inpatient       Prognosis:  Unable to determine - with worsening comorbitities prognosis poor. Risk of acute decline.   Discharge Planning:  Return to [place] with home health. Could likely benefit from outpatient palliative care.     Thank you for allowing the Palliative Medicine Team to assist in the care of this patient.   Time In: 0900 Time Out: 0915 Total Time 12min Prolonged Time Billed  no       Greater than 50%  of this time was spent counseling and coordinating care related to the above assessment and plan.  Pershing Proud, NP  Please contact Palliative Medicine Team phone at [phone] for questions and concerns.

## 2016-02-03 NOTE — Progress Notes (Signed)
[facility] [place] NAME: Matthew Poole    MR#:  [id number]  DATE OF BIRTH:  1941-02-23  SUBJECTIVE:  CHIEF COMPLAINT:   Chief Complaint  Patient presents with  . Shortness of Breath   Admitted with shortness of breath, CHF and acute kidney injury. Was on a BiPAP and now transitioned to nasal cannula. Transferred out of ICU.  No concerns. Patient has cognitive impairment limiting history.  REVIEW OF SYSTEMS:  Unable to give ROS due to baseline mental status. ROS  DRUG ALLERGIES:   Allergies  Allergen Reactions  . Penicillins Itching, Nausea And Vomiting, Rash and Other (See Comments)    Unable to obtain enough information to answer additional questions about this medication.      VITALS:  Blood pressure 104/65, pulse 81, temperature 98.4 F (36.9 C), temperature source Oral, resp. rate 18, height 6' (1.829 m), weight 61.6 kg (135 lb 11.2 oz), SpO2 97 %.  PHYSICAL EXAMINATION:  GENERAL:  75 y.o.-year-old patient lying in the bed with no acute distress.  EYES: Pupils equal, round, reactive to light and accommodation. No scleral icterus. Extraocular muscles intact.  HEENT: Head atraumatic, normocephalic. Oropharynx and nasopharynx clear.  NECK:  Supple, no jugular venous distention. No thyroid enlargement, no tenderness.  LUNGS: Normal breath sounds bilaterally, some wheezing, b/l crepitation. No use of accessory muscles of respiration.  CARDIOVASCULAR: S1, S2 normal. No murmurs, rubs, or gallops.  ABDOMEN: Soft, nontender, nondistended. Bowel sounds present. No organomegaly or mass.  Foley in place with hematuria EXTREMITIES: No pedal edema, cyanosis, or clubbing.  NEUROLOGIC: Cranial nerves II through XII are intact. Muscle strength 4/5 in all extremities. Sensation intact. Gait not checked.  PSYCHIATRIC: The patient is alert and awake. He answers simple questions. SKIN: No obvious rash, lesion, or ulcer.   Physical  Exam LABORATORY PANEL:   CBC  Recent Labs Lab 02/03/16 0446  WBC 13.7*  HGB 8.6*  HCT 25.8*  PLT 265   ------------------------------------------------------------------------------------------------------------------  Chemistries   Recent Labs Lab 01/31/16 1956 02/01/16 0332  02/03/16 0446  NA 138 138  < > 141  K 4.5 4.5  < > 4.6  CL 105 107  < > 103  CO2 21* 21*  < > 29  GLUCOSE 293* 157*  < > 128*  BUN 70* 71*  < > 76*  CREATININE 4.63* 4.56*  < > 4.08*  CALCIUM 8.0* 8.0*  < > 8.4*  MG  --  2.5*  --   --   AST 24  --   --   --   ALT 11*  --   --   --   ALKPHOS 99  --   --   --   BILITOT 0.5  --   --   --   < > = values in this interval not displayed. ------------------------------------------------------------------------------------------------------------------  Cardiac Enzymes  Recent Labs Lab 02/01/16 0332 02/01/16 1138  TROPONINI 1.30* 1.32*   ------------------------------------------------------------------------------------------------------------------  RADIOLOGY:  US Renal  Result Date: 02/01/2016 CLINICAL DATA:  Right nephrectomy.  Renal failure. EXAM: RENAL / URINARY TRACT ULTRASOUND COMPLETE COMPARISON:  KUB 05/19/2015.  CT 06/14/2014 . FINDINGS: Right Kidney: Right nephrectomy. Left Kidney: Length: 10.7 cm. Left lower renal pole is inhomogeneous with indistinct corticomedullary junction. A questionable ill-defined mass noted in the lower pole the left kidney. No hydronephrosis. Bladder: Appears normal for degree of bladder distention. Left pleural effusion is noted. IMPRESSION: 1. Possible left lower renal pole mass lesion. Gadolinium-enhanced  renal MRI suggested for further evaluation. 2.  Right nephrectomy. 3.  Left pleural effusion. Electronically Signed   By: Marcello Moores  Register   On: 02/01/2016 14:52   Dg Chest Port 1 View  Result Date: 02/02/2016 CLINICAL DATA:  Heart failure.  Follow-up. EXAM: PORTABLE CHEST 1 VIEW COMPARISON:  02/01/2016  FINDINGS: Central line is unchanged. Diffuse interstitial and alveolar edema persists, similar or even slightly worsened when compared to the previous films. Some lower lobe volume loss. IMPRESSION: Persistent pulmonary edema.  The same or slightly worsened. Electronically Signed   By: Nelson Chimes M.D.   On: 02/02/2016 09:34    ASSESSMENT AND PLAN:   Active Problems:   Acute respiratory failure ([place])   Palliative care encounter   DNR (do not resuscitate)  * Acute hypoxic respiratory failure   Due to CHF exacerbation.    Much improved after one session of HD.     Now on nasal canula oxygen.  * Ac on ch renal failure, hematuria, UTI   Have chronic urological issues   Get renal US, urology and nephrology consult   Ceftriaxone   [facility]  * Sepsis- resolved   On presentation- lactic acid high, tachycardia, elevated WBcs   Due to UTI  * Acute systolic CHF  Ef 25 % ac systolic No Lasix at this time due to acute kidney injury  * Acute on chronic anemia with hematuira   Transfused one unit in ICU   * Elevated troponin   With significant hematuria, not a candidate for anticoagulation.   Likely this may be due to stress of CHF and renal failure with infection.   Consulted cardiology. Discussed with Dr. Ubaldo Glassing.  Poor long-term prognosis     All the records are reviewed and case discussed with Care Management/Social Workerr. Management plans discussed with the patient, family and they are in agreement.  CODE STATUS: DNR  TOTAL TIME TAKING CARE OF THIS PATIENT: 40 critical care minutes.   POSSIBLE D/C IN 3-4 DAYS, DEPENDING ON CLINICAL CONDITION.  Hillary Bow R M.D on 02/03/2016   Between 7am to 6pm - Pager - [phone]  After 6pm go to [email or web address] - password [email or web address] [facility]  Office  [phone]  CC: Primary care physician; [name], MD  Note: This dictation was  prepared with Dragon dictation along with smaller phrase technology. Any transcriptional errors that result from this process are unintentional.

## 2016-02-03 NOTE — Progress Notes (Signed)
Nutrition Follow-up  DOCUMENTATION CODES:   Not applicable  INTERVENTION:  -Increase Jevity 1.5 to 5 cans daily, provides 1775, 76gm protein, 900cc free water -SLP eval -With diet advancement decrease Jevity 1.5 to 4 cans  NUTRITION DIAGNOSIS:   Increased nutrient needs related to acute illness, catabolic illness as evidenced by estimated needs. -ongoing  GOAL:   Patient will meet greater than or equal to 90% of their needs -progressing  MONITOR:   Diet advancement, TF tolerance, Labs, Weight trends  REASON FOR ASSESSMENT:   Rounds    ASSESSMENT:   75 yo male admitted with SOB with acute respiratory failure due to CHF exacerbation and acute on CRF requiring intiaition of temporary dialysis, acute on chronic anemia due to hematuria requiring PRBC. 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 Pt with hx of dysphagia and has PEG tube.   Patient continues to tolerate tubefeedings, but not meeting needs at this time, increased tubefeeding to 5 cans daily which meets 100% of needs Discussed pt with Dr. Darvin Neighbours and Anda Kraft - SLP, pt will undergo MBS to determine appropriate diet prior to advancement. Labs and medications reviewed: Prednisone  Diet Order:  Diet NPO time specified  Skin:  Reviewed, no issues  Last BM:  9/19  Height:   Ht Readings from Last 1 Encounters:  02/01/16 6' (1.829 m)    Weight:   Wt Readings from Last 1 Encounters:  02/03/16 135 lb 11.2 oz (61.6 kg)    Ideal Body Weight:  80.9 kg  BMI:  Body mass index is 18.4 kg/m.  Estimated Nutritional Needs:   Kcal:  1500-1800 calories  Protein:  60-75 gm  Fluid:  >/= 1.5L  EDUCATION NEEDS:   No education needs identified at this time  Satira Anis. Mendy Lapinsky, MS, RD LDN Inpatient Clinical  Dietitian Pager [phone]

## 2016-02-03 NOTE — Progress Notes (Addendum)
Subjective:   Denies acute c/o sitting up in chair UOP has improved No SOB  Objective:  Vital signs in last 24 hours:  Temp:  [97.8 F (36.6 C)-98.7 F (37.1 C)] 98 F (36.7 C) (09/21 0646) Pulse Rate:  [97-111] 111 (09/21 0646) Resp:  [15-25] 18 (09/21 0646) BP: (102-119)/(55-89) 118/89 (09/21 0646) SpO2:  [94 %-100 %] 96 % (09/21 0720) FiO2 (%):  [36 %] 36 % (09/20 1325) Weight:  [61.6 kg (135 lb 11.2 oz)] 61.6 kg (135 lb 11.2 oz) (09/21 0500)  Weight change: -5.547 kg (-12 lb 3.7 oz) Filed Weights   02/01/16 1715 02/02/16 0500 02/03/16 0500  Weight: 65 kg (143 lb 4.8 oz) 64.9 kg (143 lb 1.3 oz) 61.6 kg (135 lb 11.2 oz)    Intake/Output:    Intake/Output Summary (Last 24 hours) at 02/03/16 1101 Last data filed at 02/03/16 0547  Gross per 24 hour  Intake              549 ml  Output              775 ml  Net             -226 ml     Physical Exam: General: NAD, sitting up    HEENT Anicteric, [place] O2  Neck supple  Pulm/lungs Coarse crackles b/l,   CVS/Heart irregular  Abdomen:  Soft, NT, J tube in place  Extremities: Trace edema  Neurologic: Alert, follows commands  Skin: Warm dry  Access: Rt IJ temp cath       Basic Metabolic Panel:   Recent Labs Lab 01/31/16 1956 02/01/16 0332 02/01/16 1430 02/02/16 0330 02/03/16 0446  NA 138 138 137 141 141  K 4.5 4.5 5.1 4.1 4.6  CL 105 107 104 103 103  CO2 21* 21* 21* 25 29  GLUCOSE 293* 157* 197* 134* 128*  BUN 70* 71* 81* 57* 76*  CREATININE 4.63* 4.56* 4.51* 3.32* 4.08*  CALCIUM 8.0* 8.0* 8.1* 8.1* 8.4*  MG  --  2.5*  --   --   --   PHOS  --  5.3*  --   --   --      CBC:  Recent Labs Lab 01/31/16 1956 02/01/16 0332 02/02/16 0330 02/03/16 0446  WBC 11.0* 9.9 14.3* 13.7*  NEUTROABS 9.1*  --   --   --   HGB 7.7* 7.4* 8.2* 8.6*  HCT 24.2* 22.1* 25.1* 25.8*  MCV 89.2 88.1 87.1 88.5  PLT 263 251 266 265      Microbiology:  Recent Results (from the past 720 hour(s))  Blood culture (routine  x 2)     Status: None (Preliminary result)   Collection Time: 01/31/16  8:00 PM  Result Value Ref Range Status   Specimen Description BLOOD  LEFT HAND  Final   Special Requests   Final    BOTTLES DRAWN AEROBIC AND ANAEROBIC  AER 3CC ANA 2CC   Culture NO GROWTH 3 DAYS  Final   Report Status PENDING  Incomplete  Blood culture (routine x 2)     Status: None (Preliminary result)   Collection Time: 01/31/16  8:03 PM  Result Value Ref Range Status   Specimen Description BLOOD  R WRIST  Final   Special Requests BOTTLES DRAWN AEROBIC AND ANAEROBIC  AER4CC ANA3CC  Final   Culture NO GROWTH 3 DAYS  Final   Report Status PENDING  Incomplete  Urine culture     Status: Abnormal (Preliminary  result)   Collection Time: 01/31/16  9:36 PM  Result Value Ref Range Status   Specimen Description URINE, CLEAN CATCH  Final   Special Requests NONE  Final   Culture (A)  Final    >=100,000 COLONIES/mL STAPHYLOCOCCUS AUREUS 50,000 COLONIES/mL GRAM NEGATIVE RODS    Report Status PENDING  Incomplete  MRSA PCR Screening     Status: Abnormal   Collection Time: 02/01/16 12:52 AM  Result Value Ref Range Status   MRSA by PCR POSITIVE (A) NEGATIVE Final    Comment:        The GeneXpert MRSA Assay (FDA approved for NASAL specimens only), is one component of a comprehensive MRSA colonization surveillance program. It is not intended to diagnose MRSA infection nor to guide or monitor treatment for MRSA infections. RESULT CALLED TO, READ BACK BY AND VERIFIED WITH: ALISA RAWLINS @ [id number] ON 02/01/2016 BY CAF     Coagulation Studies:  Recent Labs  01/31/16 1956  LABPROT 16.7*  INR 1.34    Urinalysis:  Recent Labs  01/31/16 2136  COLORURINE RED*  LABSPEC 1.017  PHURINE 6.0  GLUCOSEU NEGATIVE  HGBUR 3+*  BILIRUBINUR NEGATIVE  KETONESUR NEGATIVE  PROTEINUR 100*  NITRITE NEGATIVE  LEUKOCYTESUR 3+*      Imaging: US Renal  Result Date: 02/01/2016 CLINICAL DATA:  Right nephrectomy.  Renal  failure. EXAM: RENAL / URINARY TRACT ULTRASOUND COMPLETE COMPARISON:  KUB 05/19/2015.  CT 06/14/2014 . FINDINGS: Right Kidney: Right nephrectomy. Left Kidney: Length: 10.7 cm. Left lower renal pole is inhomogeneous with indistinct corticomedullary junction. A questionable ill-defined mass noted in the lower pole the left kidney. No hydronephrosis. Bladder: Appears normal for degree of bladder distention. Left pleural effusion is noted. IMPRESSION: 1. Possible left lower renal pole mass lesion. Gadolinium-enhanced renal MRI suggested for further evaluation. 2.  Right nephrectomy. 3.  Left pleural effusion. Electronically Signed   By: Marcello Moores  Register   On: 02/01/2016 14:52   Dg Chest Port 1 View  Result Date: 02/02/2016 CLINICAL DATA:  Heart failure.  Follow-up. EXAM: PORTABLE CHEST 1 VIEW COMPARISON:  02/01/2016 FINDINGS: Central line is unchanged. Diffuse interstitial and alveolar edema persists, similar or even slightly worsened when compared to the previous films. Some lower lobe volume loss. IMPRESSION: Persistent pulmonary edema.  The same or slightly worsened. Electronically Signed   By: Nelson Chimes M.D.   On: 02/02/2016 09:34   Dg Chest Port 1 View  Result Date: 02/01/2016 CLINICAL DATA:  Encounter for central line placement. EXAM: PORTABLE CHEST 1 VIEW COMPARISON:  02/01/2016 at 4:15 a.m. FINDINGS: There is a new right internal jugular dual-lumen central venous catheter. The distal catheter tip projects in the right atrium. No pneumothorax. No change in the bilateral interstitial airspace lung opacities consistent with pulmonary edema. IMPRESSION: 1. Right internal jugular dual-lumen central venous catheter. Distal tip projects in the right atrium. No pneumothorax. Electronically Signed   By: Lajean Manes M.D.   On: 02/01/2016 12:38     Medications:     . cefTRIAXone (ROCEPHIN)  IV  1 g Intravenous Q24H  . chlorhexidine  15 mL Mouth Rinse BID  . famotidine (PEPCID) IV  20 mg Intravenous  Q24H  . feeding supplement (JEVITY 1.5 CAL/FIBER)  237 mL Per Tube QID  . ipratropium-albuterol  3 mL Nebulization Q6H  . mouth rinse  15 mL Mouth Rinse q12n4p  . predniSONE  30 mg Oral Q breakfast  . sertraline  50 mg Oral Daily   sodium chloride, ipratropium-albuterol  Assessment/ Plan:  75 y.o. 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 Elyse Hsu on 02/07/2016.   1. ARF, oliguric 2. CKD st 3, Baseline Cr 2.0 From 11/23/15. GFR 33.  3. Solitary left Kidney 4. Low grade Ta bladder cancer with left renal pelvic mass s/p multiple ablations 5. Acute pulmonary edema 6. Anemia, requiring blood transfusion 7. NSTEMI/ ACS  ARF is likely multifactorial, ATN. Renal U.S is neg for obstruction. Bactrim use from last week could have contributed to ARF  UOP has improved today, but S Creatinine and BUN are worse His respiratory status has improved. Off of BiPAP Electrolytes and Volume status are acceptable No acute indication for Dialysis at present   Case discussed with DrSudini     LOS: 3 Mauriah Mcmillen 9/21/201711:01 AM

## 2016-02-04 ENCOUNTER — Inpatient Hospital Stay: Payer: PPO

## 2016-02-04 DIAGNOSIS — R609 Edema, unspecified: Secondary | ICD-10-CM

## 2016-02-04 DIAGNOSIS — I5031 Acute diastolic (congestive) heart failure: Secondary | ICD-10-CM

## 2016-02-04 DIAGNOSIS — R0600 Dyspnea, unspecified: Secondary | ICD-10-CM

## 2016-02-04 LAB — URINE CULTURE

## 2016-02-04 LAB — BASIC METABOLIC PANEL
ANION GAP: 13 (ref 5–15)
BUN: 93 mg/dL — AB (ref 6–20)
CHLORIDE: 102 mmol/L (ref 101–111)
CO2: 27 mmol/L (ref 22–32)
Calcium: 8.5 mg/dL — ABNORMAL LOW (ref 8.9–10.3)
Creatinine, Ser: 3.97 mg/dL — ABNORMAL HIGH (ref 0.61–1.24)
GFR calc Af Amer: 16 mL/min — ABNORMAL LOW (ref >60)
GFR calc non Af Amer: 14 mL/min — ABNORMAL LOW (ref >60)
GLUCOSE: 154 mg/dL — AB (ref 65–99)
POTASSIUM: 4.9 mmol/L (ref 3.5–5.1)
SODIUM: 142 mmol/L (ref 135–145)

## 2016-02-04 LAB — CBC
HEMATOCRIT: 27.4 % — AB (ref 40.0–52.0)
HEMOGLOBIN: 9 g/dL — AB (ref 13.0–18.0)
MCH: 29.3 pg (ref 26.0–34.0)
MCHC: 32.8 g/dL (ref 32.0–36.0)
MCV: 89.3 fL (ref 80.0–100.0)
Platelets: 285 10*3/uL (ref 150–440)
RBC: 3.07 MIL/uL — ABNORMAL LOW (ref 4.40–5.90)
RDW: 15.3 % — AB (ref 11.5–14.5)
WBC: 15.5 10*3/uL — AB (ref 3.8–10.6)

## 2016-02-04 MED ORDER — CLINDAMYCIN PHOSPHATE 600 MG/50ML IV SOLN
600.0000 mg | Freq: Three times a day (TID) | INTRAVENOUS | Status: DC
  Administered 2016-02-04 – 2016-02-07 (×9): 600 mg via INTRAVENOUS
  Filled 2016-02-04 (×11): qty 50

## 2016-02-04 MED ORDER — FREE WATER
200.0000 mL | Freq: Four times a day (QID) | Status: DC
  Administered 2016-02-04 – 2016-02-07 (×12): 200 mL

## 2016-02-04 NOTE — Progress Notes (Signed)
Pre dialysis  

## 2016-02-04 NOTE — Progress Notes (Signed)
PULMONARY / CRITICAL CARE MEDICINE   Name: Matthew Poole MRN: [id number] DOB: 06/07/1940    ADMISSION DATE:  01/31/2016 CONSULTATION DATE:  01/31/16  REFERRING MD:  Dr. Edd Fabian  CHIEF COMPLAINT:  Acute Shortness of breath  DISCUSSION: 75 year old male with known history of renal carcinoma presents with acute shortness of breath possibly related to pulmonary edema,bilateral pleural effusion and major cardiomegaly. a Patient is actively bleeding from foley catheter. Patient is placed on BiPAP. Seems comfortable now. On 4l n/c. Doing well.  SUBJECTIVE:  Had an uneventful night. Rested comfortably.back to baseline mentation, breathing is good per patient.   VITAL SIGNS: BP 118/74 (BP Location: Left Arm)   Pulse (!) 107   Temp 97.8 F (36.6 C)   Resp 18   Ht 6' (1.829 m)   Wt 136 lb 11.2 oz (62 kg)   SpO2 91%   BMI 18.54 kg/m   HEMODYNAMICS:    VENTILATOR SETTINGS:    INTAKE / OUTPUT: I/O last 3 completed shifts: In: [id number] [I.V.:319; Other:63; NG/GT:235; IV Piggyback:100] Out: 40 [Urine:925]  PHYSICAL EXAMINATION: General:  Elderly white male, appears with respiratory accessory muscle use Neuro:  Alert, oriented x2, follows commands HEENT:  Atraumatic, normocephalic, no JVD Cardiovascular:  S1, S2, tachycardic, regular rhythm, no murmurs/gallops/rubs noted Lungs:  Mild coarse upper airway sounds, dec BS bilaterally.  Abdomen:  Soft, nontender, nondistended  Musculoskeletal: No inflammation/deformy noted.  No cyanosis, clubbing, edema.  Active ROM all extremities   Skin:  Grossly intact, no apparent lesions, rashes, or ulcers.  LABS:  BMET  Recent Labs Lab 02/02/16 0330 02/03/16 0446 02/04/16 0515  NA 141 141 142  K 4.1 4.6 4.9  CL 103 103 102  CO2 25 29 27   BUN 57* 76* 93*  CREATININE 3.32* 4.08* 3.97*  GLUCOSE 134* 128* 154*    Electrolytes  Recent Labs Lab 02/01/16 0332  02/02/16 0330 02/03/16 0446 02/04/16 0515  CALCIUM 8.0*  < > 8.1* 8.4*  8.5*  MG 2.5*  --   --   --   --   PHOS 5.3*  --   --   --   --   < > = values in this interval not displayed.  CBC  Recent Labs Lab 02/02/16 0330 02/03/16 0446 02/04/16 0515  WBC 14.3* 13.7* 15.5*  HGB 8.2* 8.6* 9.0*  HCT 25.1* 25.8* 27.4*  PLT 266 265 285    Coag's  Recent Labs Lab 01/31/16 1956  APTT 42*  INR 1.34    Sepsis Markers  Recent Labs Lab 01/31/16 1958 01/31/16 2350 02/01/16 0332 02/02/16 0330  LATICACIDVEN 2.7* 2.0*  --   --   PROCALCITON  --   --  0.27 0.36    ABG  Recent Labs Lab 01/31/16 2210  PHART 7.42  PCO2ART 31*  PO2ART 338*    Liver Enzymes  Recent Labs Lab 01/31/16 1956  AST 24  ALT 11*  ALKPHOS 99  BILITOT 0.5  ALBUMIN 2.5*    Cardiac Enzymes  Recent Labs Lab 01/31/16 2341 02/01/16 0332 02/01/16 1138  TROPONINI 1.32* 1.30* 1.32*    Glucose  Recent Labs Lab 02/01/16 1611 02/01/16 2226 02/02/16 0433 02/02/16 0728 02/02/16 1150 02/02/16 1619  GLUCAP 178* 127* 121* 128* 126* 155*    Imaging Dg Chest 2 View  Result Date: 02/04/2016 CLINICAL DATA:  Difficulty swallowing. EXAM: CHEST  2 VIEW COMPARISON:  None 22,017. FINDINGS: Dual lumen right IJ line noted on the right. Tip at cavoatrial junction Cardiomegaly with diffuse  prominent bilateral pulmonary interstitial prominence and small bilateral pleural effusions consistent with congestive heart failure. Low lung volumes. Radiopacities scratched noted over the upper abdomen of uncertain etiology. No acute bony abnormality. IMPRESSION: 1. Dual-lumen right IJ line noted with tip at cavoatrial junction. 2. Congestive heart failure with prominent bilateral pulmonary interstitial edema and small bilateral pleural effusions. Electronically Signed   By: Marcello Moores  Register   On: 02/04/2016 10:59     STUDIES:  9/19 Echocardiogram>>Left ventricle: Systolic function was severely reduced. The  estimated ejection fraction was in the range of 25% to 30%. Aortic valve: There  was mild regurgitation. Valve area (Vmax): 2.95 cm^2. Mitral valve: There was moderate to severe regurgitation.  9/19 Renal US>>Possible left lower renal pole mass lesion. Gadolinium-enhanced renal MRI suggested for further evaluation.  CULTURES: 9/18 Urine culture>> 9/18 Blood culture>> 9/19 MRSA by PCR>>positive   ANTIBIOTICS: 9/18 Vancomycin>> 9/18 Aztreonam>> 9/19 Ceftriaxone>>  SIGNIFICANT EVENTS: 64/47>> 75 year old male with renal carcinoma admitted to the ICU on BiPAP due to pulmonary edema/?sepsis related to UTI.  9/19>> off BiPAP transitioned to [place]  LINES/TUBES: 9/19>>RIJ Vascath  ASSESSMENT / PLAN:  PULMONARY A: Acute respiratory failure possibly related to pulmonary edema Hx of COPD Interstitial edema secondary to CHF P:   Continue to support with oxygen to keep sats greater than 90%  DuoNeb PRN and scheduled Prednisone taper CXR as needed Majority of his pulmonary symptoms are related to CHF for which he is being treated.  No acute pulmonary or critical care needs warranted at this time. We will signoff. Thank you.    CARDIOVASCULAR A:  CHF, BNP >4500 Cardiomegaly Elevated troponins r/t demand ischemia ST depression in V4, V5, V6.   P:  9/19 Echo>>Left ventricle: Systolic function was severely reduced. The  estimated ejection fraction was in the range of 25% to 30%.  Cardiology following Trend troponin -not a candidate to receive heparin per cardiology due to bleeding.  RENAL A:   Acute on chronic renal disorder Renal cell carcinoma Chronic Hematuria  P:   9/19 Received HD 9/19 RIJ Vascath placed Nephrology following Replace electrolytes per ICU protocol Avoid nephrotoxic drugs  GASTROINTESTINAL A:   Indwelling G-tube History of GERD P:   Nothing by mouth for now continue famotidine Bolus tube  feeds  HEMATOLOGIC A:   Acute blood loss anemia P:  9/19 Received 1 unit PRBCs teds and SCD's Transfuse per ICU protocol Trend  H/H  INFECTIOUS A:   Sepsis possibly related to UTI, UA with pyuria Leukocytosis P:   Monitor fever curve Vancomycin/aztreonam/Ceftriaxone Follow cultures  ENDOCRINE A:   Hyperglycemia P:   Blood sugar checks every 4 hours SSI Coverage  NEUROLOGIC A:   History of mental retardation P:   Minimize sedating drugs Continue sertaline  Thank you for consulting [facility], we will signoff at this time.  Please feel free to contact us with any questions at [phone] (please enter 7-digits).   Pulmonary Care Time devoted to patient care services described in this note is  35 Minutes.   This time reflects time of care of this signee Dr Vilinda Boehringer.  This critical care time does not reflect procedure time, or teaching time or supervisory time of PA/NP/Med-student/Med Resident etc but could involve care discussion time.  Vilinda Boehringer, MD [facility] Pager [phone] (please enter 7-digits) On Call Pager - [phone] (please enter 7-digits)  Note: This note was prepared with Viviann Spare  dictation along with smaller phrase technology. Any transcriptional errors that result from this process are unintentional.

## 2016-02-04 NOTE — Progress Notes (Signed)
Palliative:  Chart reviewed and Matthew Poole receiving dialysis. Unfortunately urine output decreased and concern with fluid build up and receiving dialysis again today. I have previously discussed with guardian who endorsed extremely poor QOL for Matthew Poole and did not desire aggressive measures to prolong his life. Multiple comorbitities and concerned he will continue to decline and long term dialysis not desired. Guardian was accepting of hospice if recommended by medical team.   No Charge  Vinie Sill, NP Palliative Medicine Team Pager # [phone] (M-F 8a-5p) Team Phone # [phone] (Nights/Weekends)

## 2016-02-04 NOTE — Progress Notes (Signed)
[facility] NAME: Matthew Poole    MR#:  [id number]  DATE OF BIRTH:  04/26/1941  SUBJECTIVE:  CHIEF COMPLAINT:   Chief Complaint  Patient presents with  . Shortness of Breath   Admitted with shortness of breath, CHF and acute kidney injury. Was on a BiPAP and now transitioned to nasal cannula. Transferred out of ICU.  No concerns. Patient has cognitive impairment limiting history.  REVIEW OF SYSTEMS:  Unable to give ROS due to baseline mental status. ROS  DRUG ALLERGIES:   Allergies  Allergen Reactions  . Penicillins Itching, Nausea And Vomiting, Rash and Other (See Comments)    Unable to obtain enough information to answer additional questions about this medication.      VITALS:  Blood pressure 118/74, pulse (!) 107, temperature 97.8 F (36.6 C), resp. rate 18, height 6' (1.829 m), weight 62 kg (136 lb 11.2 oz), SpO2 91 %.  PHYSICAL EXAMINATION:  GENERAL:  75 y.o.-year-old patient lying in the bed with no acute distress.  EYES: Pupils equal, round, reactive to light and accommodation. No scleral icterus. Extraocular muscles intact.  HEENT: Head atraumatic, normocephalic. Oropharynx and nasopharynx clear.  NECK:  Supple, no jugular venous distention. No thyroid enlargement, no tenderness.  LUNGS: Normal breath sounds bilaterally, some wheezing, b/l crepitation. No use of accessory muscles of respiration.  CARDIOVASCULAR: S1, S2 normal. No murmurs, rubs, or gallops.  ABDOMEN: Soft, nontender, nondistended. Bowel sounds present. No organomegaly or mass.  Foley in place with hematuria EXTREMITIES: No pedal edema, cyanosis, or clubbing.  NEUROLOGIC: Cranial nerves II through XII are intact. Muscle strength 4/5 in all extremities. Sensation intact. Gait not checked.  PSYCHIATRIC: The patient is alert and awake. He answers simple questions. SKIN: No obvious rash, lesion, or ulcer.   Physical Exam LABORATORY PANEL:    CBC  Recent Labs Lab 02/04/16 0515  WBC 15.5*  HGB 9.0*  HCT 27.4*  PLT 285   ------------------------------------------------------------------------------------------------------------------  Chemistries   Recent Labs Lab 01/31/16 1956 02/01/16 0332  02/04/16 0515  NA 138 138  < > 142  K 4.5 4.5  < > 4.9  CL 105 107  < > 102  CO2 21* 21*  < > 27  GLUCOSE 293* 157*  < > 154*  BUN 70* 71*  < > 93*  CREATININE 4.63* 4.56*  < > 3.97*  CALCIUM 8.0* 8.0*  < > 8.5*  MG  --  2.5*  --   --   AST 24  --   --   --   ALT 11*  --   --   --   ALKPHOS 99  --   --   --   BILITOT 0.5  --   --   --   < > = values in this interval not displayed. ------------------------------------------------------------------------------------------------------------------  Cardiac Enzymes  Recent Labs Lab 02/01/16 0332 02/01/16 1138  TROPONINI 1.30* 1.32*   ------------------------------------------------------------------------------------------------------------------  RADIOLOGY:  Dg Chest 2 View  Result Date: 02/04/2016 CLINICAL DATA:  Difficulty swallowing. EXAM: CHEST  2 VIEW COMPARISON:  None 22,017. FINDINGS: Dual lumen right IJ line noted on the right. Tip at cavoatrial junction Cardiomegaly with diffuse prominent bilateral pulmonary interstitial prominence and small bilateral pleural effusions consistent with congestive heart failure. Low lung volumes. Radiopacities scratched noted over the upper abdomen of uncertain etiology. No acute bony abnormality. IMPRESSION: 1. Dual-lumen right IJ line noted with tip at cavoatrial junction. 2. Congestive heart failure with  prominent bilateral pulmonary interstitial edema and small bilateral pleural effusions. Electronically Signed   By: [place]   On: 02/04/2016 10:59    ASSESSMENT AND PLAN:   Active Problems:   Acute respiratory failure ([place])   Palliative care encounter   DNR (do not resuscitate)  * Acute hypoxic respiratory  failure   Due to CHF exacerbation.    Much improved after one session of HD.     Now on nasal canula oxygen.  Still has SOB and crackles on exam.  Repeat CXR today still shows edema.  * Ac on ch renal failure, hematuria, UTI   Has chronic urological issues   Ceftriaxone   [facility]  * Sepsis- resolved   On presentation- lactic acid high, tachycardia, elevated WBcs   Due to UTI  * Acute on chronic systolic CHF  Ef 25 % ac systolic No Lasix at this time due to acute kidney injury  * Acute on chronic anemia with hematuira   Transfused one unit in ICU   * Elevated troponin   With significant hematuria, not a candidate for anticoagulation.   Likely this may be due to stress of CHF and renal failure with infection.  Poor long-term prognosis    All the records are reviewed and case discussed with Care Management/Social Workerr. Management plans discussed with the patient, family and they are in agreement.  CODE STATUS: DNR  TOTAL TIME TAKING CARE OF THIS PATIENT: 35 minutes.   POSSIBLE D/C IN 3-4 DAYS, DEPENDING ON CLINICAL CONDITION.  Hillary Bow R M.D on 02/04/2016   Between 7am to 6pm - Pager - [phone]  After 6pm go to [email or web address] - password [email or web address] [facility]  Office  [phone]  CC: Primary care physician; [name], MD  Note: This dictation was prepared with Dragon dictation along with smaller phrase technology. Any transcriptional errors that result from this process are unintentional.

## 2016-02-04 NOTE — Progress Notes (Signed)
[facility] Encounter Note  Patient: Matthew Poole / Admit Date: 01/31/2016 / Date of Encounter: 02/04/2016, 8:22 AM   Subjective: Patient with continued shortness of breath with any activity although helped with oxygen supplementation. No evidence of chest discomfort. Lower extremity edema improved.  Review of Systems: Positive for: Shortness of breath hematuria Negative for: Vision change, hearing change, syncope, dizziness, nausea, vomiting,diarrhea, bloody stool, stomach pain, cough, congestion, diaphoresis, urinary frequency, urinary pain,skin lesions, skin rashes Others previously listed  Objective: Telemetry: Normal sinus rhythm Physical Exam: Blood pressure 118/74, pulse (!) 107, temperature 97.8 F (36.6 C), resp. rate 18, height 6' (1.829 m), weight 136 lb 11.2 oz (62 kg), SpO2 91 %. Body mass index is 18.54 kg/m. General: Well developed, well nourished, in no acute distress. Head: Normocephalic, atraumatic, sclera non-icteric, no xanthomas, nares are without discharge. Neck: No apparent masses Lungs: Normal respirations with no wheezes, some rhonchi, no rales , basilar crackles   Heart: Regular rate and rhythm, normal S1 S2, no murmur, no rub, no gallop, PMI is normal size and placement, carotid upstroke normal without bruit, jugular venous pressure normal Abdomen: Soft, non-tender, non-distended with normoactive bowel sounds. No hepatosplenomegaly. Abdominal aorta is normal size without bruit Extremities: Trace edema, no clubbing, no cyanosis, no ulcers,  Peripheral: 2+ radial, 2+ femoral, 2+ dorsal pedal pulses Neuro: Alert and oriented. Moves all extremities spontaneously. Psych:  Responds to questions appropriately with a normal affect.   Intake/Output Summary (Last 24 hours) at 02/04/16 [id number] Last data filed at 02/04/16 0457  Gross per 24 hour  Intake              218 ml  Output              375 ml  Net             -157 ml    Inpatient  Medications:  . cefTRIAXone (ROCEPHIN)  IV  1 g Intravenous Q24H  . chlorhexidine  15 mL Mouth Rinse BID  . famotidine (PEPCID) IV  20 mg Intravenous Q24H  . feeding supplement (JEVITY 1.5 CAL/FIBER)  237-474 mL Per Tube QID  . ipratropium-albuterol  3 mL Nebulization Q6H  . mouth rinse  15 mL Mouth Rinse q12n4p  . predniSONE  30 mg Oral Q breakfast  . sertraline  50 mg Oral Daily   Infusions:    Labs:  Recent Labs  02/03/16 0446 02/04/16 0515  NA 141 142  K 4.6 4.9  CL 103 102  CO2 29 27  GLUCOSE 128* 154*  BUN 76* 93*  CREATININE 4.08* 3.97*  CALCIUM 8.4* 8.5*   No results for input(s): AST, ALT, ALKPHOS, BILITOT, PROT, ALBUMIN in the last 72 hours.  Recent Labs  02/03/16 0446 02/04/16 0515  WBC 13.7* 15.5*  HGB 8.6* 9.0*  HCT 25.8* 27.4*  MCV 88.5 89.3  PLT 265 285    Recent Labs  2016/02/05 1138  TROPONINI 1.32*   Invalid input(s): POCBNP No results for input(s): HGBA1C in the last 72 hours.   Weights: Filed Weights   02/02/16 0500 02/03/16 0500 02/04/16 0500  Weight: 143 lb 1.3 oz (64.9 kg) 135 lb 11.2 oz (61.6 kg) 136 lb 11.2 oz (62 kg)     Radiology/Studies:  US Renal  Result Date: 2016/02/05 CLINICAL DATA:  Right nephrectomy.  Renal failure. EXAM: RENAL / URINARY TRACT ULTRASOUND COMPLETE COMPARISON:  KUB 05/19/2015.  CT 06/14/2014 . FINDINGS: Right Kidney: Right nephrectomy. Left Kidney: Length: 10.7 cm.  Left lower renal pole is inhomogeneous with indistinct corticomedullary junction. A questionable ill-defined mass noted in the lower pole the left kidney. No hydronephrosis. Bladder: Appears normal for degree of bladder distention. Left pleural effusion is noted. IMPRESSION: 1. Possible left lower renal pole mass lesion. Gadolinium-enhanced renal MRI suggested for further evaluation. 2.  Right nephrectomy. 3.  Left pleural effusion. Electronically Signed   By: Marcello Moores  Register   On: 02/01/2016 14:52   Dg Chest Port 1 View  Result Date:  02/02/2016 CLINICAL DATA:  Heart failure.  Follow-up. EXAM: PORTABLE CHEST 1 VIEW COMPARISON:  02/01/2016 FINDINGS: Central line is unchanged. Diffuse interstitial and alveolar edema persists, similar or even slightly worsened when compared to the previous films. Some lower lobe volume loss. IMPRESSION: Persistent pulmonary edema.  The same or slightly worsened. Electronically Signed   By: Nelson Chimes M.D.   On: 02/02/2016 09:34   Dg Chest Port 1 View  Result Date: 02/01/2016 CLINICAL DATA:  Encounter for central line placement. EXAM: PORTABLE CHEST 1 VIEW COMPARISON:  02/01/2016 at 4:15 a.m. FINDINGS: There is a new right internal jugular dual-lumen central venous catheter. The distal catheter tip projects in the right atrium. No pneumothorax. No change in the bilateral interstitial airspace lung opacities consistent with pulmonary edema. IMPRESSION: 1. Right internal jugular dual-lumen central venous catheter. Distal tip projects in the right atrium. No pneumothorax. Electronically Signed   By: Lajean Manes M.D.   On: 02/01/2016 12:38   Dg Chest Port 1 View  Result Date: 02/01/2016 CLINICAL DATA:  Respiratory failure. EXAM: PORTABLE CHEST 1 VIEW COMPARISON:  01/31/2016. FINDINGS: Cardiomegaly with diffuse bilateral pulmonary infiltrates consistent with pulmonary edema and bilateral pleural effusions again noted with minimal improvement. No pneumothorax. IMPRESSION: Persistent congestive heart failure bilateral pulmonary edema and small pleural effusions. Minimal interval change. Electronically Signed   By: Marcello Moores  Register   On: 02/01/2016 07:07   Dg Chest Port 1 View  Result Date: 01/31/2016 CLINICAL DATA:  Shortness of breath and decreased oxygen saturation on oxygen. Current treatment for urinary tract infection. Smoker. EXAM: PORTABLE CHEST 1 VIEW COMPARISON:  04/25/2015 FINDINGS: Cardiac enlargement with diffuse pulmonary vascular congestion. Bilateral perihilar airspace and diffuse interstitial  disease consistent with edema. Bilateral pneumonia also possible but felt less likely due vascular congestion and cardiac enlargement. Small bilateral pleural effusions. No pneumothorax. IMPRESSION: Cardiac enlargement with pulmonary vascular congestion, diffuse bilateral pulmonary edema, and small pleural effusions. Electronically Signed   By: Lucienne Capers M.D.   On: 01/31/2016 21:16     Assessment and Recommendation  75 y.o. male with acute on chronic systolic dysfunction congestive heart failure with the non-ST elevation myocardial infarction due to significant hypoxia and anemia as well as acute to renal insufficiency due to hematuria status post urologic procedure. Patient is improving at this time on appropriate supportive management without evidence of significant anginal symptoms or other concerns requiring further cardiac intervention 1. Continue gentle diuresis as able as per nephrology for concerns of severe kidney disease and pulmonary edema 2. Continue supportive care of anemia from hematuria slowly improving at this time 3. No further cardiac diagnostics necessary at this time 4. No restrictions to rehabilitation and physical activity 5. Ambulation and follow for further symptoms requiring additional treatment options 6. Call if further questions  Signed, Serafina Royals M.D. FACC

## 2016-02-04 NOTE — Progress Notes (Signed)
Pre Dialysis Assesment

## 2016-02-04 NOTE — Progress Notes (Addendum)
Subjective:   Denies acute c/o sitting up in chair UOP is low last 24 hrs denies SOB   Objective:  Vital signs in last 24 hours:  Temp:  [97.5 F (36.4 C)-98.2 F (36.8 C)] 98.2 F (36.8 C) (09/22 1400) Pulse Rate:  [101-117] 111 (09/22 1400) Resp:  [18-24] 24 (09/22 1400) BP: (102-120)/(65-74) 102/66 (09/22 1400) SpO2:  [91 %-100 %] 100 % (09/22 1400) Weight:  [62 kg (136 lb 11.2 oz)-62.6 kg (138 lb 0.1 oz)] 62.6 kg (138 lb 0.1 oz) (09/22 1400)  Weight change: 0.454 kg (1 lb) Filed Weights   02/03/16 0500 02/04/16 0500 02/04/16 1400  Weight: 61.6 kg (135 lb 11.2 oz) 62 kg (136 lb 11.2 oz) 62.6 kg (138 lb 0.1 oz)    Intake/Output:    Intake/Output Summary (Last 24 hours) at 02/04/16 1406 Last data filed at 02/04/16 1300  Gross per 24 hour  Intake              218 ml  Output              375 ml  Net             -157 ml     Physical Exam: General: NAD, sitting up    HEENT Anicteric, [place] O2  Neck supple  Pulm/lungs Coarse crackles b/l,   CVS/Heart irregular  Abdomen:  Soft, NT, J tube in place  Extremities: Trace edema  Neurologic: Alert, follows commands  Skin: Warm dry  Access: Rt IJ temp cath       Basic Metabolic Panel:   Recent Labs Lab 02/01/16 0332 02/01/16 1430 02/02/16 0330 02/03/16 0446 02/04/16 0515  NA 138 137 141 141 142  K 4.5 5.1 4.1 4.6 4.9  CL 107 104 103 103 102  CO2 21* 21* 25 29 27   GLUCOSE 157* 197* 134* 128* 154*  BUN 71* 81* 57* 76* 93*  CREATININE 4.56* 4.51* 3.32* 4.08* 3.97*  CALCIUM 8.0* 8.1* 8.1* 8.4* 8.5*  MG 2.5*  --   --   --   --   PHOS 5.3*  --   --   --   --      CBC:  Recent Labs Lab 01/31/16 1956 02/01/16 0332 02/02/16 0330 02/03/16 0446 02/04/16 0515  WBC 11.0* 9.9 14.3* 13.7* 15.5*  NEUTROABS 9.1*  --   --   --   --   HGB 7.7* 7.4* 8.2* 8.6* 9.0*  HCT 24.2* 22.1* 25.1* 25.8* 27.4*  MCV 89.2 88.1 87.1 88.5 89.3  PLT 263 251 266 265 285      Microbiology:  Recent Results (from the past  720 hour(s))  Blood culture (routine x 2)     Status: None (Preliminary result)   Collection Time: 01/31/16  8:00 PM  Result Value Ref Range Status   Specimen Description BLOOD  LEFT HAND  Final   Special Requests   Final    BOTTLES DRAWN AEROBIC AND ANAEROBIC  AER 3CC ANA 2CC   Culture NO GROWTH 4 DAYS  Final   Report Status PENDING  Incomplete  Blood culture (routine x 2)     Status: None (Preliminary result)   Collection Time: 01/31/16  8:03 PM  Result Value Ref Range Status   Specimen Description BLOOD  R WRIST  Final   Special Requests BOTTLES DRAWN AEROBIC AND ANAEROBIC  AER4CC ANA3CC  Final   Culture NO GROWTH 4 DAYS  Final   Report Status PENDING  Incomplete  Urine  culture     Status: Abnormal   Collection Time: 01/31/16  9:36 PM  Result Value Ref Range Status   Specimen Description URINE, CLEAN CATCH  Final   Special Requests NONE  Final   Culture (A)  Final    >=100,000 COLONIES/mL METHICILLIN RESISTANT STAPHYLOCOCCUS AUREUS 50,000 COLONIES/mL ESCHERICHIA COLI    Report Status 02/04/2016 FINAL  Final   Organism ID, Bacteria METHICILLIN RESISTANT STAPHYLOCOCCUS AUREUS (A)  Final   Organism ID, Bacteria ESCHERICHIA COLI (A)  Final      Susceptibility   Escherichia coli - MIC*    AMPICILLIN 4 SENSITIVE Sensitive     CEFAZOLIN <=4 SENSITIVE Sensitive     CEFTRIAXONE <=1 SENSITIVE Sensitive     CIPROFLOXACIN >=4 RESISTANT Resistant     GENTAMICIN <=1 SENSITIVE Sensitive     IMIPENEM <=0.25 SENSITIVE Sensitive     NITROFURANTOIN <=16 SENSITIVE Sensitive     TRIMETH/SULFA <=20 SENSITIVE Sensitive     AMPICILLIN/SULBACTAM <=2 SENSITIVE Sensitive     PIP/TAZO <=4 SENSITIVE Sensitive     Extended ESBL NEGATIVE Sensitive     * 50,000 COLONIES/mL ESCHERICHIA COLI   Methicillin resistant staphylococcus aureus - MIC*    CIPROFLOXACIN >=8 RESISTANT Resistant     GENTAMICIN <=0.5 SENSITIVE Sensitive     NITROFURANTOIN <=16 SENSITIVE Sensitive     OXACILLIN >=4 RESISTANT  Resistant     TETRACYCLINE <=1 SENSITIVE Sensitive     VANCOMYCIN 1 SENSITIVE Sensitive     TRIMETH/SULFA <=10 SENSITIVE Sensitive     CLINDAMYCIN <=0.25 SENSITIVE Sensitive     RIFAMPIN <=0.5 SENSITIVE Sensitive     Inducible Clindamycin NEGATIVE Sensitive     * >=100,000 COLONIES/mL METHICILLIN RESISTANT STAPHYLOCOCCUS AUREUS  MRSA PCR Screening     Status: Abnormal   Collection Time: 02/01/16 12:52 AM  Result Value Ref Range Status   MRSA by PCR POSITIVE (A) NEGATIVE Final    Comment:        The GeneXpert MRSA Assay (FDA approved for NASAL specimens only), is one component of a comprehensive MRSA colonization surveillance program. It is not intended to diagnose MRSA infection nor to guide or monitor treatment for MRSA infections. RESULT CALLED TO, READ BACK BY AND VERIFIED WITH: ALISA RAWLINS @ [id number] ON 02/01/2016 BY CAF     Coagulation Studies: No results for input(s): LABPROT, INR in the last 72 hours.  Urinalysis: No results for input(s): COLORURINE, LABSPEC, PHURINE, GLUCOSEU, HGBUR, BILIRUBINUR, KETONESUR, PROTEINUR, UROBILINOGEN, NITRITE, LEUKOCYTESUR in the last 72 hours.  Invalid input(s): APPERANCEUR    Imaging: Dg Chest 2 View  Result Date: 02/04/2016 CLINICAL DATA:  Difficulty swallowing. EXAM: CHEST  2 VIEW COMPARISON:  None 22,017. FINDINGS: Dual lumen right IJ line noted on the right. Tip at cavoatrial junction Cardiomegaly with diffuse prominent bilateral pulmonary interstitial prominence and small bilateral pleural effusions consistent with congestive heart failure. Low lung volumes. Radiopacities scratched noted over the upper abdomen of uncertain etiology. No acute bony abnormality. IMPRESSION: 1. Dual-lumen right IJ line noted with tip at cavoatrial junction. 2. Congestive heart failure with prominent bilateral pulmonary interstitial edema and small bilateral pleural effusions. Electronically Signed   By: Marcello Moores  Register   On: 02/04/2016 10:59      Medications:     . cefTRIAXone (ROCEPHIN)  IV  1 g Intravenous Q24H  . chlorhexidine  15 mL Mouth Rinse BID  . clindamycin (CLEOCIN) IV  600 mg Intravenous Q8H  . famotidine (PEPCID) IV  20 mg Intravenous Q24H  .  feeding supplement (JEVITY 1.5 CAL/FIBER)  237-474 mL Per Tube QID  . free water  200 mL Per Tube Q6H  . ipratropium-albuterol  3 mL Nebulization Q6H  . mouth rinse  15 mL Mouth Rinse q12n4p  . predniSONE  30 mg Oral Q breakfast  . sertraline  50 mg Oral Daily   sodium chloride, ipratropium-albuterol  Assessment/ Plan:  75 y.o. 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 Elyse Hsu on 02/07/2016.   1. ARF, oliguric 2. CKD st 3, Baseline Cr 2.0 From 11/23/15. GFR 33.  3. Solitary left Kidney 4. Low grade Ta bladder cancer with left renal pelvic mass s/p multiple ablations 5. Acute pulmonary edema 6. Anemia, requiring blood transfusion 7. NSTEMI/ ACS 8. MRSA, E Coli UTI  ARF is likely multifactorial, ATN. Renal U.S is neg for obstruction. Bactrim use from week prior to admission could have contributed to ARF  UOPis low today, but S Creatinine and BUN are worse (BUN increased likely due to steroids) Case discussed with DrSudini Due to abnormal lung exam and CXR, will dialyze him again today UF goal upto 2000 cc Previously discussed with POA that patient would not make a good candidate for long term dialysis due to his multiple co-morbidities     LOS: 4 Lanee Chain 9/22/20172:06 PM

## 2016-02-04 NOTE — Evaluation (Signed)
Objective Swallowing Evaluation: Type of Study: MBS-Modified Barium Swallow Study  Patient Details  Name: Matthew Poole MRN: [id number] Date of Birth: 10-20-1940  Today's Date: 02/04/2016 Time: SLP Start Time (ACUTE ONLY): 1000-SLP Stop Time (ACUTE ONLY): 1100 SLP Time Calculation (min) (ACUTE ONLY): 60 min  Past Medical History:  Past Medical History:  Diagnosis Date  . Anemia   . Cancer (HCC)    renal cell carcinoma  . COPD (chronic obstructive pulmonary disease) ([place])   . Depression   . Difficulty walking   . Dysphagia, oropharyngeal   . G tube feedings ([place])   . GERD (gastroesophageal reflux disease)   . Mental retardation   . Muscle weakness   . Osteoporosis   . Psoriasis   . Psoriasis   . Reflux   . Renal cell carcinoma ([place])   . Renal disorder    acute on chronic renal disorder  . Sepsis secondary to UTI [facility])    Past Surgical History:  Past Surgical History:  Procedure Laterality Date  . PEG PLACEMENT     HPI: Pt is a 75 years old male with known history of renal cell carcinoma s/p multiple ablations, COPD, GERD, mental retardation and dysphagia- indwelling G-tube.. Patient presented to the ED via EMS due to shortness of breath. His oxygen sats were down to 80s. EMS gave albuterol X1, Solu-Medrol-125MG . Patient is scheduled to undergo cystouretroscopy a and possible tumor fulguration, and left possible urethral stent exchange on 9/25 therefore patient was on bactrim and ciprofloxacin pro phylactically. Patient was persistently  bleeding from his indwelling foley.  Marland KitchenUpon arrival to the ED patient is tachycardic, tachypneic, Cr-4.63, troponin I-1.46. CXR was concerning for pulmonary edema and major cardiomegaly. Patient was placed on BiPAP and pt was admitted.   Subjective: pt awake, verbally responsive following basic instructions given. Noted congested breathing and vocal quality intermittently.    Assessment / Plan / Recommendation  CHL IP CLINICAL IMPRESSIONS  02/04/2016  Therapy Diagnosis Severe pharyngeal phase dysphagia;Mild oral phase dysphagia  Clinical Impression Pt is a 75 year old man w/ chronic PEG for nutrition/hydration needs since 04/2013. Pt has had prior MBSSs w/ recommendation for a modified diet of Dysphagia 3 w/ Nectar liquids if taking any oral intake for pleasure. During this study today, pt presented w/ severe pharyngeal phase dysphagia; mild oral phase dysphagia. During the pharyngeal phase, pt exhibited decreased hyolaryngeal movement/excursion and decreased tongue base retraction resulting in poor pharyngeal pressure. Due to these deficits, moderate to severe bolus residue remained w/ TSP trials of Nectar consistency liquid trial. F/U, dry swallows were incomplete and ineffective in clearing the pharyngeal residue. Residue from the pharyngeal residue was then penetration and aspirated - the majority of which was SILENT. When pt was verbally cued to cough, he was able to expectorate bolus residue as well as MODERATE AMOUNT of pharyngeal phlegm (brown and bloody in appearance).  During the oral phase, A-P transfer and oral clearing required min increased time; w/ time and f/u swallows oral residue reduced somewhat. Pt appears at high risk for aspiration at this time. Recommend NPO status currently w/ TFs via PEG for nutrition/hydration needs. Recommend frequent Oral Care d/t NPO status to help address oropharyngeal phlegm/secretions. This study is similar to the previous study but appears increased in severity of deficits. Results discussed w/ MD and NSG.   Impact on safety and function Severe aspiration risk;Risk for inadequate nutrition/hydration      CHL IP TREATMENT RECOMMENDATION 02/04/2016  Treatment Recommendations No treatment  recommended at this time;Defer treatment plan to f/u with SLP     Prognosis 02/04/2016  Prognosis for Safe Diet Advancement Guarded  Barriers to Reach Goals Cognitive deficits;Time post onset;Severity of deficits   Barriers/Prognosis Comment --    CHL IP DIET RECOMMENDATION 02/04/2016  SLP Diet Recommendations NPO;Alternative means - long-term;Ice chips PRN after oral care  Liquid Administration via --  Medication Administration Via alternative means  Compensations --  Postural Changes --      CHL IP OTHER RECOMMENDATIONS 02/04/2016  Recommended Consults (No Data)  Oral Care Recommendations Oral care QID;Staff/trained caregiver to provide oral care;Oral care before and after PO  Other Recommendations --      CHL IP FOLLOW UP RECOMMENDATIONS 02/04/2016  Follow up Recommendations Skilled Nursing facility      [facility] IP FREQUENCY AND DURATION 02/04/2016  Speech Therapy Frequency (ACUTE ONLY) (No Data)  Treatment Duration (No Data)           CHL IP ORAL PHASE 02/04/2016  Oral Phase Impaired  Oral - Pudding Teaspoon --  Oral - Pudding Cup --  Oral - Honey Teaspoon --  Oral - Honey Cup --  Oral - Nectar Teaspoon --  Oral - Nectar Cup --  Oral - Nectar Straw --  Oral - Thin Teaspoon --  Oral - Thin Cup --  Oral - Thin Straw --  Oral - Puree --  Oral - Mech Soft --  Oral - Regular --  Oral - Multi-Consistency --  Oral - Pill --  Oral Phase - Comment slower A-P transfer and clearing but given time residue reduced    CHL IP PHARYNGEAL PHASE 02/04/2016  Pharyngeal Phase Impaired  Pharyngeal- Pudding Teaspoon --  Pharyngeal --  Pharyngeal- Pudding Cup --  Pharyngeal --  Pharyngeal- Honey Teaspoon --  Pharyngeal --  Pharyngeal- Honey Cup --  Pharyngeal --  Pharyngeal- Nectar Teaspoon --  Pharyngeal --  Pharyngeal- Nectar Cup --  Pharyngeal --  Pharyngeal- Nectar Straw --  Pharyngeal --  Pharyngeal- Thin Teaspoon --  Pharyngeal --  Pharyngeal- Thin Cup --  Pharyngeal --  Pharyngeal- Thin Straw --  Pharyngeal --  Pharyngeal- Puree --  Pharyngeal --  Pharyngeal- Mechanical Soft --  Pharyngeal --  Pharyngeal- Regular --  Pharyngeal --  Pharyngeal- Multi-consistency --   Pharyngeal --  Pharyngeal- Pill --  Pharyngeal --  Pharyngeal Comment timing of pharyngeal swallow grossly wfl, however, pt exhibited reduced laryngeal excursion and pharyngeal pressure during the swallow w/ incomplete swallow attempts noted. Moderate-severe pharyngeal residue remained post swallow. Noted reduced velopharyngeal competency w/ bolus material moving superiorly into the nasopharyngeal cavity.     CHL IP CERVICAL ESOPHAGEAL PHASE 02/04/2016  Cervical Esophageal Phase WFL  Pudding Teaspoon --  Pudding Cup --  Honey Teaspoon --  Honey Cup --  Nectar Teaspoon --  Nectar Cup --  Nectar Straw --  Thin Teaspoon --  Thin Cup --  Thin Straw --  Puree --  Mechanical Soft --  Regular --  Multi-consistency --  Pill --  Cervical Esophageal Comment --    No flowsheet data found.  Watson,Katherine 02/04/2016, 11:35 AM Orinda Kenner, MS, CCC-SLP

## 2016-02-04 NOTE — Progress Notes (Signed)
POST DIALYSIS ASSESSMENT 

## 2016-02-04 NOTE — Progress Notes (Signed)
HD COMPLETED  

## 2016-02-04 NOTE — Progress Notes (Signed)
HD STARTED  

## 2016-02-05 LAB — CULTURE, BLOOD (ROUTINE X 2)
CULTURE: NO GROWTH
Culture: NO GROWTH

## 2016-02-05 LAB — BASIC METABOLIC PANEL
Anion gap: 10 (ref 5–15)
BUN: 70 mg/dL — AB (ref 6–20)
CHLORIDE: 99 mmol/L — AB (ref 101–111)
CO2: 30 mmol/L (ref 22–32)
Calcium: 8.6 mg/dL — ABNORMAL LOW (ref 8.9–10.3)
Creatinine, Ser: 3.51 mg/dL — ABNORMAL HIGH (ref 0.61–1.24)
GFR calc Af Amer: 18 mL/min — ABNORMAL LOW (ref >60)
GFR calc non Af Amer: 16 mL/min — ABNORMAL LOW (ref >60)
Glucose, Bld: 142 mg/dL — ABNORMAL HIGH (ref 65–99)
POTASSIUM: 4.8 mmol/L (ref 3.5–5.1)
SODIUM: 139 mmol/L (ref 135–145)

## 2016-02-05 MED ORDER — VANCOMYCIN HCL IN DEXTROSE 1-5 GM/200ML-% IV SOLN
1000.0000 mg | Freq: Once | INTRAVENOUS | Status: AC
Start: 2016-02-05 — End: 2016-02-05
  Administered 2016-02-05: 1000 mg via INTRAVENOUS
  Filled 2016-02-05: qty 200

## 2016-02-05 MED ORDER — VANCOMYCIN HCL IN DEXTROSE 750-5 MG/150ML-% IV SOLN: 750.0000 mg | INTRAVENOUS | Status: DC

## 2016-02-05 MED ORDER — VANCOMYCIN HCL IN DEXTROSE 1-5 GM/200ML-% IV SOLN: 1000.0000 mg | INTRAVENOUS | Status: DC

## 2016-02-05 MED ORDER — IPRATROPIUM-ALBUTEROL 0.5-2.5 (3) MG/3ML IN SOLN
3.0000 mL | Freq: Three times a day (TID) | RESPIRATORY_TRACT | Status: DC
  Administered 2016-02-06 – 2016-02-07 (×4): 3 mL via RESPIRATORY_TRACT
  Filled 2016-02-05 (×5): qty 3

## 2016-02-05 NOTE — Progress Notes (Signed)
[place] [facility] [place] NAME: Matthew Poole    MR#:  [id number]  DATE OF BIRTH:  05/27/1940  SUBJECTIVE:no sob.sitting in chair.  CHIEF COMPLAINT:   Chief Complaint  Patient presents with  . Shortness of Breath   Admitted with shortness of breath, CHF and acute kidney injury. Was on a BiPAP and now transitioned to nasal cannula. Transferred out of ICU.  No concerns. Patient has cognitive impairment limiting history.  REVIEW OF SYSTEMS:  Unable to give ROS due to baseline mental status. ROS  DRUG ALLERGIES:   Allergies  Allergen Reactions  . Penicillins Itching, Nausea And Vomiting, Rash and Other (See Comments)    Unable to obtain enough information to answer additional questions about this medication.      VITALS:  Blood pressure (!) 106/55, pulse 90, temperature 97.6 F (36.4 C), temperature source Oral, resp. rate 16, height 6' (1.829 m), weight 59.1 kg (130 lb 6.4 oz), SpO2 97 %.  PHYSICAL EXAMINATION:  GENERAL:  75 y.o.-year-old patient lying in the bed with no acute distress.  EYES: Pupils equal, round, reactive to light and accommodation. No scleral icterus. Extraocular muscles intact.  HEENT: Head atraumatic, normocephalic. Oropharynx and nasopharynx clear.  NECK:  Supple, no jugular venous distention. No thyroid enlargement, no tenderness.  LUNGS: Normal breath sounds bilaterally, some wheezing, b/l crepitation. No use of accessory muscles of respiration.  CARDIOVASCULAR: S1, S2 normal. No murmurs, rubs, or gallops.  ABDOMEN: Soft, nontender, nondistended. Bowel sounds present. No organomegaly or mass.  Foley in place with hematuria EXTREMITIES: No pedal edema, cyanosis, or clubbing.  NEUROLOGIC: Cranial nerves II through XII are intact. Muscle strength 4/5 in all extremities. Sensation intact. Gait not checked.  PSYCHIATRIC: The patient is alert and awake. He answers simple questions. SKIN: No obvious rash, lesion, or  ulcer.   Physical Exam LABORATORY PANEL:   CBC  Recent Labs Lab 02/04/16 0515  WBC 15.5*  HGB 9.0*  HCT 27.4*  PLT 285   ------------------------------------------------------------------------------------------------------------------  Chemistries   Recent Labs Lab 01/31/16 1956 02/01/16 0332  02/05/16 0538  NA 138 138  < > 139  K 4.5 4.5  < > 4.8  CL 105 107  < > 99*  CO2 21* 21*  < > 30  GLUCOSE 293* 157*  < > 142*  BUN 70* 71*  < > 70*  CREATININE 4.63* 4.56*  < > 3.51*  CALCIUM 8.0* 8.0*  < > 8.6*  MG  --  2.5*  --   --   AST 24  --   --   --   ALT 11*  --   --   --   ALKPHOS 99  --   --   --   BILITOT 0.5  --   --   --   < > = values in this interval not displayed. ------------------------------------------------------------------------------------------------------------------  Cardiac Enzymes  Recent Labs Lab 02/01/16 0332 02/01/16 1138  TROPONINI 1.30* 1.32*   ------------------------------------------------------------------------------------------------------------------  RADIOLOGY:  Dg Chest 2 View  Result Date: 02/04/2016 CLINICAL DATA:  Difficulty swallowing. EXAM: CHEST  2 VIEW COMPARISON:  None 22,017. FINDINGS: Dual lumen right IJ line noted on the right. Tip at cavoatrial junction Cardiomegaly with diffuse prominent bilateral pulmonary interstitial prominence and small bilateral pleural effusions consistent with congestive heart failure. Low lung volumes. Radiopacities scratched noted over the upper abdomen of uncertain etiology. No acute bony abnormality. IMPRESSION: 1. Dual-lumen right IJ line noted with tip at cavoatrial  junction. 2. Congestive heart failure with prominent bilateral pulmonary interstitial edema and small bilateral pleural effusions. Electronically Signed   By: [place]   On: 02/04/2016 10:59    ASSESSMENT AND PLAN:   Active Problems:   Acute respiratory failure ([place])   Palliative care encounter   DNR (do not  resuscitate)  * Acute hypoxic respiratory failure   Due to CHF exacerbation.    Much improved after one session of HD.     Now on nasal canula oxygen.  Still has SOB and crackles on exam.   * Ac on ch renal failure, hematuria, UTI; patient has already chronic renal failure, chronic kidney disease stage III: Baseline creatinine of 2. 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 Nephrology is following for his dialysis needs, urine out put is still  still not good. Patient's family is aware that he is not a good candidate for long-term dialysis secondary to multiple comorbidities.   Has chronic urological issues    [facility]  * Sepsis- resolved. Patient has MRSA UTI: Isolation, continue vancomycin.   On presentation- lactic acid high, tachycardia, elevated WBcs   * Acute on chronic systolic CHF, acute pulmonary edema in the contest of acute renal failure, requiring the dialysis sessions because of shortness of breath, oliguria, pulmonary edema.  Ef 25 % ac systolic No Lasix at this time due to acute kidney injury  * Acute on chronic anemia with hematuira   Transfused one unit in ICU   * Elevated troponin,NSTEMI?ACS>   With significant hematuria, not a candidate for anticoagulation.   Likely this may be due to stress of CHF and renal failure with infection.  Poor long-term prognosis. Palliative care consult, Appreciate presence of register nursing  During  rounds.    All the records are reviewed and case discussed with Care Management/Social Workerr. Management plans discussed with the patient, family and they are in agreement.  CODE STATUS: DNR  TOTAL TIME TAKING CARE OF THIS PATIENT: 35 minutes.   POSSIBLE D/C IN 3-4 DAYS, DEPENDING ON CLINICAL CONDITION.  Epifanio Lesches M.D on 02/05/2016   Between 7am to 6pm -  Pager - [phone]  After 6pm go to [email or web address] - password [email or web address] [facility]  Office  [phone]  CC: Primary care physician; [name], MD  Note: This dictation was prepared with Dragon dictation along with smaller phrase technology. Any transcriptional errors that result from this process are unintentional.

## 2016-02-05 NOTE — Progress Notes (Signed)
Pharmacy Antibiotic Note  Matthew Poole is a 75 y.o. male admitted on 01/31/2016 with sepsis MRSA UTI.  Pharmacy has been consulted for vancomycin dosing.  Plan: Patient is on dialysis here for ARF, baseline CKD 3 at home, unsure of how long dialysis will continue. Nephrology notes that dialysis would not likely be a good long term option for this patient. Will dose conservatively and based off levels. Vancomycin 1 gm IV x 1 now followed in 24 hours by vancomycin 750 mg IV Q48H, predicted trough 15 mcg/mL. Pharmacy will continue to monitor and adjust as needed to maintain trough 15 to 20 mcg/mL.   Vd 41.4 L, ke 0.0174 hr-1, T1/2 39.8 hr  Height: 6' (182.9 cm) Weight: 130 lb 6.4 oz (59.1 kg) IBW/kg (Calculated) : 77.6  Temp (24hrs), Avg:98.1 F (36.7 C), Min:97.6 F (36.4 C), Max:98.2 F (36.8 C)   Recent Labs Lab 01/31/16 1956 01/31/16 1958 01/31/16 2350 02/01/16 0332 02/01/16 1430 02/02/16 0330 02/03/16 0446 02/04/16 0515 02/05/16 0538  WBC 11.0*  --   --  9.9  --  14.3* 13.7* 15.5*  --   CREATININE 4.63*  --   --  4.56* 4.51* 3.32* 4.08* 3.97* 3.51*  LATICACIDVEN  --  2.7* 2.0*  --   --   --   --   --   --     Estimated Creatinine Clearance: 15.7 mL/min (by C-G formula based on SCr of 3.51 mg/dL (H)).    Allergies  Allergen Reactions  . Penicillins Itching, Nausea And Vomiting, Rash and Other (See Comments)    Unable to obtain enough information to answer additional questions about this medication.      Thank you for allowing pharmacy to be a part of this patient's care.  Laural Benes, Pharm.D., BCPS Clinical Pharmacist 02/05/2016 11:31 AM

## 2016-02-05 NOTE — Progress Notes (Signed)
Subjective:   Denies acute c/o sitting up in chair 1100 cc rernoved with HD yesterday  UOP remains low last 24 hrs- 250 cc denies SOB   Objective:  Vital signs in last 24 hours:  Temp:  [97.6 F (36.4 C)-98.2 F (36.8 C)] 97.6 F (36.4 C) (09/23 0702) Pulse Rate:  [90-111] 90 (09/23 0702) Resp:  [16-25] 16 (09/23 0702) BP: (102-124)/(55-72) 106/55 (09/23 0702) SpO2:  [89 %-100 %] 97 % (09/23 0702) Weight:  [59.1 kg (130 lb 6.4 oz)-62.6 kg (138 lb 0.1 oz)] 59.1 kg (130 lb 6.4 oz) (09/23 0727)  Weight change: 0.593 kg (1 lb 4.9 oz) Filed Weights   02/04/16 0500 02/04/16 1400 02/05/16 0727  Weight: 62 kg (136 lb 11.2 oz) 62.6 kg (138 lb 0.1 oz) 59.1 kg (130 lb 6.4 oz)    Intake/Output:    Intake/Output Summary (Last 24 hours) at 02/05/16 1124 Last data filed at 02/05/16 0727  Gross per 24 hour  Intake              325 ml  Output             1476 ml  Net            -1151 ml     Physical Exam: General: NAD, sitting up    HEENT Anicteric, Bland O2  Neck supple  Pulm/lungs Decreased breath sounds at bases  CVS/Heart irregular  Abdomen:  Soft, NT, J tube in place  Extremities: Trace edema  Neurologic: Alert, follows commands  Skin: Warm dry  Access: Rt IJ temp cath       Basic Metabolic Panel:   Recent Labs Lab 02/01/16 0332 02/01/16 1430 02/02/16 0330 02/03/16 0446 02/04/16 0515 02/05/16 0538  NA 138 137 141 141 142 139  K 4.5 5.1 4.1 4.6 4.9 4.8  CL 107 104 103 103 102 99*  CO2 21* 21* 25 29 27 30   GLUCOSE 157* 197* 134* 128* 154* 142*  BUN 71* 81* 57* 76* 93* 70*  CREATININE 4.56* 4.51* 3.32* 4.08* 3.97* 3.51*  CALCIUM 8.0* 8.1* 8.1* 8.4* 8.5* 8.6*  MG 2.5*  --   --   --   --   --   PHOS 5.3*  --   --   --   --   --      CBC:  Recent Labs Lab 01/31/16 1956 02/01/16 0332 02/02/16 0330 02/03/16 0446 02/04/16 0515  WBC 11.0* 9.9 14.3* 13.7* 15.5*  NEUTROABS 9.1*  --   --   --   --   HGB 7.7* 7.4* 8.2* 8.6* 9.0*  HCT 24.2* 22.1* 25.1*  25.8* 27.4*  MCV 89.2 88.1 87.1 88.5 89.3  PLT 263 251 266 265 285      Microbiology:  Recent Results (from the past 720 hour(s))  Blood culture (routine x 2)     Status: None   Collection Time: 01/31/16  8:00 PM  Result Value Ref Range Status   Specimen Description BLOOD  LEFT HAND  Final   Special Requests   Final    BOTTLES DRAWN AEROBIC AND ANAEROBIC  AER 3CC ANA 2CC   Culture NO GROWTH 5 DAYS  Final   Report Status 02/05/2016 FINAL  Final  Blood culture (routine x 2)     Status: None   Collection Time: 01/31/16  8:03 PM  Result Value Ref Range Status   Specimen Description BLOOD  R WRIST  Final   Special Requests BOTTLES DRAWN AEROBIC AND ANAEROBIC  AER4CC ANA3CC  Final   Culture NO GROWTH 5 DAYS  Final   Report Status 02/05/2016 FINAL  Final  Urine culture     Status: Abnormal   Collection Time: 01/31/16  9:36 PM  Result Value Ref Range Status   Specimen Description URINE, CLEAN CATCH  Final   Special Requests NONE  Final   Culture (A)  Final    >=100,000 COLONIES/mL METHICILLIN RESISTANT STAPHYLOCOCCUS AUREUS 50,000 COLONIES/mL ESCHERICHIA COLI    Report Status 02/04/2016 FINAL  Final   Organism ID, Bacteria METHICILLIN RESISTANT STAPHYLOCOCCUS AUREUS (A)  Final   Organism ID, Bacteria ESCHERICHIA COLI (A)  Final      Susceptibility   Escherichia coli - MIC*    AMPICILLIN 4 SENSITIVE Sensitive     CEFAZOLIN <=4 SENSITIVE Sensitive     CEFTRIAXONE <=1 SENSITIVE Sensitive     CIPROFLOXACIN >=4 RESISTANT Resistant     GENTAMICIN <=1 SENSITIVE Sensitive     IMIPENEM <=0.25 SENSITIVE Sensitive     NITROFURANTOIN <=16 SENSITIVE Sensitive     TRIMETH/SULFA <=20 SENSITIVE Sensitive     AMPICILLIN/SULBACTAM <=2 SENSITIVE Sensitive     PIP/TAZO <=4 SENSITIVE Sensitive     Extended ESBL NEGATIVE Sensitive     * 50,000 COLONIES/mL ESCHERICHIA COLI   Methicillin resistant staphylococcus aureus - MIC*    CIPROFLOXACIN >=8 RESISTANT Resistant     GENTAMICIN <=0.5  SENSITIVE Sensitive     NITROFURANTOIN <=16 SENSITIVE Sensitive     OXACILLIN >=4 RESISTANT Resistant     TETRACYCLINE <=1 SENSITIVE Sensitive     VANCOMYCIN 1 SENSITIVE Sensitive     TRIMETH/SULFA <=10 SENSITIVE Sensitive     CLINDAMYCIN <=0.25 SENSITIVE Sensitive     RIFAMPIN <=0.5 SENSITIVE Sensitive     Inducible Clindamycin NEGATIVE Sensitive     * >=100,000 COLONIES/mL METHICILLIN RESISTANT STAPHYLOCOCCUS AUREUS  MRSA PCR Screening     Status: Abnormal   Collection Time: 02/01/16 12:52 AM  Result Value Ref Range Status   MRSA by PCR POSITIVE (A) NEGATIVE Final    Comment:        The GeneXpert MRSA Assay (FDA approved for NASAL specimens only), is one component of a comprehensive MRSA colonization surveillance program. It is not intended to diagnose MRSA infection nor to guide or monitor treatment for MRSA infections. RESULT CALLED TO, READ BACK BY AND VERIFIED WITH: ALISA RAWLINS @ [id number] ON 02/01/2016 BY CAF   CULTURE, BLOOD (ROUTINE X 2) w Reflex to ID Panel     Status: None (Preliminary result)   Collection Time: 02/04/16  1:10 PM  Result Value Ref Range Status   Specimen Description BLOOD  Final   Special Requests NONE  Final   Culture NO GROWTH < 24 HOURS  Final   Report Status PENDING  Incomplete  CULTURE, BLOOD (ROUTINE X 2) w Reflex to ID Panel     Status: None (Preliminary result)   Collection Time: 02/04/16  1:10 PM  Result Value Ref Range Status   Specimen Description BLOOD  Final   Special Requests NONE  Final   Culture NO GROWTH < 24 HOURS  Final   Report Status PENDING  Incomplete    Coagulation Studies: No results for input(s): LABPROT, INR in the last 72 hours.  Urinalysis: No results for input(s): COLORURINE, LABSPEC, PHURINE, GLUCOSEU, HGBUR, BILIRUBINUR, KETONESUR, PROTEINUR, UROBILINOGEN, NITRITE, LEUKOCYTESUR in the last 72 hours.  Invalid input(s): APPERANCEUR    Imaging: Dg Chest 2 View  Result Date: 02/04/2016 CLINICAL DATA:  Difficulty swallowing. EXAM: CHEST  2 VIEW COMPARISON:  None 22,017. FINDINGS: Dual lumen right IJ line noted on the right. Tip at cavoatrial junction Cardiomegaly with diffuse prominent bilateral pulmonary interstitial prominence and small bilateral pleural effusions consistent with congestive heart failure. Low lung volumes. Radiopacities scratched noted over the upper abdomen of uncertain etiology. No acute bony abnormality. IMPRESSION: 1. Dual-lumen right IJ line noted with tip at cavoatrial junction. 2. Congestive heart failure with prominent bilateral pulmonary interstitial edema and small bilateral pleural effusions. Electronically Signed   By: [place]   On: 02/04/2016 10:59     Medications:     . chlorhexidine  15 mL Mouth Rinse BID  . clindamycin (CLEOCIN) IV  600 mg Intravenous Q8H  . famotidine (PEPCID) IV  20 mg Intravenous Q24H  . feeding supplement (JEVITY 1.5 CAL/FIBER)  237-474 mL Per Tube QID  . free water  200 mL Per Tube Q6H  . ipratropium-albuterol  3 mL Nebulization Q6H  . mouth rinse  15 mL Mouth Rinse q12n4p  . predniSONE  30 mg Oral Q breakfast  . sertraline  50 mg Oral Daily   sodium chloride, ipratropium-albuterol  Assessment/ Plan:  75 y.o. 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 Elyse Hsu on 02/07/2016.   1. ARF, oliguric 2. CKD st 3, Baseline Cr 2.0 From 11/23/15. GFR 33.  3. Solitary left Kidney 4. Low grade Ta bladder cancer with left renal pelvic mass s/p multiple ablations 5. Acute pulmonary edema 6. Anemia, requiring blood transfusion 7. NSTEMI/ ACS 8. MRSA, E Coli UTI  ARF is likely multifactorial, ATN. Renal U.S is neg for obstruction. Bactrim use from week prior to admission could have contributed to ARF  UOP remains  low dialysis done yesterday. 1100 cc removed Evaluate daily for need of HD  Previously discussed with POA that patient would not make a good candidate for long term dialysis due to his multiple co-morbidities     LOS: 5 Shayden Bobier 9/23/201711:24 AM

## 2016-02-05 NOTE — Progress Notes (Signed)
Pharmacy Antibiotic Note  Matthew Poole is a 75 y.o. male admitted on 01/31/2016 with sepsis MRSA UTI.  Pharmacy has been consulted for vancomycin dosing.  Plan: Pt on vancomycin and clindamycin at this time both sensitive to MRSA growing in UCx. Spoke with MD, would like to d/c vanc at this time due to AKI and keep clindamycin. Per rounding hospitalist, also states no need to treat E coli in UCx as <100k.  Height: 6' (182.9 cm) Weight: 130 lb 6.4 oz (59.1 kg) IBW/kg (Calculated) : 77.6  Temp (24hrs), Avg:98 F (36.7 C), Min:97.6 F (36.4 C), Max:98.2 F (36.8 C)   Recent Labs Lab 01/31/16 1956 01/31/16 1958 01/31/16 2350 02/01/16 0332 02/01/16 1430 02/02/16 0330 02/03/16 0446 02/04/16 0515 02/05/16 0538  WBC 11.0*  --   --  9.9  --  14.3* 13.7* 15.5*  --   CREATININE 4.63*  --   --  4.56* 4.51* 3.32* 4.08* 3.97* 3.51*  LATICACIDVEN  --  2.7* 2.0*  --   --   --   --   --   --     Estimated Creatinine Clearance: 15.7 mL/min (by C-G formula based on SCr of 3.51 mg/dL (H)).    Allergies  Allergen Reactions  . Penicillins Itching, Nausea And Vomiting, Rash and Other (See Comments)    Unable to obtain enough information to answer additional questions about this medication.      Thank you for allowing pharmacy to be a part of this patient's care.  Rocky Morel, Pharm.D., BCPS Clinical Pharmacist 02/05/2016 3:36 PM

## 2016-02-06 LAB — BASIC METABOLIC PANEL
Anion gap: 11 (ref 5–15)
BUN: 91 mg/dL — AB (ref 6–20)
CHLORIDE: 97 mmol/L — AB (ref 101–111)
CO2: 29 mmol/L (ref 22–32)
Calcium: 8.4 mg/dL — ABNORMAL LOW (ref 8.9–10.3)
Creatinine, Ser: 3.92 mg/dL — ABNORMAL HIGH (ref 0.61–1.24)
GFR calc non Af Amer: 14 mL/min — ABNORMAL LOW (ref >60)
GFR, EST AFRICAN AMERICAN: 16 mL/min — AB (ref >60)
Glucose, Bld: 152 mg/dL — ABNORMAL HIGH (ref 65–99)
POTASSIUM: 5.1 mmol/L (ref 3.5–5.1)
SODIUM: 137 mmol/L (ref 135–145)

## 2016-02-06 LAB — CBC
HEMATOCRIT: 26.8 % — AB (ref 40.0–52.0)
HEMOGLOBIN: 8.6 g/dL — AB (ref 13.0–18.0)
MCH: 28.7 pg (ref 26.0–34.0)
MCHC: 32.1 g/dL (ref 32.0–36.0)
MCV: 89.4 fL (ref 80.0–100.0)
Platelets: 302 10*3/uL (ref 150–440)
RBC: 3 MIL/uL — AB (ref 4.40–5.90)
RDW: 15.5 % — ABNORMAL HIGH (ref 11.5–14.5)
WBC: 13.2 10*3/uL — AB (ref 3.8–10.6)

## 2016-02-06 NOTE — Clinical Social Work Note (Signed)
CSW attempted to contact patient's guardian, [name] on both 9/23 and 9/24. Left voicemail at both attempts. CSW will continue to follow.  Santiago Bumpers, MSW, LCSW-A [phone]

## 2016-02-06 NOTE — Progress Notes (Signed)
Subjective:   Denies acute c/o sitting up in chair 1100 cc rernoved with HD Friday UOP remains low last 24 hrs- 280 cc denies SOB   Objective:  Vital signs in last 24 hours:  Temp:  [97.5 F (36.4 C)-97.9 F (36.6 C)] 97.6 F (36.4 C) (09/24 1226) Pulse Rate:  [88-106] 94 (09/24 1226) Resp:  [18-20] 18 (09/24 1226) BP: (110-114)/(62-71) 114/70 (09/24 1226) SpO2:  [96 %-99 %] 96 % (09/24 1226) Weight:  [62 kg (136 lb 11.2 oz)] 62 kg (136 lb 11.2 oz) (09/24 0414)  Weight change: -3.451 kg (-7 lb 9.7 oz) Filed Weights   02/04/16 1400 02/05/16 0727 02/06/16 0414  Weight: 62.6 kg (138 lb 0.1 oz) 59.1 kg (130 lb 6.4 oz) 62 kg (136 lb 11.2 oz)    Intake/Output:    Intake/Output Summary (Last 24 hours) at 02/06/16 1235 Last data filed at 02/06/16 1021  Gross per 24 hour  Intake             1808 ml  Output              240 ml  Net             1568 ml     Physical Exam: General: NAD, sitting up    HEENT Anicteric,    Neck supple  Pulm/lungs Decreased breath sounds at bases  CVS/Heart irregular  Abdomen:  Soft, NT, J tube in place  Extremities: Trace edema  Neurologic: Alert, follows commands  Skin: Warm dry  Access: Rt IJ temp cath   Foley in place - dark urine    Basic Metabolic Panel:   Recent Labs Lab 02/01/16 0332  02/02/16 0330 02/03/16 0446 02/04/16 0515 02/05/16 0538 02/06/16 0528  NA 138  < > 141 141 142 139 137  K 4.5  < > 4.1 4.6 4.9 4.8 5.1  CL 107  < > 103 103 102 99* 97*  CO2 21*  < > 25 29 27 30 29   GLUCOSE 157*  < > 134* 128* 154* 142* 152*  BUN 71*  < > 57* 76* 93* 70* 91*  CREATININE 4.56*  < > 3.32* 4.08* 3.97* 3.51* 3.92*  CALCIUM 8.0*  < > 8.1* 8.4* 8.5* 8.6* 8.4*  MG 2.5*  --   --   --   --   --   --   PHOS 5.3*  --   --   --   --   --   --   < > = values in this interval not displayed.   CBC:  Recent Labs Lab 01/31/16 1956 02/01/16 0332 02/02/16 0330 02/03/16 0446 02/04/16 0515 02/06/16 0528  WBC 11.0* 9.9 14.3*  13.7* 15.5* 13.2*  NEUTROABS 9.1*  --   --   --   --   --   HGB 7.7* 7.4* 8.2* 8.6* 9.0* 8.6*  HCT 24.2* 22.1* 25.1* 25.8* 27.4* 26.8*  MCV 89.2 88.1 87.1 88.5 89.3 89.4  PLT 263 251 266 265 285 302      Microbiology:  Recent Results (from the past 720 hour(s))  Blood culture (routine x 2)     Status: None   Collection Time: 01/31/16  8:00 PM  Result Value Ref Range Status   Specimen Description BLOOD  LEFT HAND  Final   Special Requests   Final    BOTTLES DRAWN AEROBIC AND ANAEROBIC  AER 3CC ANA 2CC   Culture NO GROWTH 5 DAYS  Final   Report Status  02/05/2016 FINAL  Final  Blood culture (routine x 2)     Status: None   Collection Time: 01/31/16  8:03 PM  Result Value Ref Range Status   Specimen Description BLOOD  R WRIST  Final   Special Requests BOTTLES DRAWN AEROBIC AND ANAEROBIC  AER4CC ANA3CC  Final   Culture NO GROWTH 5 DAYS  Final   Report Status 02/05/2016 FINAL  Final  Urine culture     Status: Abnormal   Collection Time: 01/31/16  9:36 PM  Result Value Ref Range Status   Specimen Description URINE, CLEAN CATCH  Final   Special Requests NONE  Final   Culture (A)  Final    >=100,000 COLONIES/mL METHICILLIN RESISTANT STAPHYLOCOCCUS AUREUS 50,000 COLONIES/mL ESCHERICHIA COLI    Report Status 02/04/2016 FINAL  Final   Organism ID, Bacteria METHICILLIN RESISTANT STAPHYLOCOCCUS AUREUS (A)  Final   Organism ID, Bacteria ESCHERICHIA COLI (A)  Final      Susceptibility   Escherichia coli - MIC*    AMPICILLIN 4 SENSITIVE Sensitive     CEFAZOLIN <=4 SENSITIVE Sensitive     CEFTRIAXONE <=1 SENSITIVE Sensitive     CIPROFLOXACIN >=4 RESISTANT Resistant     GENTAMICIN <=1 SENSITIVE Sensitive     IMIPENEM <=0.25 SENSITIVE Sensitive     NITROFURANTOIN <=16 SENSITIVE Sensitive     TRIMETH/SULFA <=20 SENSITIVE Sensitive     AMPICILLIN/SULBACTAM <=2 SENSITIVE Sensitive     PIP/TAZO <=4 SENSITIVE Sensitive     Extended ESBL NEGATIVE Sensitive     * 50,000 COLONIES/mL  ESCHERICHIA COLI   Methicillin resistant staphylococcus aureus - MIC*    CIPROFLOXACIN >=8 RESISTANT Resistant     GENTAMICIN <=0.5 SENSITIVE Sensitive     NITROFURANTOIN <=16 SENSITIVE Sensitive     OXACILLIN >=4 RESISTANT Resistant     TETRACYCLINE <=1 SENSITIVE Sensitive     VANCOMYCIN 1 SENSITIVE Sensitive     TRIMETH/SULFA <=10 SENSITIVE Sensitive     CLINDAMYCIN <=0.25 SENSITIVE Sensitive     RIFAMPIN <=0.5 SENSITIVE Sensitive     Inducible Clindamycin NEGATIVE Sensitive     * >=100,000 COLONIES/mL METHICILLIN RESISTANT STAPHYLOCOCCUS AUREUS  MRSA PCR Screening     Status: Abnormal   Collection Time: 02/01/16 12:52 AM  Result Value Ref Range Status   MRSA by PCR POSITIVE (A) NEGATIVE Final    Comment:        The GeneXpert MRSA Assay (FDA approved for NASAL specimens only), is one component of a comprehensive MRSA colonization surveillance program. It is not intended to diagnose MRSA infection nor to guide or monitor treatment for MRSA infections. RESULT CALLED TO, READ BACK BY AND VERIFIED WITH: ALISA RAWLINS @ [id number] ON 02/01/2016 BY CAF   CULTURE, BLOOD (ROUTINE X 2) w Reflex to ID Panel     Status: None (Preliminary result)   Collection Time: 02/04/16  1:10 PM  Result Value Ref Range Status   Specimen Description BLOOD  Final   Special Requests NONE  Final   Culture NO GROWTH 2 DAYS  Final   Report Status PENDING  Incomplete  CULTURE, BLOOD (ROUTINE X 2) w Reflex to ID Panel     Status: None (Preliminary result)   Collection Time: 02/04/16  1:10 PM  Result Value Ref Range Status   Specimen Description BLOOD  Final   Special Requests NONE  Final   Culture NO GROWTH 2 DAYS  Final   Report Status PENDING  Incomplete    Coagulation Studies: No results for input(s):  LABPROT, INR in the last 72 hours.  Urinalysis: No results for input(s): COLORURINE, LABSPEC, PHURINE, GLUCOSEU, HGBUR, BILIRUBINUR, KETONESUR, PROTEINUR, UROBILINOGEN, NITRITE, LEUKOCYTESUR in the last  72 hours.  Invalid input(s): APPERANCEUR    Imaging: No results found.   Medications:     . chlorhexidine  15 mL Mouth Rinse BID  . clindamycin (CLEOCIN) IV  600 mg Intravenous Q8H  . famotidine (PEPCID) IV  20 mg Intravenous Q24H  . feeding supplement (JEVITY 1.5 CAL/FIBER)  237-474 mL Per Tube QID  . free water  200 mL Per Tube Q6H  . ipratropium-albuterol  3 mL Nebulization TID  . mouth rinse  15 mL Mouth Rinse q12n4p  . predniSONE  30 mg Oral Q breakfast  . sertraline  50 mg Oral Daily   sodium chloride, ipratropium-albuterol  Assessment/ Plan:  75 y.o. 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 Elyse Hsu on 02/07/2016.   1. ARF, oliguric 2. CKD st 3, Baseline Cr 2.0 From 11/23/15. GFR 33.  3. Solitary left Kidney 4. Low grade Ta bladder cancer with left renal pelvic mass s/p multiple ablations 5. Acute pulmonary edema 6. Anemia, requiring blood transfusion 7. NSTEMI/ ACS 8. MRSA, E Coli UTI  ARF is likely multifactorial, ATN. Renal U.S is neg for obstruction. Bactrim use from week prior to admission could have contributed to ARF  UOP remains low dialysis done friday. 1100 cc removed Evaluate daily for need of HD  Previously discussed with POA that patient would not make a good candidate for long term dialysis due to his multiple co-morbidities BUN and Cr are worse. May need to consider hospice as UOP/Renal function is not improving as expected     LOS: 6 Matthew Poole 9/24/201712:35 PM

## 2016-02-06 NOTE — Progress Notes (Signed)
[facility] [place] NAME: Matthew Poole    MR#:  [id number]  DATE OF BIRTH:  03-Feb-1941  SUBJECTIVE: Patient denies any complaints.   CHIEF COMPLAINT:   Chief Complaint  Patient presents with  . Shortness of Breath   Admitted with shortness of breath, CHF and acute kidney injury. Was on a BiPAP and now transitioned to nasal cannula. Transferred out of ICU.  No concerns. Patient has cognitive impairment limiting history.  REVIEW OF SYSTEMS:  Unable to give ROS due to baseline mental status. ROS  DRUG ALLERGIES:   Allergies  Allergen Reactions  . Penicillins Itching, Nausea And Vomiting, Rash and Other (See Comments)    Unable to obtain enough information to answer additional questions about this medication.      VITALS:  Blood pressure 112/71, pulse 99, temperature 97.6 F (36.4 C), temperature source Oral, resp. rate 18, height 6' (1.829 m), weight 62 kg (136 lb 11.2 oz), SpO2 96 %.  PHYSICAL EXAMINATION:  GENERAL:  75 y.o.-year-old patient lying in the bed with no acute distress.  EYES: Pupils equal, round, reactive to light and accommodation. No scleral icterus. Extraocular muscles intact.  HEENT: Head atraumatic, normocephalic. Oropharynx and nasopharynx clear.  NECK:  Supple, no jugular venous distention. No thyroid enlargement, no tenderness.  LUNGS: Normal breath sounds bilaterally, some wheezing, b/l crepitation. No use of accessory muscles of respiration.  CARDIOVASCULAR: S1, S2 normal. No murmurs, rubs, or gallops.  ABDOMEN: Soft, nontender, nondistended. Bowel sounds present. No organomegaly or mass.  Foley in place with hematuria EXTREMITIES: No pedal edema, cyanosis, or clubbing.  NEUROLOGIC: Cranial nerves II through XII are intact. Muscle strength 4/5 in all extremities. Sensation intact. Gait not checked.  PSYCHIATRIC: The patient is alert and awake. He answers simple questions. SKIN: No obvious rash, lesion, or  ulcer.   Physical Exam LABORATORY PANEL:   CBC  Recent Labs Lab 02/06/16 0528  WBC 13.2*  HGB 8.6*  HCT 26.8*  PLT 302   ------------------------------------------------------------------------------------------------------------------  Chemistries   Recent Labs Lab 01/31/16 1956 02/01/16 0332  02/06/16 0528  NA 138 138  < > 137  K 4.5 4.5  < > 5.1  CL 105 107  < > 97*  CO2 21* 21*  < > 29  GLUCOSE 293* 157*  < > 152*  BUN 70* 71*  < > 91*  CREATININE 4.63* 4.56*  < > 3.92*  CALCIUM 8.0* 8.0*  < > 8.4*  MG  --  2.5*  --   --   AST 24  --   --   --   ALT 11*  --   --   --   ALKPHOS 99  --   --   --   BILITOT 0.5  --   --   --   < > = values in this interval not displayed. ------------------------------------------------------------------------------------------------------------------  Cardiac Enzymes  Recent Labs Lab 02/01/16 0332 02/01/16 1138  TROPONINI 1.30* 1.32*   ------------------------------------------------------------------------------------------------------------------  RADIOLOGY:  No results found.  ASSESSMENT AND PLAN:   Active Problems:   Acute respiratory failure ([place])   Palliative care encounter   DNR (do not resuscitate)  * Acute hypoxic respiratory failure   Due to CHF exacerbation.    Much improved after one session of HD.    saturation 97% on room air today. Nephrology to decide on dialysis as needed.  ;  * Ac on ch renal failure, hematuria, UTI; patient has already chronic  renal failure, chronic kidney disease stage III: Baseline creatinine of 2. 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 Nephrology is following for his dialysis needs, urine out put is still   not good. Patient's family is aware that he is not a good candidate for long-term dialysis secondary to multiple comorbidities.   Has chronic urological issues    [facility]  [facility]  * Sepsis- resolved. Patient has MRSA UTI: Isolation, on clindamycin.   On presentation- lactic acid high, tachycardia, elevated WBcs; improving now.   * Acute on chronic systolic CHF, acute pulmonary edema in the contest of acute renal failure, requiring the dialysis sessions because of shortness of breath, oliguria, pulmonary edema.  Ef 25 % ac systolic No Lasix at this time due to acute kidney injury  * Acute on chronic anemia with hematuira   Transfused one unit in ICU   * Elevated troponin,NSTEMI?ACS>   With significant hematuria, not a candidate for anticoagulation.   Likely this may be due to stress of CHF and renal failure with infection.  Poor long-term prognosis. Palliative care consulted. Guardian is a willing  to accept hospice if patient continues to deteriorate despite aggressive treatment   Appreciate presence of register nursing  During  rounds.    All the records are reviewed and case discussed with Care Management/Social Workerr. Management plans discussed with the patient, family and they are in agreement.  CODE STATUS: DNR  TOTAL TIME TAKING CARE OF THIS PATIENT: 35 minutes.   POSSIBLE D/C IN 3-4 DAYS, DEPENDING ON CLINICAL CONDITION.  Epifanio Lesches M.D on 02/06/2016   Between 7am to 6pm - Pager - [phone]  After 6pm go to [email or web address] - password [email or web address] [facility]  Office  [phone]  CC: Primary care physician; [name], MD  Note: This dictation was prepared with Dragon dictation along with smaller phrase technology. Any transcriptional errors that result from this process are unintentional.

## 2016-02-07 DIAGNOSIS — Z515 Encounter for palliative care: Secondary | ICD-10-CM

## 2016-02-07 DIAGNOSIS — R0600 Dyspnea, unspecified: Secondary | ICD-10-CM

## 2016-02-07 LAB — CBC
HCT: 24.9 % — ABNORMAL LOW (ref 40.0–52.0)
HEMOGLOBIN: 8 g/dL — AB (ref 13.0–18.0)
MCH: 28.2 pg (ref 26.0–34.0)
MCHC: 32 g/dL (ref 32.0–36.0)
MCV: 88.2 fL (ref 80.0–100.0)
Platelets: 272 10*3/uL (ref 150–440)
RBC: 2.82 MIL/uL — ABNORMAL LOW (ref 4.40–5.90)
RDW: 15.1 % — ABNORMAL HIGH (ref 11.5–14.5)
WBC: 12.8 10*3/uL — ABNORMAL HIGH (ref 3.8–10.6)

## 2016-02-07 LAB — BASIC METABOLIC PANEL
Anion gap: 10 (ref 5–15)
BUN: 103 mg/dL — AB (ref 6–20)
CHLORIDE: 95 mmol/L — AB (ref 101–111)
CO2: 29 mmol/L (ref 22–32)
CREATININE: 4.58 mg/dL — AB (ref 0.61–1.24)
Calcium: 8.2 mg/dL — ABNORMAL LOW (ref 8.9–10.3)
GFR calc Af Amer: 13 mL/min — ABNORMAL LOW (ref >60)
GFR calc non Af Amer: 12 mL/min — ABNORMAL LOW (ref >60)
Glucose, Bld: 138 mg/dL — ABNORMAL HIGH (ref 65–99)
Potassium: 5 mmol/L (ref 3.5–5.1)
SODIUM: 134 mmol/L — AB (ref 135–145)

## 2016-02-07 LAB — PHOSPHORUS: PHOSPHORUS: 7.4 mg/dL — AB (ref 2.5–4.6)

## 2016-02-07 MED ORDER — LORAZEPAM 1 MG PO TABS: 1.0000 mg | ORAL_TABLET | ORAL | Status: DC | PRN

## 2016-02-07 MED ORDER — MORPHINE SULFATE (CONCENTRATE) 10 MG/0.5ML PO SOLN: 10.0000 mg | ORAL | Status: DC | PRN

## 2016-02-07 MED ORDER — MORPHINE SULFATE (CONCENTRATE) 10 MG/0.5ML PO SOLN
5.0000 mg | ORAL | Status: DC | PRN
  Filled 2016-02-07: qty 1

## 2016-02-07 MED ORDER — MORPHINE SULFATE (CONCENTRATE) 10 MG/0.5ML PO SOLN
5.0000 mg | Freq: Four times a day (QID) | ORAL | Status: DC
  Administered 2016-02-07 – 2016-02-08 (×4): 5 mg via ORAL
  Filled 2016-02-07 (×3): qty 1

## 2016-02-07 NOTE — Progress Notes (Signed)
Post dialysis 

## 2016-02-07 NOTE — Care Management Important Message (Signed)
Important Message  Patient Details  Name: Matthew Poole MRN: [id number] Date of Birth: 03-Nov-1940   Medicare Important Message Given:  Yes    Beverly Sessions, RN 02/07/2016, 2:23 PM

## 2016-02-07 NOTE — Progress Notes (Signed)
Daily Progress Note   Patient Name: Matthew Poole       Date: 02/07/2016 DOB: 05-30-1940  Age: 75 y.o. MRN#: [id number] Attending Physician: Matthew Loll, MD Primary Care Physician: Matthew Harrier, MD Admit Date: 01/31/2016  Reason for Consultation/Follow-up: Establishing goals of care, Non pain symptom management, Pain control and Psychosocial/spiritual support  Subjective:  This NP Matthew Poole reviewed medical records, received report from team, assessed the patient and then spoke to the patient's guardian Matthew Poole  to discuss diagnosis, prognosis, [place], EOL wishes disposition and options.  A continued discussion was had today regarding advanced directives.  Concepts specific to code status, artifical feeding and hydration, continued IV antibiotics, dialysis and rehospitalization was had.  The difference between a aggressive medical intervention path  and a palliative comfort care path for this patient at this time was had.  Values and goals of care important to patient and family were attempted to be elicited.   Concept of Hospice was discussed  Natural trajectory and expectations at EOL were discussed.  Questions and concerns addressed.   PMT will continue to support holistically.  Decision to shift to a full comfort path made.   Hopeful for hospice facility.   No further dialysis, diagnostics or  artifical feeding/hydration, allow comfort feeds as tolerated with known risk for aspiration.   Length of Stay: 7  Current Medications: Scheduled Meds:  . chlorhexidine  15 mL Mouth Rinse BID  . clindamycin (CLEOCIN) IV  600 mg Intravenous Q8H  . famotidine (PEPCID) IV  20 mg Intravenous Q24H  . feeding supplement (JEVITY 1.5 CAL/FIBER)  237-474 mL Per Tube QID  . free water   200 mL Per Tube Q6H  . ipratropium-albuterol  3 mL Nebulization TID  . mouth rinse  15 mL Mouth Rinse q12n4p  . predniSONE  30 mg Oral Q breakfast  . sertraline  50 mg Oral Daily    Continuous Infusions:    PRN Meds: sodium chloride, ipratropium-albuterol  Physical Exam  Constitutional: He appears lethargic. He appears cachectic. He appears ill.  HENT:  Mouth/Throat: Mucous membranes are dry.  Cardiovascular: Tachycardia present.   Pulmonary/Chest: He has decreased breath sounds in the right lower field and the left lower field.  Musculoskeletal:  -generalized weakness and atrophy  Neurological: He appears lethargic.  Skin: Skin is  warm and dry.            Vital Signs: BP 111/66 (BP Location: Right Arm)   Pulse (!) 103   Temp 97.2 F (36.2 C) (Oral)   Resp (!) 21   Ht 6' (1.829 m)   Wt 63.1 kg (139 lb 1.8 oz)   SpO2 92%   BMI 18.87 kg/m  SpO2: SpO2: 92 % O2 Device: O2 Device: Nasal Cannula O2 Flow Rate: O2 Flow Rate (L/min): 2 L/min  Intake/output summary:  Intake/Output Summary (Last 24 hours) at 02/07/16 1250 Last data filed at 02/07/16 0700  Gross per 24 hour  Intake             1361 ml  Output              225 ml  Net             1136 ml   LBM: Last BM Date: 02/06/16 Baseline Weight: Weight: 72.6 kg (160 lb) Most recent weight: Weight: 63.1 kg (139 lb 1.8 oz)       Palliative Assessment/Data: 30 % at best    Flowsheet Rows   Flowsheet Row Most Recent Value  Intake Tab  Referral Department  Hospitalist  Unit at Time of Referral  ICU  Palliative Care Primary Diagnosis  Cardiac  Date Notified  02/02/16  Palliative Care Type  New Palliative care  Reason for referral  Clarify Goals of Care  Date of Admission  01/31/16  Date first seen by Palliative Care  02/02/16  # of days Palliative referral response time  0 Day(s)  # of days IP prior to Palliative referral  2  Clinical Assessment  Psychosocial & Spiritual Assessment  Palliative Care Outcomes       Patient Active Problem List   Diagnosis Date Noted  . Palliative care encounter   . DNR (do not resuscitate)   . Acute respiratory failure ([place]) 01/31/2016  . Malnutrition of moderate degree 04/06/2015  . UTI (lower urinary tract infection) 04/05/2015  . Acute renal failure ([place]) 04/05/2015  . Sepsis secondary to UTI ([place]) 12/25/2014  . Sepsis ([place]) 12/25/2014    Palliative Care Assessment & Plan     Assessment: 75 y.o. male  with past medical history of renal cell carcinoma s/pmultiple ablations and right nephrectomy, chronic indwelling catheter, psoriasis, COPD, CHF EF 25-50%., CKD stage III, GERD, mental retardation and dysphagia, s/p PEG admitted on 01/31/2016 with shortness of breath and acute renal failure requiring BiPAP at admission, dialysis tx initiated during this admission.  Overall failure to thrive, rapid  Decline.    Decision made to focus on comfort     Goals of Care and Additional Recommendations:  Limitations on Scope of Treatment: Full Comfort Care and No Hemodialysis  Code Status:    Code Status Orders        Start     Ordered   02/01/16 0927  Do not attempt resuscitation (DNR)  Continuous    Question Answer Comment  In the event of cardiac or respiratory ARREST Do not call a "code blue"   In the event of cardiac or respiratory ARREST Do not perform Intubation, CPR, defibrillation or ACLS   In the event of cardiac or respiratory ARREST Use medication by any route, position, wound care, and other measures to relive pain and suffering. May use oxygen, suction and manual treatment of airway obstruction as needed for comfort.      02/01/16 0926    Code  Status History    Date Active Date Inactive Code Status Order ID Comments User Context   01/31/2016 10:31 PM 02/01/2016  8:56 AM Full Code [id number]  Matthew Raring, NP ED   04/05/2015  5:07 PM 04/07/2015  6:17 PM Full Code [id number]  Matthew Loll, MD Inpatient   12/25/2014  2:54 PM 12/29/2014  5:42 PM  Full Code [id number]  Matthew Basta, MD Inpatient       Prognosis:   < 2 weeks  Discharge Planning:  Hospice facility  Care plan was discussed with Dr Matthew Poole  Thank you for allowing the Palliative Medicine Team to assist in the care of this patient.   Time In: 1230 Time Out: 1305 Total Time 35 min Prolonged Time Billed  no       Greater than 50%  of this time was spent counseling and coordinating care related to the above assessment and plan.  Matthew Lessen, NP  Please contact Palliative Medicine Team phone at [phone] for questions and concerns.

## 2016-02-07 NOTE — Clinical Social Work Note (Signed)
Palliative has spoken to patient's guardian: Geryl Rankins and he has decided to make patient comfort measures. CSW contacted [name] and he has chosen [facility]. Referral to Santiago Glad with Hospice has been made. Shela Leff MSW,LCSW [phone]

## 2016-02-07 NOTE — Progress Notes (Signed)
Nutrition Brief Note  Chart reviewed. Pt now transitioning to comfort care. Tube Feeding discontinued, pt NPO. No further nutrition interventions warranted at this time. RD will sign off.  Please re-consult as needed.   Kerman Passey [place], [place], LDN [phone] Pager  [phone] Weekend/On-Call Pager

## 2016-02-07 NOTE — Progress Notes (Signed)
Pre Dialysis 

## 2016-02-07 NOTE — Progress Notes (Signed)
Dialysis complete

## 2016-02-07 NOTE — Progress Notes (Signed)
Subjective:  Renal function worse over the weekend. BUN up to 103 with a creatinine of 4.5. Therefore patient underwent hemodialysis today.  Objective:  Vital signs in last 24 hours:  Temp:  [97.2 F (36.2 C)-97.8 F (36.6 C)] 97.4 F (36.3 C) (09/25 1345) Pulse Rate:  [98-115] 103 (09/25 1359) Resp:  [16-25] 19 (09/25 1359) BP: (104-121)/(61-78) 121/78 (09/25 1359) SpO2:  [84 %-96 %] 95 % (09/25 1359) Weight:  [62 kg (136 lb 11 oz)-63.6 kg (140 lb 4.8 oz)] 62 kg (136 lb 11 oz) (09/25 1359)  Weight change: 4.491 kg (9 lb 14.4 oz) Filed Weights   02/07/16 0400 02/07/16 1000 02/07/16 1359  Weight: 63.6 kg (140 lb 4.8 oz) 63.1 kg (139 lb 1.8 oz) 62 kg (136 lb 11 oz)    Intake/Output:    Intake/Output Summary (Last 24 hours) at 02/07/16 1421 Last data filed at 02/07/16 1359  Gross per 24 hour  Intake             1161 ml  Output              600 ml  Net              561 ml     Physical Exam: General: NAD  HEENT Anicteric OM dry  Neck supple  Pulm/lungs Decreased breath sounds at bases  CVS/Heart irregular  Abdomen:  Soft, NT, J tube in place  Extremities: Trace edema  Neurologic: Alert, follows commands  Skin: Warm dry  Access: Rt IJ temp cath   Foley in place - dark urine    Basic Metabolic Panel:   Recent Labs Lab 02/01/16 0332  02/03/16 0446 02/04/16 0515 02/05/16 0538 02/06/16 0528 02/07/16 0515 02/07/16 1000  NA 138  < > 141 142 139 137 134*  --   K 4.5  < > 4.6 4.9 4.8 5.1 5.0  --   CL 107  < > 103 102 99* 97* 95*  --   CO2 21*  < > 29 27 30 29 29   --   GLUCOSE 157*  < > 128* 154* 142* 152* 138*  --   BUN 71*  < > 76* 93* 70* 91* 103*  --   CREATININE 4.56*  < > 4.08* 3.97* 3.51* 3.92* 4.58*  --   CALCIUM 8.0*  < > 8.4* 8.5* 8.6* 8.4* 8.2*  --   MG 2.5*  --   --   --   --   --   --   --   PHOS 5.3*  --   --   --   --   --   --  7.4*  < > = values in this interval not displayed.   CBC:  Recent Labs Lab 01/31/16 1956  02/02/16 0330  02/03/16 0446 02/04/16 0515 02/06/16 0528 02/07/16 0515  WBC 11.0*  < > 14.3* 13.7* 15.5* 13.2* 12.8*  NEUTROABS 9.1*  --   --   --   --   --   --   HGB 7.7*  < > 8.2* 8.6* 9.0* 8.6* 8.0*  HCT 24.2*  < > 25.1* 25.8* 27.4* 26.8* 24.9*  MCV 89.2  < > 87.1 88.5 89.3 89.4 88.2  PLT 263  < > 266 265 285 302 272  < > = values in this interval not displayed.    Microbiology:  Recent Results (from the past 720 hour(s))  Blood culture (routine x 2)     Status: None   Collection  Time: 01/31/16  8:00 PM  Result Value Ref Range Status   Specimen Description BLOOD  LEFT HAND  Final   Special Requests   Final    BOTTLES DRAWN AEROBIC AND ANAEROBIC  AER 3CC ANA 2CC   Culture NO GROWTH 5 DAYS  Final   Report Status 02/05/2016 FINAL  Final  Blood culture (routine x 2)     Status: None   Collection Time: 01/31/16  8:03 PM  Result Value Ref Range Status   Specimen Description BLOOD  R WRIST  Final   Special Requests BOTTLES DRAWN AEROBIC AND ANAEROBIC  AER4CC ANA3CC  Final   Culture NO GROWTH 5 DAYS  Final   Report Status 02/05/2016 FINAL  Final  Urine culture     Status: Abnormal   Collection Time: 01/31/16  9:36 PM  Result Value Ref Range Status   Specimen Description URINE, CLEAN CATCH  Final   Special Requests NONE  Final   Culture (A)  Final    >=100,000 COLONIES/mL METHICILLIN RESISTANT STAPHYLOCOCCUS AUREUS 50,000 COLONIES/mL ESCHERICHIA COLI    Report Status 02/04/2016 FINAL  Final   Organism ID, Bacteria METHICILLIN RESISTANT STAPHYLOCOCCUS AUREUS (A)  Final   Organism ID, Bacteria ESCHERICHIA COLI (A)  Final      Susceptibility   Escherichia coli - MIC*    AMPICILLIN 4 SENSITIVE Sensitive     CEFAZOLIN <=4 SENSITIVE Sensitive     CEFTRIAXONE <=1 SENSITIVE Sensitive     CIPROFLOXACIN >=4 RESISTANT Resistant     GENTAMICIN <=1 SENSITIVE Sensitive     IMIPENEM <=0.25 SENSITIVE Sensitive     NITROFURANTOIN <=16 SENSITIVE Sensitive     TRIMETH/SULFA <=20 SENSITIVE Sensitive      AMPICILLIN/SULBACTAM <=2 SENSITIVE Sensitive     PIP/TAZO <=4 SENSITIVE Sensitive     Extended ESBL NEGATIVE Sensitive     * 50,000 COLONIES/mL ESCHERICHIA COLI   Methicillin resistant staphylococcus aureus - MIC*    CIPROFLOXACIN >=8 RESISTANT Resistant     GENTAMICIN <=0.5 SENSITIVE Sensitive     NITROFURANTOIN <=16 SENSITIVE Sensitive     OXACILLIN >=4 RESISTANT Resistant     TETRACYCLINE <=1 SENSITIVE Sensitive     VANCOMYCIN 1 SENSITIVE Sensitive     TRIMETH/SULFA <=10 SENSITIVE Sensitive     CLINDAMYCIN <=0.25 SENSITIVE Sensitive     RIFAMPIN <=0.5 SENSITIVE Sensitive     Inducible Clindamycin NEGATIVE Sensitive     * >=100,000 COLONIES/mL METHICILLIN RESISTANT STAPHYLOCOCCUS AUREUS  MRSA PCR Screening     Status: Abnormal   Collection Time: 02/01/16 12:52 AM  Result Value Ref Range Status   MRSA by PCR POSITIVE (A) NEGATIVE Final    Comment:        The GeneXpert MRSA Assay (FDA approved for NASAL specimens only), is one component of a comprehensive MRSA colonization surveillance program. It is not intended to diagnose MRSA infection nor to guide or monitor treatment for MRSA infections. RESULT CALLED TO, READ BACK BY AND VERIFIED WITH: ALISA RAWLINS @ [id number] ON 02/01/2016 BY CAF   CULTURE, BLOOD (ROUTINE X 2) w Reflex to ID Panel     Status: None (Preliminary result)   Collection Time: 02/04/16  1:10 PM  Result Value Ref Range Status   Specimen Description BLOOD  Final   Special Requests NONE  Final   Culture NO GROWTH 2 DAYS  Final   Report Status PENDING  Incomplete  CULTURE, BLOOD (ROUTINE X 2) w Reflex to ID Panel     Status: None (Preliminary  result)   Collection Time: 02/04/16  1:10 PM  Result Value Ref Range Status   Specimen Description BLOOD  Final   Special Requests NONE  Final   Culture NO GROWTH 2 DAYS  Final   Report Status PENDING  Incomplete    Coagulation Studies: No results for input(s): LABPROT, INR in the last 72 hours.  Urinalysis: No  results for input(s): COLORURINE, LABSPEC, PHURINE, GLUCOSEU, HGBUR, BILIRUBINUR, KETONESUR, PROTEINUR, UROBILINOGEN, NITRITE, LEUKOCYTESUR in the last 72 hours.  Invalid input(s): APPERANCEUR    Imaging: No results found.   Medications:     . chlorhexidine  15 mL Mouth Rinse BID  . mouth rinse  15 mL Mouth Rinse q12n4p  . predniSONE  30 mg Oral Q breakfast   sodium chloride, LORazepam, morphine CONCENTRATE  Assessment/ Plan:  75 y.o. 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 Elyse Hsu on 02/07/2016.   1. ARF, oliguric 2. CKD st 3, Baseline Cr 2.0 From 11/23/15. GFR 33.  3. Solitary left Kidney 4. Low grade Ta bladder cancer with left renal pelvic mass s/p multiple ablations 5. Acute pulmonary edema 6. Anemia, requiring blood transfusion 7. NSTEMI/ ACS 8. MRSA, E Coli UTI  ARF is likely multifactorial, ATN. Renal U.S is neg for obstruction. Bactrim use from week prior to admission could have contributed to ARF  - overall renal function remains quite poor.  BUN and creatinine were increased over the weekend.  Urine output remains quite poor at 125 cc over the preceding 24 hours.  Given her multiple comorbidities including bladder cancer, generalized debility, chronic kidney disease stage III the patient would not make a good outpatient dialysis candidate.  For now we will continue periodic dialysis.  However we would recommend palliative care evaluation.     LOS: 7 Matthew Poole 9/25/20172:21 PM

## 2016-02-07 NOTE — Progress Notes (Signed)
Matthew Poole at [place] NAME: Matthew Poole    MR#:  [id number]  DATE OF BIRTH:  03-01-1941  SUBJECTIVE: Patient denies any complaints.   CHIEF COMPLAINT:   Chief Complaint  Patient presents with  . Shortness of Breath   Admitted with shortness of breath, CHF and acute Poole injury. Was on a BiPAP and now transitioned to nasal cannula. Transferred out of ICU.  No concerns. Patient has cognitive impairment limiting history. No complaint, saw the pt during HD today.  REVIEW OF SYSTEMS:  Unable to give ROS due to baseline mental status. ROS  DRUG ALLERGIES:   Allergies  Allergen Reactions  . Penicillins Itching, Nausea And Vomiting, Rash and Other (See Comments)    Unable to obtain enough information to answer additional questions about this medication.      VITALS:  Blood pressure 121/78, pulse (!) 103, temperature 97.4 F (36.3 C), temperature source Oral, resp. rate 19, height 6' (1.829 m), weight 136 lb 11 oz (62 kg), SpO2 95 %.  PHYSICAL EXAMINATION:  GENERAL:  75 y.o.-year-old patient lying in the bed with no acute distress.  EYES: Pupils equal, round, reactive to light and accommodation. No scleral icterus. Extraocular muscles intact.  HEENT: Head atraumatic, normocephalic. Dry oral mucosa.  NECK:  Supple, no jugular venous distention. No thyroid enlargement, no tenderness.  LUNGS: Normal breath sounds bilaterally, no wheezing, b/l crepitation. No use of accessory muscles of respiration.  CARDIOVASCULAR: S1, S2 normal. No murmurs, rubs, or gallops.  ABDOMEN: Soft, nontender, nondistended. Bowel sounds present. No organomegaly or mass.  Foley in place with hematuria EXTREMITIES: bilateral leg edema, no cyanosis, or clubbing.  NEUROLOGIC: Cranial nerves II through XII are intact. Muscle strength 3/5 in all extremities. Sensation intact. Gait not checked.  PSYCHIATRIC: The patient is demented. SKIN: No obvious rash, lesion, or  ulcer.   Physical Exam LABORATORY PANEL:   CBC  Recent Labs Lab 02/07/16 0515  WBC 12.8*  HGB 8.0*  HCT 24.9*  PLT 272   ------------------------------------------------------------------------------------------------------------------  Chemistries   Recent Labs Lab 01/31/16 1956 02/01/16 0332  02/07/16 0515  NA 138 138  < > 134*  K 4.5 4.5  < > 5.0  CL 105 107  < > 95*  CO2 21* 21*  < > 29  GLUCOSE 293* 157*  < > 138*  BUN 70* 71*  < > 103*  CREATININE 4.63* 4.56*  < > 4.58*  CALCIUM 8.0* 8.0*  < > 8.2*  MG  --  2.5*  --   --   AST 24  --   --   --   ALT 11*  --   --   --   ALKPHOS 99  --   --   --   BILITOT 0.5  --   --   --   < > = values in this interval not displayed. ------------------------------------------------------------------------------------------------------------------  Cardiac Enzymes  Recent Labs Lab 02/01/16 0332 02/01/16 1138  TROPONINI 1.30* 1.32*   ------------------------------------------------------------------------------------------------------------------  RADIOLOGY:  No results found.  ASSESSMENT AND PLAN:   Active Problems:   Acute respiratory failure ([place])   Palliative care encounter   DNR (do not resuscitate)   Dyspnea  * Acute hypoxic respiratory failure   Due to CHF exacerbation.    Much improved after one session of HD.    saturation 97% on room air today. Nephrology to decide on dialysis as needed.  ;  * Ac on ch renal failure,  hematuria, UTI; patient has already chronic renal failure, chronic Poole disease stage III: Baseline creatinine of 2. 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 Nephrology is following for his dialysis needs, urine out put is still   not good. Patient's family is aware that he is not a good candidate for long-term dialysis secondary to multiple comorbidities.   Has chronic urological issues     [facility]  * Sepsis- resolved. Patient has MRSA UTI: Isolation, on clindamycin. improved.  * Acute on chronic systolic CHF, acute pulmonary edema in the contest of acute renal failure, requiring the dialysis sessions because of shortness of breath, oliguria, pulmonary edema.  Ef 25 % ac systolic No Lasix at this time due to acute Poole injury  * Acute on chronic anemia with hematuira   Transfused one unit in ICU   * Elevated troponin,   With significant hematuria, not a candidate for anticoagulation.   Likely this may be due to stress of CHF and renal failure with infection.  Poor long-term prognosis. Palliative care consulted.  75 Guardian decided comfort care after discussion with Matthew Poole, palliative care NP.  Possible discharge to hospice home tomorrow.    All the records are reviewed and case discussed with Matthew Poole, palliative care NP, RN, Care Management/Social Workerr. Management plans discussed with the patient, family and they are in agreement.  CODE STATUS: DNR  TOTAL TIME TAKING CARE OF THIS PATIENT: 35 minutes.   POSSIBLE D/C IN tomorrow, DEPENDING ON CLINICAL CONDITION.  Matthew Poole M.D on 02/07/2016   Between 7am to 6pm - Pager - [phone]  After 6pm go to [email or web address] - password [email or web address] [facility]  Office  [phone]  CC: Primary care physician; Matthew Harrier, MD  Note: This dictation was prepared with Dragon dictation along with smaller phrase technology. Any transcriptional errors that result from this process are unintentional.

## 2016-02-07 NOTE — Progress Notes (Signed)
New hospice home referral received from [facility] following a Palliative Medicine consult. Mr. Caven is a 75 year old man with a past medical history of renal cell cancer, s/p multiple ablations and right nephrectomy, chronic indewlling urinary catheter, COPD, CHF w/EF of 25-30 %, CKD stage III, GERD, mental retardation and dysphasia w/PEG placement admitted [facility]18 with shortness of breath and acute renal failure requiring Bipap and hemodialysis treatment. He was also given 1 unit of PRBC's r/t chronic hematuria and has required 2 dialysis treatments. Per nephrology note patient is not a good candidate for long term dialysis d/t his multiple co- morbidities. Cardiology note review references a NONSTEMI based on elevated troponin. Palliative Medicine NP's Vinie Sill and Wadie Lessen have had conversations with Mr. Payne guardian Mr. Nelly Laurence who is in agreement with a focus on comfort for Mr. Malarkey and wishes for him to transfer to the Hospice home for end of life care.. There will be no further dialysis or tube feedings. He is now to receive ice chips and sips of liquids per Palliative Medicine orders. Writer spoke on the phone with Mr. Nelly Laurence to initiate education regarding hospice services, philosophy and team approach to care with good understanding voiced. Mr. Durenda Age wife was cared for and died at the hospice home in 06-14-2022 of this year. Per request of [name] consents to be signed at the hospice home tomorrow, arrangements made.  Patient seen sitting up in the recliner in his room, he appeared restless, able to answer simple yes/no questions, oriented to self and place. He answered no to most questions. Writer fed him 3 ice chips one at a time, he appeared to swallow with out difficulty until aprox. 5 minutes later when he began coughing and brought up thick brown/blood tinged  phlegm. His work of breathing increased as well. Staff RN Eliberto Ivory present, discussed  order for liquid morphine, NP Wadie Lessen contacted and placed an order for liquid morphine 5 mg sublingual q 6 hours scheduled. PRN dose also in place. Patient cleaned, mouth care given, oxygen in place at 2 liters via nasal cannula, sitting up in the recliner at the end of the visit. Patient information faxed to hospice referral. Plan is for discharge to the hospice home tomorrow 9/26 via EMS with portable DNR in place. Hospital care team and guardian aware and in agreement. Thank you for the opportunity to be involved in the care of this patient. Flo Shanks RN, BSN, [place] and [facility] liaison [phone] c

## 2016-02-07 NOTE — Progress Notes (Signed)
Dialysis started 

## 2016-02-08 MED ORDER — LORAZEPAM 1 MG PO TABS: 1.0000 mg | ORAL_TABLET | ORAL | 0 refills | Status: AC | PRN

## 2016-02-08 MED ORDER — MORPHINE SULFATE (CONCENTRATE) 10 MG/0.5ML PO SOLN: 5.0000 mg | Freq: Four times a day (QID) | ORAL | 0 refills | Status: AC

## 2016-02-08 MED ORDER — MORPHINE SULFATE (CONCENTRATE) 10 MG/0.5ML PO SOLN: 5.0000 mg | ORAL | 0 refills | Status: AC | PRN

## 2016-02-08 NOTE — Progress Notes (Signed)
Follow up visit made to new hospice home referral. Patient seen sitting up in his recliner, scheduled dose of liquid morphine ab out to be given. Writer spoke with attending physician Dr. Bridgett Larsson and staff RN Santiago Glad, patient is ready for discharge. Updated notes faxed to hospice referral, report called to the hospice home, EMS notified for transport. Signed DNR in place in discharge packet. Thank you. Flo Shanks RN, BSN, [place] and [facility] [place], [facility] [phone] c

## 2016-02-08 NOTE — Progress Notes (Signed)
Patient discharged to hospice as ordered. EMS at the bedside to take patient. Left dialysis cathete discontinued lumen intact. Pressure held at site until bleeding sopped. Dressing applied as ordered. Hospice nurse arranged discharge. Patient alert to self, no acute distress noted. Vital signs stable patient on 2 liters per nasal cannula. Oxygen saturation 96%.

## 2016-02-08 NOTE — Discharge Summary (Signed)
[facility] [place] NAME: Matthew Poole    MR#:  [id number]  DATE OF BIRTH:  11-04-1948  DATE OF ADMISSION:  01/31/2016   ADMITTING PHYSICIAN: Vilinda Boehringer, MD  DATE OF DISCHARGE: 02/08/2016  PRIMARY CARE PHYSICIAN: [name], MD   ADMISSION DIAGNOSIS:  SOB (shortness of breath) [R06.02] Acute renal failure, unspecified acute renal failure type ([place]) [N17.9] DISCHARGE DIAGNOSIS:  Active Problems:   Acute respiratory failure ([place])   Palliative care encounter   DNR (do not resuscitate)   Dyspnea * Acute hypoxic respiratory failure   Due to CHF exacerbation. * Acute on chronic systolic CHF, * Ac on ch renal failure, hematuria, UTI * Sepsis- resolved. Patient has MRSA UTI SECONDARY DIAGNOSIS:   Past Medical History:  Diagnosis Date  . Anemia   . Cancer (HCC)    renal cell carcinoma  . COPD (chronic obstructive pulmonary disease) ([place])   . Depression   . Difficulty walking   . Dysphagia, oropharyngeal   . G tube feedings ([place])   . GERD (gastroesophageal reflux disease)   . Mental retardation   . Muscle weakness   . Osteoporosis   . Psoriasis   . Psoriasis   . Reflux   . Renal cell carcinoma ([place])   . Renal disorder    acute on chronic renal disorder  . Sepsis secondary to UTI [facility])    HOSPITAL COURSE:  * Acute hypoxic respiratory failure   Due to CHF exacerbation.    Much improved after one session of HD.    saturation 97% on room air today. Nephrology to decide on dialysis as needed.  * Ac on ch renal failure, hematuria, UTI; patient has already chronic renal failure, chronic kidney disease stage III: Baseline creatinine of 2. 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 Nephrology is following for his dialysis needs, urine out put is still   not good. Patient's family is aware that he is not a good candidate for long-term  dialysis secondary to multiple comorbidities.   Has chronic urological issues    [facility]rolog[facility]  * Sepsis- resolved. Patient has MRSA UTI: Isolation, on clindamycin. improved.  * Acute on chronic systolic CHF, acute pulmonary edema in the contest of acute renal failure, requiring the dialysis sessions because of shortness of breath, oliguria, pulmonary edema.  Ef 25 % ac systolic No Lasix at this time due to acute kidney injury  * Acute on chronic anemia with hematuira   Transfused one unit in ICU   * Elevated troponin,   With significant hematuria, not a candidate for anticoagulation.   Likely this may be due to stress of CHF and renal failure with infection.  Poor long-term prognosis. Palliative care consulted.  Guardian decided comfort care after discussion with [name], palliative care NP.  Discharge to hospice home today.  CODE STATUS: DNR  DISCHARGE CONDITIONS:  Poor prognosis, discharge to hospice home today. CONSULTS OBTAINED:  Treatment Team:  Vaughan Basta, MD Murlean Iba, MD Teodoro Spray, MD Corey Skains, MD DRUG ALLERGIES:   Allergies  Allergen Reactions  . Penicillins Itching, Nausea And Vomiting, Rash and Other (See Comments)    Unable to obtain enough information to answer additional questions about this medication.     DISCHARGE MEDICATIONS:     Medication List    STOP taking  these medications   4X PROBIOTIC PO   acetaminophen 325 MG tablet Commonly known as:  TYLENOL   albuterol (2.5 MG/3ML) 0.083% nebulizer solution Commonly known as:  PROVENTIL   chlorhexidine 0.12 % solution Commonly known as:  PERIDEX   cholecalciferol 1000 units tablet Commonly known as:  VITAMIN D   ciprofloxacin 250 MG tablet Commonly known as:  CIPRO   docusate sodium 100 MG capsule Commonly known as:  COLACE   doxazosin 2 MG tablet Commonly known as:  CARDURA     feeding supplement (JEVITY 1.5 CAL) Liqd   ferrous sulfate 325 (65 FE) MG tablet   loperamide 2 MG capsule Commonly known as:  IMODIUM   multivitamin with iron-minerals liquid   omeprazole 20 MG capsule Commonly known as:  PRILOSEC   oxybutynin 5 MG tablet Commonly known as:  DITROPAN   senna 8.6 MG Tabs tablet Commonly known as:  SENOKOT   sertraline 50 MG tablet Commonly known as:  ZOLOFT   sodium bicarbonate 325 MG tablet   sulfamethoxazole-trimethoprim 400-80 MG tablet Commonly known as:  BACTRIM,SEPTRA   triamcinolone cream 0.1 % Commonly known as:  KENALOG   vitamin C 250 MG tablet Commonly known as:  ASCORBIC ACID     TAKE these medications   LORazepam 1 MG tablet Commonly known as:  ATIVAN Take 1 tablet (1 mg total) by mouth every 4 (four) hours as needed for anxiety.   morphine CONCENTRATE 10 MG/0.5ML Soln concentrated solution Take 0.25 mLs (5 mg total) by mouth every hour as needed for moderate pain, severe pain or shortness of breath.   morphine CONCENTRATE 10 MG/0.5ML Soln concentrated solution Take 0.25 mLs (5 mg total) by mouth every 6 (six) hours.        DISCHARGE INSTRUCTIONS:   DIET:  regular DISCHARGE CONDITION:  Poor. ACTIVITY:  Comfort care. DISCHARGE LOCATION:   If you experience worsening of your admission symptoms, develop shortness of breath, life threatening emergency, suicidal or homicidal thoughts you must seek medical attention immediately by calling 911 or calling your MD immediately  if symptoms less severe.  You Must read complete instructions/literature along with all the possible adverse reactions/side effects for all the Medicines you take and that have been prescribed to you. Take any new Medicines after you have completely understood and accpet all the possible adverse reactions/side effects.   Please note  You were cared for by a hospitalist during your hospital stay. If you have any questions about your discharge  medications or the care you received while you were in the hospital after you are discharged, you can call the unit and asked to speak with the hospitalist on call if the hospitalist that took care of you is not available. Once you are discharged, your primary care physician will handle any further medical issues. Please note that NO REFILLS for any discharge medications will be authorized once you are discharged, as it is imperative that you return to your primary care physician (or establish a relationship with a primary care physician if you do not have one) for your aftercare needs so that they can reassess your need for medications and monitor your lab values.    On the day of Discharge:  VITAL SIGNS:  Blood pressure 121/78, pulse (!) 103, temperature 97.4 F (36.3 C), temperature source Oral, resp. rate 19, height 6' (1.829 m), weight 136 lb 11 oz (62 kg), SpO2 95 %. PHYSICAL EXAMINATION:  GENERAL:  75 y.o.-year-old patient sitting in chair with  no acute distress.  EYES:  No scleral icterus. Extraocular muscles intact.  HEENT: Head atraumatic, normocephalic. Dry oral mucosa.  NECK:  Supple, no jugular venous distention. No thyroid enlargement, no tenderness.  LUNGS: Normal breath sounds bilaterally, no wheezing, rales,rhonchi or crepitation. No use of accessory muscles of respiration.  CARDIOVASCULAR: S1, S2 normal. No murmurs, rubs, or gallops.  ABDOMEN: Soft, non-tender, non-distended. Bowel sounds present. No organomegaly or mass.  EXTREMITIES: bilateral leg edema, no cyanosis, or clubbing.  NEUROLOGIC: Muscle strength 4/5 in all extremities. Sensation intact. Gait not checked.  PSYCHIATRIC: The patient is demented.Marland Kitchen  SKIN: No obvious rash, lesion, or ulcer.  DATA REVIEW:   CBC  Recent Labs Lab 02/07/16 0515  WBC 12.8*  HGB 8.0*  HCT 24.9*  PLT 272    Chemistries   Recent Labs Lab 02/07/16 0515  NA 134*  K 5.0  CL 95*  CO2 29  GLUCOSE 138*  BUN 103*  CREATININE  4.58*  CALCIUM 8.2*     Microbiology Results  Results for orders placed or performed during the hospital encounter of 01/31/16  Blood culture (routine x 2)     Status: None   Collection Time: 01/31/16  8:00 PM  Result Value Ref Range Status   Specimen Description BLOOD  LEFT HAND  Final   Special Requests   Final    BOTTLES DRAWN AEROBIC AND ANAEROBIC  AER 3CC ANA 2CC   Culture NO GROWTH 5 DAYS  Final   Report Status 02/05/2016 FINAL  Final  Blood culture (routine x 2)     Status: None   Collection Time: 01/31/16  8:03 PM  Result Value Ref Range Status   Specimen Description BLOOD  R WRIST  Final   Special Requests BOTTLES DRAWN AEROBIC AND ANAEROBIC  AER4CC ANA3CC  Final   Culture NO GROWTH 5 DAYS  Final   Report Status 02/05/2016 FINAL  Final  Urine culture     Status: Abnormal   Collection Time: 01/31/16  9:36 PM  Result Value Ref Range Status   Specimen Description URINE, CLEAN CATCH  Final   Special Requests NONE  Final   Culture (A)  Final    >=100,000 COLONIES/mL METHICILLIN RESISTANT STAPHYLOCOCCUS AUREUS 50,000 COLONIES/mL ESCHERICHIA COLI    Report Status 02/04/2016 FINAL  Final   Organism ID, Bacteria METHICILLIN RESISTANT STAPHYLOCOCCUS AUREUS (A)  Final   Organism ID, Bacteria ESCHERICHIA COLI (A)  Final      Susceptibility   Escherichia coli - MIC*    AMPICILLIN 4 SENSITIVE Sensitive     CEFAZOLIN <=4 SENSITIVE Sensitive     CEFTRIAXONE <=1 SENSITIVE Sensitive     CIPROFLOXACIN >=4 RESISTANT Resistant     GENTAMICIN <=1 SENSITIVE Sensitive     IMIPENEM <=0.25 SENSITIVE Sensitive     NITROFURANTOIN <=16 SENSITIVE Sensitive     TRIMETH/SULFA <=20 SENSITIVE Sensitive     AMPICILLIN/SULBACTAM <=2 SENSITIVE Sensitive     PIP/TAZO <=4 SENSITIVE Sensitive     Extended ESBL NEGATIVE Sensitive     * 50,000 COLONIES/mL ESCHERICHIA COLI   Methicillin resistant staphylococcus aureus - MIC*    CIPROFLOXACIN >=8 RESISTANT Resistant     GENTAMICIN <=0.5 SENSITIVE  Sensitive     NITROFURANTOIN <=16 SENSITIVE Sensitive     OXACILLIN >=4 RESISTANT Resistant     TETRACYCLINE <=1 SENSITIVE Sensitive     VANCOMYCIN 1 SENSITIVE Sensitive     TRIMETH/SULFA <=10 SENSITIVE Sensitive     CLINDAMYCIN <=0.25 SENSITIVE Sensitive  RIFAMPIN <=0.5 SENSITIVE Sensitive     Inducible Clindamycin NEGATIVE Sensitive     * >=100,000 COLONIES/mL METHICILLIN RESISTANT STAPHYLOCOCCUS AUREUS  MRSA PCR Screening     Status: Abnormal   Collection Time: 02/01/16 12:52 AM  Result Value Ref Range Status   MRSA by PCR POSITIVE (A) NEGATIVE Final    Comment:        The GeneXpert MRSA Assay (FDA approved for NASAL specimens only), is one component of a comprehensive MRSA colonization surveillance program. It is not intended to diagnose MRSA infection nor to guide or monitor treatment for MRSA infections. RESULT CALLED TO, READ BACK BY AND VERIFIED WITH: ALISA RAWLINS @ [id number] ON 02/01/2016 BY CAF   CULTURE, BLOOD (ROUTINE X 2) w Reflex to ID Panel     Status: None (Preliminary result)   Collection Time: 02/04/16  1:10 PM  Result Value Ref Range Status   Specimen Description BLOOD  Final   Special Requests NONE  Final   Culture NO GROWTH 2 DAYS  Final   Report Status PENDING  Incomplete  CULTURE, BLOOD (ROUTINE X 2) w Reflex to ID Panel     Status: None (Preliminary result)   Collection Time: 02/04/16  1:10 PM  Result Value Ref Range Status   Specimen Description BLOOD  Final   Special Requests NONE  Final   Culture NO GROWTH 2 DAYS  Final   Report Status PENDING  Incomplete    RADIOLOGY:  No results found.   Management plans discussed with the patient, family and they are in agreement.  CODE STATUS:     Code Status Orders        Start     Ordered   02/01/16 0927  Do not attempt resuscitation (DNR)  Continuous    Question Answer Comment  In the event of cardiac or respiratory ARREST Do not call a "code blue"   In the event of cardiac or respiratory  ARREST Do not perform Intubation, CPR, defibrillation or ACLS   In the event of cardiac or respiratory ARREST Use medication by any route, position, wound care, and other measures to relive pain and suffering. May use oxygen, suction and manual treatment of airway obstruction as needed for comfort.      02/01/16 0926    Code Status History    Date Active Date Inactive Code Status Order ID Comments User Context   01/31/2016 10:31 PM 02/01/2016  8:56 AM Full Code [id number]  Holley Raring, NP ED   04/05/2015  5:07 PM 04/07/2015  6:17 PM Full Code [id number]  [name], MD Inpatient   12/25/2014  2:54 PM 12/29/2014  5:42 PM Full Code [id number]  Vaughan Basta, MD Inpatient      TOTAL TIME TAKING CARE OF THIS PATIENT: 33 minutes.    [name] M.D on 02/08/2016 at 10:20 AM  Between 7am to 6pm - Pager - [phone]  After 6pm go to [email or web address] - Proofreader  [facility]  Office  [phone]  CC: Primary care physician; [name], MD   Note: This dictation was prepared with Dragon dictation along with smaller phrase technology. Any transcriptional errors that result from this process are unintentional.

## 2016-02-08 NOTE — Discharge Instructions (Signed)
Comfort care  °

## 2016-02-08 NOTE — Clinical Social Work Note (Addendum)
Patient to be d/c'ed today to [facility].  Patient and family agreeable to plans will transport via ems RN to call report to [phone].  Message left on voice mail for patient's guardian that patient discharged today.  Evette Cristal, MSW Mon-Fri 8a-4:30p [phone]

## 2016-02-09 LAB — CULTURE, BLOOD (ROUTINE X 2)
CULTURE: NO GROWTH
CULTURE: NO GROWTH

## 2016-02-13 DEATH — deceased

## 2016-04-04 ENCOUNTER — Ambulatory Visit: Payer: PPO | Admitting: Podiatry

## 2016-10-05 IMAGING — RF DG SWALLOWING FUNCTION - NRPT [facility]
12 of 14 series · 13 of 24 positions shown · non-contrast
Comparison: none

[Series 1: run · 1 of 15 frames shown (1 of 12)]
[frame 3/15]
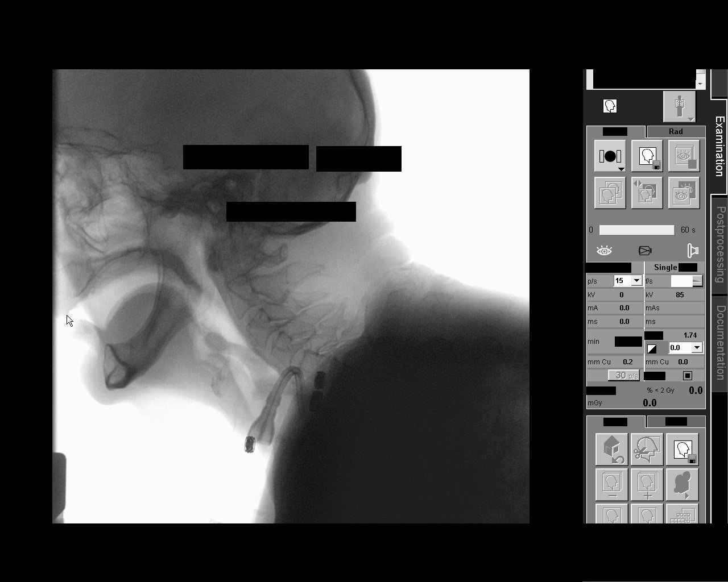

[Series 3: run · 1 of 27 frames shown (2 of 12)]
[frame 14/27]
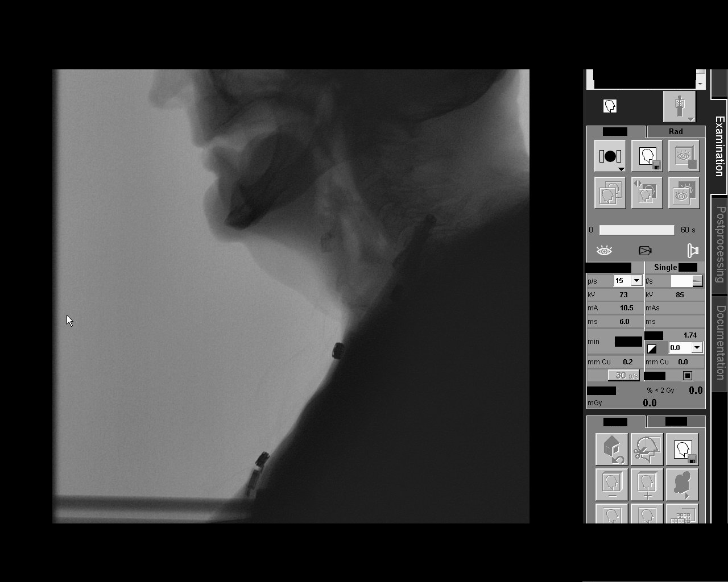

[Series 4: run · 1 of 24 frames shown (3 of 12)]
[frame 4/24]
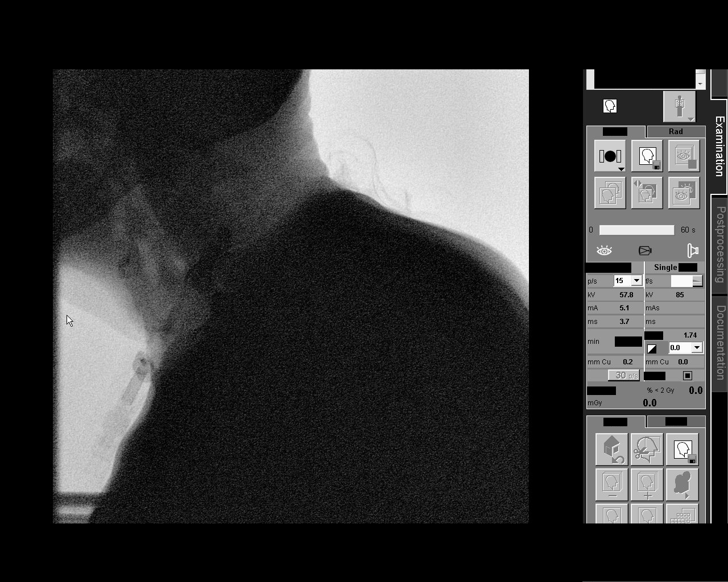

[Series 5: run · 1 of 56 frames shown (4 of 12)]
[frame 48/56]
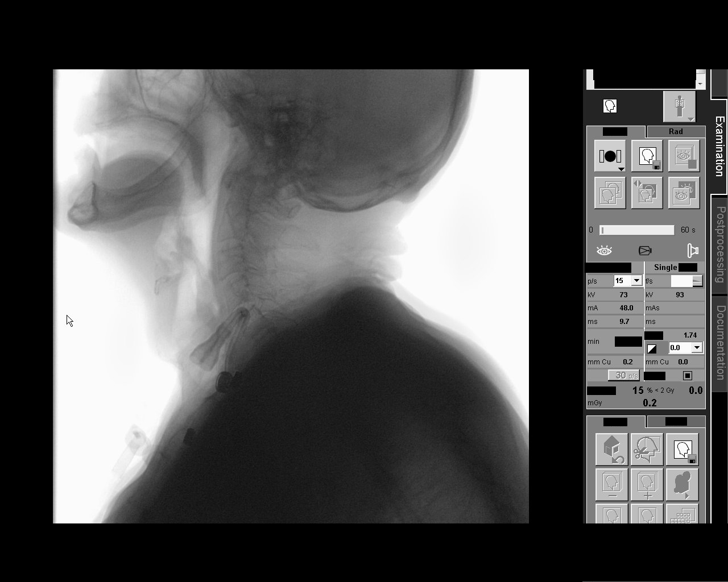

[Series 6: run · 1 of 42 frames shown (5 of 12)]
[frame 39/42]
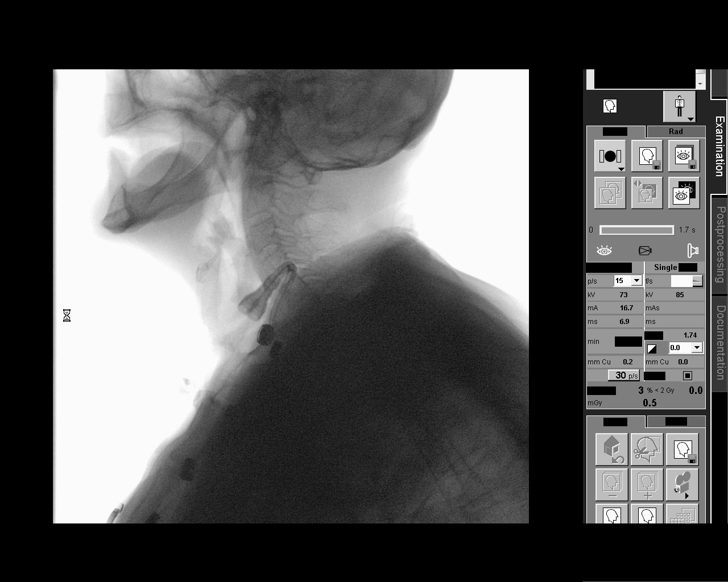

[Series 7: run · 1 of 41 frames shown (6 of 12)]
[frame 35/41]
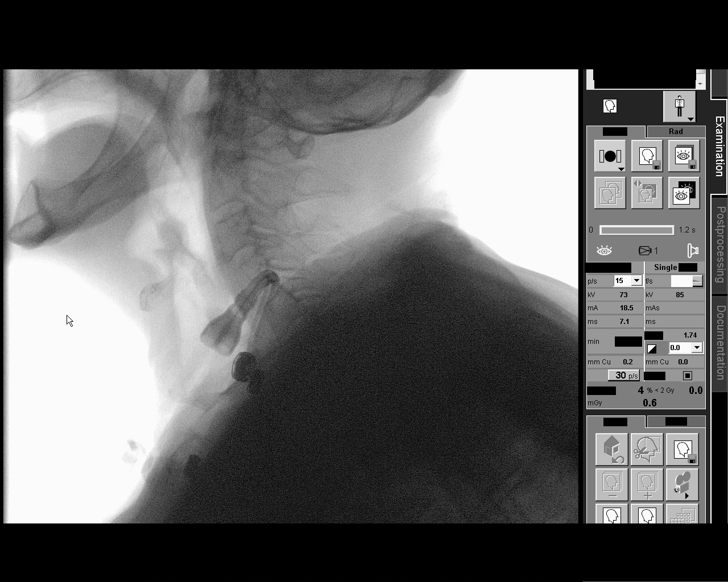

[Series 9: run · 2 of 89 frames shown (7 of 12)]
[frame 14/89]
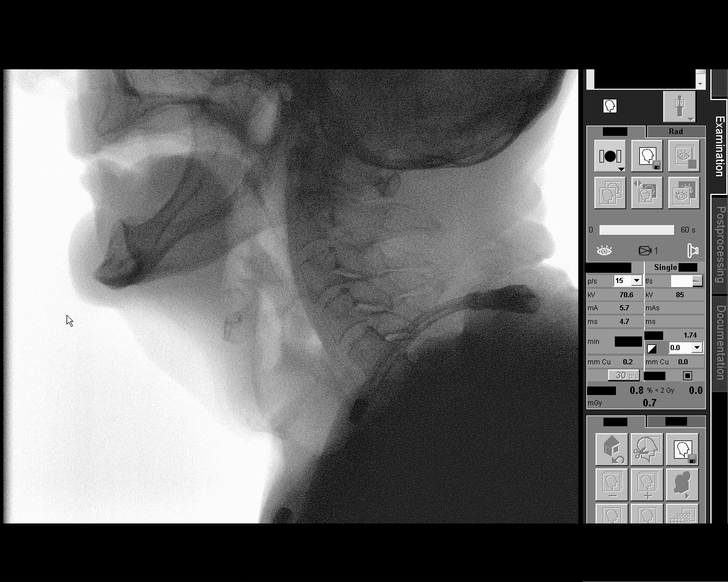
[frame 88/89]
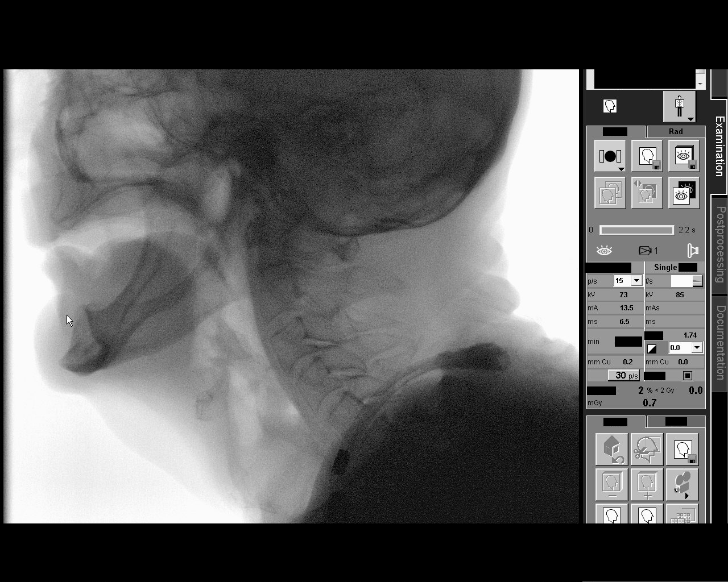

[Series 10: run · 1 of 865 frames shown (8 of 12)]
[frame 736/865]
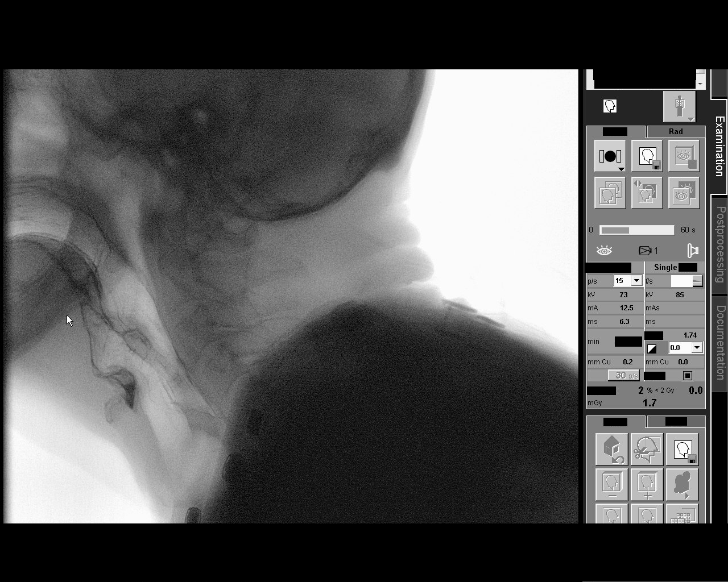

[Series 12: run · 1 of 264 frames shown (9 of 12)]
[frame 133/264]
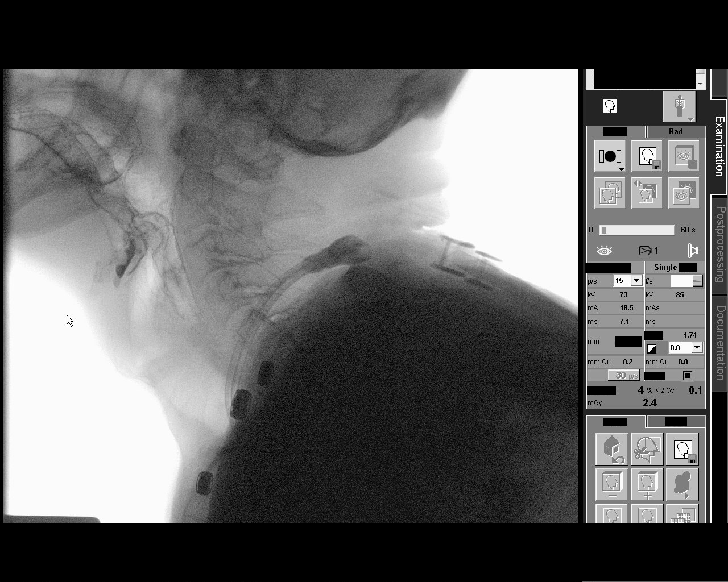

[Series 13: run · 1 of 619 frames shown (10 of 12)]
[frame 310/619]
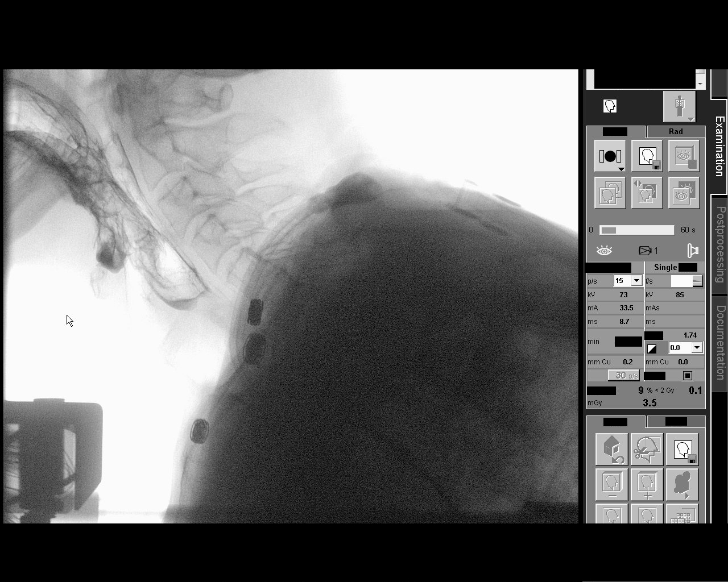

[Series 14: run · 1 of 287 frames shown (11 of 12)]
[frame 244/287]
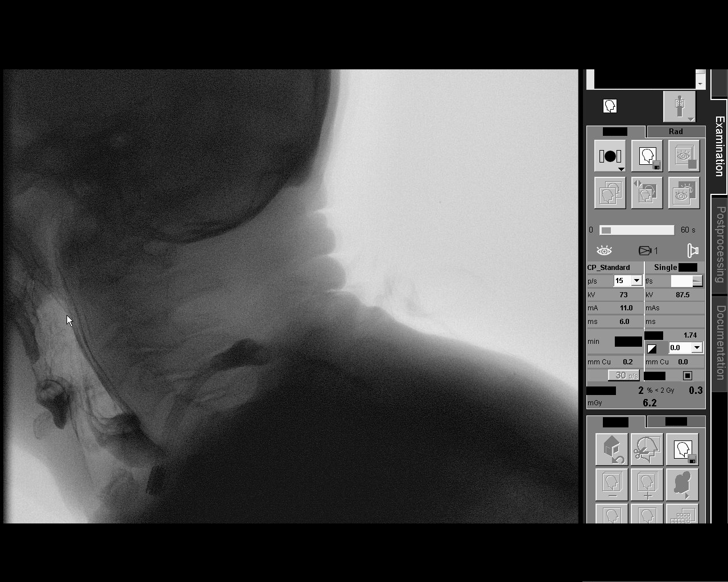

[Series 15: run · 1 of 706 frames shown (12 of 12)]
[frame 613/706]
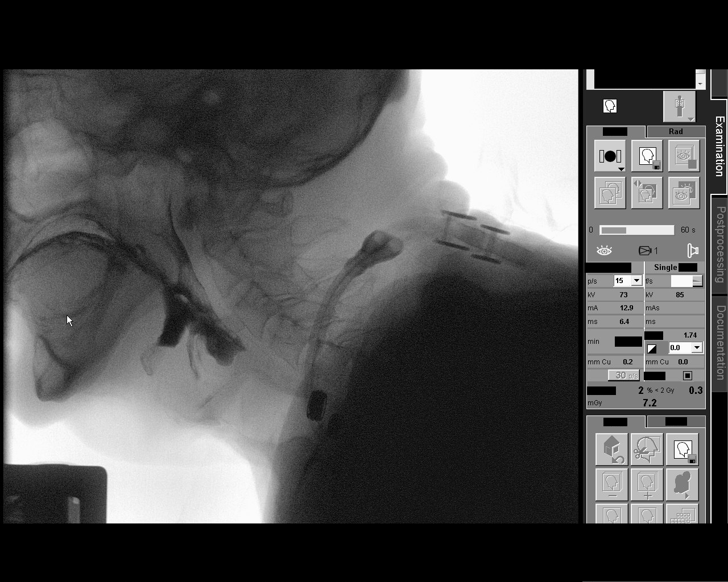

[13 of 24 positions shown; findings below may reference images not displayed]

FLUOROSCOPY FOR SWALLOWING FUNCTION STUDY:
Fluoroscopy was provided for swallowing function study, which was administered by a speech pathologist.  Final results and recommendations from this study are contained within the speech pathology report.
